# Patient Record
Sex: Female | Born: 1955 | Race: White | Hispanic: No | Marital: Married | Smoking: Current every day smoker
Health system: Southern US, Community
[De-identification: ages and names within clinical notes are randomized; demographics above are authoritative.]

## PROBLEM LIST (undated history)

## (undated) DIAGNOSIS — M1990 Unspecified osteoarthritis, unspecified site: Secondary | ICD-10-CM

## (undated) DIAGNOSIS — K219 Gastro-esophageal reflux disease without esophagitis: Secondary | ICD-10-CM

## (undated) DIAGNOSIS — Z860100 Personal history of colon polyps, unspecified: Secondary | ICD-10-CM

## (undated) DIAGNOSIS — I341 Nonrheumatic mitral (valve) prolapse: Secondary | ICD-10-CM

## (undated) DIAGNOSIS — T7840XA Allergy, unspecified, initial encounter: Secondary | ICD-10-CM

## (undated) DIAGNOSIS — Z720 Tobacco use: Secondary | ICD-10-CM

## (undated) DIAGNOSIS — I10 Essential (primary) hypertension: Secondary | ICD-10-CM

## (undated) DIAGNOSIS — Z923 Personal history of irradiation: Secondary | ICD-10-CM

## (undated) DIAGNOSIS — F419 Anxiety disorder, unspecified: Secondary | ICD-10-CM

## (undated) DIAGNOSIS — K222 Esophageal obstruction: Secondary | ICD-10-CM

## (undated) DIAGNOSIS — J449 Chronic obstructive pulmonary disease, unspecified: Secondary | ICD-10-CM

## (undated) DIAGNOSIS — E785 Hyperlipidemia, unspecified: Secondary | ICD-10-CM

## (undated) HISTORY — DX: Tobacco use: Z72.0

## (undated) HISTORY — PX: MOHS SURGERY: SUR867

## (undated) HISTORY — DX: Nonrheumatic mitral (valve) prolapse: I34.1

## (undated) HISTORY — DX: Allergy, unspecified, initial encounter: T78.40XA

## (undated) HISTORY — DX: Personal history of colonic polyps: Z86.010

## (undated) HISTORY — DX: Anxiety disorder, unspecified: F41.9

## (undated) HISTORY — DX: Hyperlipidemia, unspecified: E78.5

## (undated) HISTORY — DX: Chronic obstructive pulmonary disease, unspecified: J44.9

## (undated) HISTORY — PX: ESOPHAGOGASTRODUODENOSCOPY: SHX1529

## (undated) HISTORY — DX: Esophageal obstruction: K22.2

## (undated) HISTORY — PX: COLONOSCOPY: SHX174

## (undated) HISTORY — PX: BREAST LUMPECTOMY: SHX2

## (undated) HISTORY — PX: OTHER SURGICAL HISTORY: SHX169

## (undated) HISTORY — DX: Gastro-esophageal reflux disease without esophagitis: K21.9

## (undated) HISTORY — DX: Personal history of colon polyps, unspecified: Z86.0100

## (undated) HISTORY — PX: SKIN GRAFT: SHX250

---

## 1998-07-15 HISTORY — PX: BREAST BIOPSY: SHX20

## 2000-01-15 ENCOUNTER — Encounter: Admission: RE | Admit: 2000-01-15 | Discharge: 2000-01-15 | Payer: Self-pay | Admitting: Family Medicine

## 2000-01-15 ENCOUNTER — Encounter: Payer: Self-pay | Admitting: Family Medicine

## 2000-12-16 ENCOUNTER — Other Ambulatory Visit: Admission: RE | Admit: 2000-12-16 | Discharge: 2000-12-16 | Payer: Self-pay | Admitting: Family Medicine

## 2000-12-17 ENCOUNTER — Encounter: Payer: Self-pay | Admitting: Family Medicine

## 2000-12-17 ENCOUNTER — Encounter: Admission: RE | Admit: 2000-12-17 | Discharge: 2000-12-17 | Payer: Self-pay | Admitting: Family Medicine

## 2000-12-22 ENCOUNTER — Encounter: Payer: Self-pay | Admitting: Internal Medicine

## 2001-06-24 ENCOUNTER — Encounter: Payer: Self-pay | Admitting: Family Medicine

## 2001-06-24 ENCOUNTER — Encounter: Admission: RE | Admit: 2001-06-24 | Discharge: 2001-06-24 | Payer: Self-pay | Admitting: Family Medicine

## 2001-12-24 ENCOUNTER — Encounter: Payer: Self-pay | Admitting: Family Medicine

## 2001-12-24 ENCOUNTER — Encounter: Admission: RE | Admit: 2001-12-24 | Discharge: 2001-12-24 | Payer: Self-pay | Admitting: Family Medicine

## 2002-12-17 ENCOUNTER — Encounter: Admission: RE | Admit: 2002-12-17 | Discharge: 2002-12-17 | Payer: Self-pay | Admitting: Family Medicine

## 2002-12-17 ENCOUNTER — Encounter: Payer: Self-pay | Admitting: Family Medicine

## 2003-07-04 ENCOUNTER — Encounter: Admission: RE | Admit: 2003-07-04 | Discharge: 2003-07-04 | Payer: Self-pay | Admitting: Family Medicine

## 2004-04-10 ENCOUNTER — Encounter: Admission: RE | Admit: 2004-04-10 | Discharge: 2004-04-10 | Payer: Self-pay | Admitting: Family Medicine

## 2005-06-11 ENCOUNTER — Encounter: Payer: Self-pay | Admitting: Internal Medicine

## 2005-06-14 ENCOUNTER — Inpatient Hospital Stay (HOSPITAL_COMMUNITY): Admission: EM | Admit: 2005-06-14 | Discharge: 2005-06-16 | Payer: Self-pay | Admitting: Emergency Medicine

## 2005-06-15 ENCOUNTER — Encounter (INDEPENDENT_AMBULATORY_CARE_PROVIDER_SITE_OTHER): Payer: Self-pay | Admitting: Specialist

## 2006-07-15 HISTORY — PX: APPENDECTOMY: SHX54

## 2006-11-26 ENCOUNTER — Ambulatory Visit: Payer: Self-pay | Admitting: Internal Medicine

## 2006-11-27 ENCOUNTER — Encounter: Payer: Self-pay | Admitting: Internal Medicine

## 2006-11-27 ENCOUNTER — Ambulatory Visit: Payer: Self-pay | Admitting: Internal Medicine

## 2007-02-13 ENCOUNTER — Emergency Department (HOSPITAL_COMMUNITY): Admission: EM | Admit: 2007-02-13 | Discharge: 2007-02-14 | Payer: Self-pay | Admitting: Emergency Medicine

## 2007-06-01 ENCOUNTER — Ambulatory Visit (HOSPITAL_COMMUNITY): Admission: RE | Admit: 2007-06-01 | Discharge: 2007-06-01 | Payer: Self-pay | Admitting: Internal Medicine

## 2007-08-21 ENCOUNTER — Encounter: Payer: Self-pay | Admitting: Internal Medicine

## 2007-10-12 LAB — HM MAMMOGRAPHY: HM Mammogram: NORMAL

## 2008-07-25 ENCOUNTER — Encounter: Payer: Self-pay | Admitting: Internal Medicine

## 2008-08-01 ENCOUNTER — Ambulatory Visit: Payer: Self-pay | Admitting: Internal Medicine

## 2008-08-19 ENCOUNTER — Ambulatory Visit: Payer: Self-pay | Admitting: Internal Medicine

## 2008-08-19 ENCOUNTER — Encounter: Payer: Self-pay | Admitting: Internal Medicine

## 2008-08-19 LAB — HM COLONOSCOPY

## 2008-08-22 ENCOUNTER — Encounter: Payer: Self-pay | Admitting: Internal Medicine

## 2008-09-30 ENCOUNTER — Ambulatory Visit: Payer: Self-pay | Admitting: Internal Medicine

## 2008-09-30 DIAGNOSIS — R498 Other voice and resonance disorders: Secondary | ICD-10-CM | POA: Insufficient documentation

## 2008-09-30 DIAGNOSIS — E785 Hyperlipidemia, unspecified: Secondary | ICD-10-CM | POA: Insufficient documentation

## 2008-09-30 DIAGNOSIS — M8580 Other specified disorders of bone density and structure, unspecified site: Secondary | ICD-10-CM

## 2008-09-30 DIAGNOSIS — F17200 Nicotine dependence, unspecified, uncomplicated: Secondary | ICD-10-CM | POA: Insufficient documentation

## 2008-09-30 DIAGNOSIS — M810 Age-related osteoporosis without current pathological fracture: Secondary | ICD-10-CM | POA: Insufficient documentation

## 2008-10-14 ENCOUNTER — Telehealth: Payer: Self-pay | Admitting: Family Medicine

## 2008-10-17 ENCOUNTER — Ambulatory Visit: Payer: Self-pay | Admitting: Internal Medicine

## 2008-10-21 ENCOUNTER — Emergency Department (HOSPITAL_COMMUNITY): Admission: EM | Admit: 2008-10-21 | Discharge: 2008-10-21 | Payer: Self-pay | Admitting: Emergency Medicine

## 2008-10-25 ENCOUNTER — Ambulatory Visit: Payer: Self-pay | Admitting: Family Medicine

## 2008-10-25 DIAGNOSIS — M79609 Pain in unspecified limb: Secondary | ICD-10-CM | POA: Insufficient documentation

## 2008-10-25 DIAGNOSIS — H8110 Benign paroxysmal vertigo, unspecified ear: Secondary | ICD-10-CM | POA: Insufficient documentation

## 2008-10-25 DIAGNOSIS — R030 Elevated blood-pressure reading, without diagnosis of hypertension: Secondary | ICD-10-CM | POA: Insufficient documentation

## 2008-11-03 ENCOUNTER — Encounter: Payer: Self-pay | Admitting: Internal Medicine

## 2008-11-11 ENCOUNTER — Ambulatory Visit: Payer: Self-pay | Admitting: Internal Medicine

## 2008-11-11 DIAGNOSIS — Z86010 Personal history of colonic polyps: Secondary | ICD-10-CM | POA: Insufficient documentation

## 2008-11-11 DIAGNOSIS — R209 Unspecified disturbances of skin sensation: Secondary | ICD-10-CM | POA: Insufficient documentation

## 2008-11-22 ENCOUNTER — Encounter: Payer: Self-pay | Admitting: Internal Medicine

## 2008-12-14 ENCOUNTER — Telehealth: Payer: Self-pay | Admitting: Internal Medicine

## 2008-12-20 ENCOUNTER — Ambulatory Visit: Payer: Self-pay | Admitting: Internal Medicine

## 2008-12-20 LAB — CONVERTED CEMR LAB
ALT: 34 units/L (ref 0–35)
BUN: 12 mg/dL (ref 6–23)
Chloride: 110 meq/L (ref 96–112)
GFR calc non Af Amer: 111.73 mL/min (ref >60)
Glucose, Bld: 110 mg/dL — ABNORMAL HIGH (ref 70–99)
HDL: 33.1 mg/dL — ABNORMAL LOW (ref >39.00)
LDL Cholesterol: 90 mg/dL (ref 0–99)
Potassium: 3.8 meq/L (ref 3.5–5.1)
Sodium: 144 meq/L (ref 135–145)
TSH: 1.53 microintl units/mL (ref 0.35–5.50)
Total Bilirubin: 0.8 mg/dL (ref 0.3–1.2)
Total Protein: 7.3 g/dL (ref 6.0–8.3)
VLDL: 23 mg/dL (ref 0.0–40.0)
Vit D, 25-Hydroxy: 29 ng/mL — ABNORMAL LOW (ref 30–89)

## 2008-12-23 ENCOUNTER — Ambulatory Visit: Payer: Self-pay | Admitting: Internal Medicine

## 2008-12-23 DIAGNOSIS — M542 Cervicalgia: Secondary | ICD-10-CM | POA: Insufficient documentation

## 2008-12-28 ENCOUNTER — Encounter: Admission: RE | Admit: 2008-12-28 | Discharge: 2008-12-28 | Payer: Self-pay | Admitting: Internal Medicine

## 2008-12-29 ENCOUNTER — Telehealth: Payer: Self-pay | Admitting: Internal Medicine

## 2008-12-29 DIAGNOSIS — R9409 Abnormal results of other function studies of central nervous system: Secondary | ICD-10-CM | POA: Insufficient documentation

## 2008-12-31 ENCOUNTER — Encounter: Admission: RE | Admit: 2008-12-31 | Discharge: 2008-12-31 | Payer: Self-pay | Admitting: Internal Medicine

## 2009-01-02 ENCOUNTER — Telehealth: Payer: Self-pay | Admitting: Internal Medicine

## 2009-06-20 ENCOUNTER — Ambulatory Visit: Payer: Self-pay | Admitting: Family

## 2009-07-16 ENCOUNTER — Telehealth: Payer: Self-pay | Admitting: Family Medicine

## 2009-07-19 ENCOUNTER — Telehealth: Payer: Self-pay | Admitting: Internal Medicine

## 2009-08-02 ENCOUNTER — Telehealth: Payer: Self-pay | Admitting: Internal Medicine

## 2009-08-24 ENCOUNTER — Ambulatory Visit: Payer: Self-pay | Admitting: Internal Medicine

## 2009-08-24 LAB — CONVERTED CEMR LAB
Albumin: 3.7 g/dL (ref 3.5–5.2)
Basophils Absolute: 0.1 10*3/uL (ref 0.0–0.1)
Calcium: 9.3 mg/dL (ref 8.4–10.5)
Chloride: 107 meq/L (ref 96–112)
Cholesterol: 153 mg/dL (ref 0–200)
Creatinine, Ser: 0.7 mg/dL (ref 0.4–1.2)
Eosinophils Absolute: 0.2 10*3/uL (ref 0.0–0.7)
GGT: 31 units/L (ref 7–51)
HCT: 42.4 % (ref 36.0–46.0)
HDL: 38.3 mg/dL — ABNORMAL LOW (ref >39.00)
Hemoglobin: 13.9 g/dL (ref 12.0–15.0)
Lymphs Abs: 1.8 10*3/uL (ref 0.7–4.0)
MCHC: 32.8 g/dL (ref 30.0–36.0)
Monocytes Absolute: 0.5 10*3/uL (ref 0.1–1.0)
Neutro Abs: 4.9 10*3/uL (ref 1.4–7.7)
RDW: 13.3 % (ref 11.5–14.6)
Sodium: 141 meq/L (ref 135–145)
TSH: 1.9 microintl units/mL (ref 0.35–5.50)
Triglycerides: 139 mg/dL (ref 0.0–149.0)
Vitamin B-12: 622 pg/mL (ref 211–911)

## 2009-08-28 ENCOUNTER — Other Ambulatory Visit: Admission: RE | Admit: 2009-08-28 | Discharge: 2009-08-28 | Payer: Self-pay | Admitting: Internal Medicine

## 2009-08-28 ENCOUNTER — Ambulatory Visit: Payer: Self-pay | Admitting: Internal Medicine

## 2009-08-28 DIAGNOSIS — K219 Gastro-esophageal reflux disease without esophagitis: Secondary | ICD-10-CM | POA: Insufficient documentation

## 2009-08-28 DIAGNOSIS — R7309 Other abnormal glucose: Secondary | ICD-10-CM | POA: Insufficient documentation

## 2009-08-28 DIAGNOSIS — R748 Abnormal levels of other serum enzymes: Secondary | ICD-10-CM | POA: Insufficient documentation

## 2009-08-28 LAB — CONVERTED CEMR LAB: Pap Smear: NEGATIVE

## 2009-08-31 ENCOUNTER — Encounter: Payer: Self-pay | Admitting: Internal Medicine

## 2009-09-01 ENCOUNTER — Telehealth: Payer: Self-pay | Admitting: Internal Medicine

## 2009-09-01 ENCOUNTER — Encounter: Admission: RE | Admit: 2009-09-01 | Discharge: 2009-09-01 | Payer: Self-pay | Admitting: Internal Medicine

## 2009-09-06 ENCOUNTER — Telehealth: Payer: Self-pay | Admitting: Internal Medicine

## 2009-09-13 ENCOUNTER — Ambulatory Visit: Payer: Self-pay | Admitting: Internal Medicine

## 2009-09-13 LAB — CONVERTED CEMR LAB: Calcium: 9.5 mg/dL (ref 8.4–10.5)

## 2009-09-14 ENCOUNTER — Telehealth: Payer: Self-pay | Admitting: Internal Medicine

## 2009-09-14 ENCOUNTER — Encounter (HOSPITAL_COMMUNITY): Admission: RE | Admit: 2009-09-14 | Discharge: 2009-11-14 | Payer: Self-pay | Admitting: Internal Medicine

## 2009-09-19 ENCOUNTER — Telehealth: Payer: Self-pay | Admitting: Internal Medicine

## 2010-01-03 ENCOUNTER — Telehealth: Payer: Self-pay | Admitting: Internal Medicine

## 2010-01-04 ENCOUNTER — Telehealth: Payer: Self-pay | Admitting: Internal Medicine

## 2010-01-05 ENCOUNTER — Telehealth: Payer: Self-pay | Admitting: Internal Medicine

## 2010-01-08 ENCOUNTER — Encounter: Payer: Self-pay | Admitting: Internal Medicine

## 2010-01-08 ENCOUNTER — Telehealth: Payer: Self-pay | Admitting: Internal Medicine

## 2010-01-09 ENCOUNTER — Encounter: Payer: Self-pay | Admitting: Internal Medicine

## 2010-01-10 ENCOUNTER — Encounter: Payer: Self-pay | Admitting: Internal Medicine

## 2010-01-18 ENCOUNTER — Telehealth: Payer: Self-pay | Admitting: Internal Medicine

## 2010-02-07 ENCOUNTER — Ambulatory Visit: Payer: Self-pay | Admitting: Internal Medicine

## 2010-02-07 DIAGNOSIS — R059 Cough, unspecified: Secondary | ICD-10-CM | POA: Insufficient documentation

## 2010-02-07 DIAGNOSIS — M25569 Pain in unspecified knee: Secondary | ICD-10-CM | POA: Insufficient documentation

## 2010-02-07 DIAGNOSIS — R05 Cough: Secondary | ICD-10-CM

## 2010-04-03 ENCOUNTER — Telehealth: Payer: Self-pay | Admitting: Internal Medicine

## 2010-04-09 ENCOUNTER — Ambulatory Visit: Payer: Self-pay | Admitting: Internal Medicine

## 2010-07-27 ENCOUNTER — Telehealth: Payer: Self-pay | Admitting: Internal Medicine

## 2010-08-04 ENCOUNTER — Encounter: Payer: Self-pay | Admitting: Internal Medicine

## 2010-08-14 NOTE — Assessment & Plan Note (Signed)
Summary: cpx with pap- jr   Vital Signs:  Patient profile:   55 year old female Menstrual status:  postmenopausal Weight:      208.25 pounds BMI:     34.78 O2 Sat:      96 % on Room air Temp:     98.2 degrees F oral Pulse rate:   89 / minute Pulse rhythm:   regular Resp:     18 per minute BP sitting:   130 / 80  (right arm) Cuff size:   large  Vitals Entered By: Glendell Docker CMA (August 28, 2009 2:32 PM)  O2 Flow:  Room air  Primary Care Provider:  D. Thomos Lemons DO  CC:  CPX.  History of Present Illness: CPX  54 y/o white female with hx of  GERD Hx of mitral valve prolapse Hyperlipidemia Abnormal Glucose    Tob abuse - tried chantix (caused aggression) Osteoporosis  anxiety  Hx of esophagel stricture - Dr. Lina Sar Colonic polyps, hx of  for routine cpx and PAP and Pelvic It has been greater than 1 yr since last PAP.   She notes some PAP abnormality " it turned out to be normal" she is due for her mammogram - hx of benign fibrocystic breast dz she performs self breast exams.  she denies palpable mass.  no nipple discharge no vaginal symptoms  she is concerned she is too over weight - fasting blood sugar elevated at 116  stll smoking  osteoporosis - only taking calcium and vitamin D.   she is afraid of ReClast side effects.   she reads blogs online  anxiety - she is taking celexa - stable  hyperlipidemia - taking meds.  lab results reviewed   Preventive Screening-Counseling & Management  Alcohol-Tobacco     Smoking Status: current     Smoking Cessation Counseling: yes     Smoke Cessation Stage: precontemplative     Packs/Day: 1.0  Allergies: 1)  Penicillin  Past History:  Past Medical History: GERD Hx of mitral valve prolapse Hyperlipidemia Abnormal Glucose    Tob abuse - tried chantix (caused aggression) Osteoporosis  anxiety  Hx of esophagel stricture - Dr. 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         mild wt gain, no vaginal symptoms no HEENT complaints All other systems were reviewed and were negative.   Physical Exam  General:  alert, well-developed, and well-nourished.   Head:  normocephalic and atraumatic.   Eyes:  pupils equal, pupils round, and pupils reactive to light.   Ears:  R ear normal and L ear normal.   Mouth:  upper dental plate,  pharynx pink and moist.   Neck:  supple, no masses, and no carotid bruits.   Breasts:  No mass, nodules, thickening, tenderness, bulging, retraction, inflamation, nipple discharge or skin changes noted.   Lungs:  normal respiratory effort and normal breath  sounds.   Heart:  normal rate, regular rhythm, no murmur, and no gallop.   Abdomen:  protuberant, non-tender, no masses, no hepatomegaly, and no splenomegaly.   Genitalia:  normal introitus, no external lesions, mucosa pink and moist, and no vaginal or cervical lesions.  unable to appreciated adnexal mass Pulses:  dorsalis pedis and posterior tibial pulses are full and equal bilaterally Extremities:  No lower extremity edema  Neurologic:  cranial nerves II-XII intact and gait normal.   Psych:  normally interactive, good eye contact, not anxious appearing, and not depressed appearing.     Impression & Recommendations:  Problem # 1:  HEALTH MAINTENANCE EXAM (ICD-V70.0)  Orders: Mammogram (Screening) (Mammo) Reviewed adult health  maintenance protocols.  We discussed wt loss stategies.  she is not interested in limiting her calories.  she is concerned she is overweight but feels generally "happy".  Same attitude toward smoking cessation.  PAP and Pelvic performed Mammogram: normal (10/12/2007) Pap smear: normal (08/01/2008) Colonoscopy: Location:  [facility].   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  she has had similar issue in the past.  her prev PCP obtained bone scan.  consider check GGTP  Orders: Radiology Referral (Radiology) Radiology Referral (Radiology)  Problem # 3:  DIABETES MELLITUS, TYPE II, BORDERLINE (ICD-790.29) we discussed potential progressing to diabetes.  she likes her sweets.   I urged dietary changes and start metformin.  Her updated medication list for this problem includes:    Metformin Hcl 500 Mg Tabs (Metformin hcl) .Marland Kitchen... 1/2 by mouth two times a day x 7 days, then one by mouth bid  Labs Reviewed: Creat: 0.7 (08/24/2009)     Problem # 4:  TOBACCO ABUSE (ICD-305.1)  Encouraged smoking cessation and discussed different methods for smoking cessation.   Problem # 5:  OSTEOPOROSIS (ICD-733.00) I recommended pt use ReClast considering hx of esophageal stricture.   She does not want to proceed with ReClast infusions due to fear of side effects.  Problem # 6:  GERD (ICD-530.81) she has hx of esophageal stricture.  she uses PPI regularly.  We discussed adding zantac at bedtime vs using carafate.  Her updated medication list for this problem includes:    Carafate 1 Gm/34ml Susp (Sucralfate) .Marland Kitchen... Take 2 teaspoons by mouth 1 hour before  meal    Nexium 40 Mg Cpdr  (Esomeprazole magnesium) .Marland Kitchen... Take 1 tablet by mouth two times a day  Problem # 7:  ANXIETY (ICD-300.00) Assessment: Unchanged stable.  Maintain current medication regimen.  Her updated medication list for this problem includes:    Celexa 10 Mg Tabs (Citalopram hydrobromide) .Marland Kitchen... Take 1 tablet by mouth once a day  Complete Medication List: 1)  Carafate 1 Gm/87ml Susp (Sucralfate) .... Take 2 teaspoons by mouth 1 hour before  meal 2)  Lipitor 80 Mg Tabs (Atorvastatin calcium) .... Take 1 tablet by mouth once a day 3)  Nexium 40 Mg Cpdr (Esomeprazole magnesium) .... Take 1 tablet by mouth two times a day 4)  Celexa 10 Mg Tabs (Citalopram hydrobromide) .... Take 1 tablet by mouth once a day 5)  Calcium Carbonate 600 Mg Tabs (Calcium carbonate) .... Take 1 tablet by mouth two times a day 6)  Metformin Hcl  500 Mg Tabs (Metformin hcl) .... 1/2 by mouth two times a day x 7 days, then one by mouth bid  Patient Instructions: 1)  Please schedule a follow-up appointment in 2 months. Prescriptions: METFORMIN HCL 500 MG TABS (METFORMIN HCL) 1/2 by mouth two times a day x 7 days, then one by mouth bid  #60 x 3   Entered and Authorized by:   D. Thomos Lemons DO   Signed by:   D. Thomos Lemons DO on 08/28/2009   Method used:   [facility] [address] [id number]       [address]       [place], [place]  [address]       Ph: [phone]       Fax: [phone]   RxID:   [id number] CELEXA 10 MG TABS (CITALOPRAM HYDROBROMIDE) Take 1 tablet by mouth once a day  #30 x 5   Entered by:   Glendell Docker CMA   Authorized by:   D. Thomos Lemons DO   Signed by:   Glendell Docker CMA on 08/28/2009   Method used:   [facility]        [facility] [address] [id number]       [address]       [place], [place]  [address]       Ph: [phone]       Fax: [phone]   RxID:   [id number] NEXIUM 40 MG CPDR (ESOMEPRAZOLE  MAGNESIUM) Take 1 tablet by mouth two times a day  #60 x 5   Entered by:   Glendell Docker CMA   Authorized by:   D. Thomos Lemons DO   Signed by:   Glendell Docker CMA on 08/28/2009   Method used:   [facility]        [id number]       [address]       [place], [place]  [address]       Ph: [phone]       Fax: [phone]   RxID:   [id number] LIPITOR 80 MG TABS (ATORVASTATIN CALCIUM) Take 1 tablet by mouth once a day  #30 x 5   Entered by:   Glendell Docker CMA   Authorized by:   D. Thomos Lemons DO   Signed by:   Glendell Docker CMA on 08/28/2009   Method used:   [facility]        [facility] [address] [id number]       [address]       [place], [place]  [address]       Ph: [phone]       Fax: [phone]   RxID:   [id number]       Current Allergies (reviewed today): PENICILLIN

## 2010-08-14 NOTE — Letter (Signed)
   [place] [facility] [address] [place], [place]  [address]  [place] Phone: [phone]      August 31, 2009   TORIN WHISNER [address] [place], [place] [address]  RE:  LAB RESULTS  Dear  Ms. Police,  The following is an interpretation of your most recent lab tests.  Please take note of any instructions provided or changes to medications that have resulted from your lab work.  Pap Smear: normal - follow up in 2 years         Sincerely Yours,    Dr. Thomos Lemons

## 2010-08-14 NOTE — Progress Notes (Signed)
  Phone Note Call from Patient Call back at Home Phone [phone]   Caller: Patient Summary of Call: Patient calls with c/o nasal congestion, bil ear pressure, and facial pressure.  Similar sxs treated with Levaquin few weeks ago.  No fever and rare cough.  Pt has taken sudafed and Tylenol without relief.  She will add Mucinex and f/u with primary this week if no better. Initial call taken by: Evelena Peat MD,  July 16, 2009 9:43 AM

## 2010-08-14 NOTE — Progress Notes (Signed)
Summary: samples   Phone Note Call from Patient Call back at [phone]   Caller: Patient Call For: [name] [name] Reason for Call: Talk to Nurse Summary of Call: Patient woud like some Nexium samples please call pt. Initial call taken by: Tawni Levy,  January 05, 2010 11:18 AM  Follow-up for Phone Call        No. Patient already has a request in for samples at Dr Olegario Messier office. Also, Dr Artist Pais has been prescribing prescription for patient. We have not seen patient for esophageal problems since 2008. I have left the above information for the patient on her voicemail. Follow-up by: Lamona Curl CMA Duncan Dull),  January 05, 2010 12:23 PM

## 2010-08-14 NOTE — Progress Notes (Signed)
Summary: Celexa refill  Phone Note Refill Request Call back at Home Phone [phone] Message from:  Patient on April 03, 2010 2:02 PM  Refills Requested: Medication #1:  CELEXA 10 MG TABS Take 1 tablet by mouth once a day   Dosage confirmed as above?Dosage Confirmed   Brand Name Necessary? No   Supply Requested: [facility] year [facility] [address] of   Method Requested: Electronic    Prescriptions: CELEXA 10 MG TABS (CITALOPRAM HYDROBROMIDE) Take 1 tablet by mouth once a day  #30 x 2   Entered by:   Mervin Kung CMA (AAMA)   Authorized by:   D. Thomos Lemons DO   Signed by:   Mervin Kung CMA (AAMA) on 04/03/2010   Method used:   [facility]        [facility] [address] [id number]       [address]       [place], [place]  [address]       Ph: [phone]       Fax: [phone]   RxID:   [id number]

## 2010-08-14 NOTE — Progress Notes (Signed)
Summary: form for prior auth from [employer] to [employer] placed by: marj Call placed to: [employer]bs of [place] Summary of Call: called Ezequiel Essex  of [place]  policy [facility] [phone] for prior authorization form for Nexium 40 mg and Lipitor  80 mg tablets   They are to fax form for prior auth  forms received given to Darlene  Initial call taken by: Roselle Locus,  January 05, 2010 10:39 AM

## 2010-08-14 NOTE — Progress Notes (Signed)
Summary: Medication Refills  Phone Note Call from Patient Call back at [phone]   Caller: Patient Call For: D. Thomos Lemons DO Summary of Call: patient called and left voice message stating she lost her job and her insurance will be running out. She would like to know if Dr Artist Pais would authorize refills on her Lipitor and Nexium for a 90 day supply Initial call taken by: Glendell Docker CMA,  January 03, 2010 2:38 PM  Follow-up for Phone Call        ok for 90 day supply and 3 refills for requested meds Follow-up by: D. Thomos Lemons DO,  January 04, 2010 5:37 PM  Additional Follow-up for Phone Call Additional follow up Details #1::        Phone Call Completed, requested rx's sent to pharmacy Additional Follow-up by: Glendell Docker CMA,  January 05, 2010 2:02 PM    New/Updated Medications: METFORMIN HCL 500 MG TABS (METFORMIN HCL) Take 1 tablet by mouth two times a day Prescriptions: NEXIUM 40 MG CPDR (ESOMEPRAZOLE MAGNESIUM) Take 1 tablet by mouth two times a day  #180 x 3   Entered by:   Glendell Docker CMA   Authorized by:   D. Thomos Lemons DO   Signed by:   Glendell Docker CMA on 01/05/2010   Method used:   [facility]        [facility] [address] [id number]       [address]       [place], [place]  [address]       Ph: [phone]       Fax: [phone]   RxID:   [id number] LIPITOR 80 MG TABS (ATORVASTATIN CALCIUM) Take 1 tablet by mouth once a day  #90 x 3   Entered by:   Glendell Docker CMA   Authorized by:   D. Thomos Lemons DO   Signed by:   Glendell Docker CMA on 01/05/2010   Method used:   [facility]        [facility] [address] [id number]       [address]       [place], [place]  [address]       Ph: [phone]       Fax: [phone]   RxID:   [id number]

## 2010-08-14 NOTE — Progress Notes (Signed)
Summary: Medication Prior Authorization  Phone Note Outgoing Call   Call placed by: Glendell Docker CMA,  January 08, 2010 1:53 PM Summary of Call: patient called and left voice message requesting prior authorization on Nexium and Lipitor. She states she has checked with the pharmacy and they do not have any medications for hert. Call was placed to number provided the pharmacist for Prior authorization [phone]. spoke with csr who advised for prior authorization on Nexium, I had to call [phone], and for the Lipitor a form had to be printed out and completed from http://www.lucero.net/ and submitted via fax.  Call was placed for prior authorization for Nexium and has been approved. Form for Lipitor has been completed and is awaiting physcian review and signature. Initial call taken by: Glendell Docker CMA,  January 08, 2010 1:59 PM  Follow-up for Phone Call        Pt left voice message stating that the prior auth for Nexium was done for once daily dosing and she takes it two times a day. Also would like Lipitor P.A. handled ASAP in order to still get it covered under the ins.  Pt requests return call re: status.  Mervin Kung CMA  January 09, 2010 10:21 AM   Additional Follow-up for Phone Call Additional follow up Details #1::        Lipitor form faxed and awaiting approval status Additional Follow-up by: Glendell Docker CMA,  January 09, 2010 11:01 AM    Additional Follow-up for Phone Call Additional follow up Details #2::    patient is calling back stating that Nexium prior auth was for 1 pill per day and she takes 2.  She needs the form refaxed to [facility] [place] stating that she takes 2 per day and the rep she is working with at [employer] says she will approve the request as soon as it is received.  Roselle Locus  January 09, 2010 2:56 PM patient called again says [employer] says the Lipitor prior Berkley Harvey form has not been received by them.  Can someone call the [facility] office and see if they have some samples of  the Nerxium and Lipitor she could have till this is resolved. Roselle Locus  January 09, 2010 3:34 PM Darlene-pls call pt this morning, patient cannot eat or sleep without Nexium, has been waiting a week for meds. Pls advise Diane Tomerlin  January 10, 2010 9:14 AM   Additional Follow-up for Phone Call Additional follow up Details #3:: Details for Additional Follow-up Action Taken: spoke with representative Marquestta regarding the Nexium dosing. She was informed the dosing for patienti has been changed to two times a day, patient was informed that medication has been phoned to pharmacy and and insurance company stated they did not recieve the fax for the Lipitor, She was informed the Prior Authorization form for Lipiro would be refaxed.   Form for Lipitor retrieved from medical records and re-faxed to [facility] for Lipitor. Awaiting approval status. Additional Follow-up by: Glendell Docker CMA,  January 10, 2010 3:52 PM  Faxed receieved from [facility] stating Lipitor has been denied Glendell Docker CMA  January 10, 2010 4:44 PM

## 2010-08-14 NOTE — Progress Notes (Signed)
Summary: upset patient needs a return call  Phone Note Call from Patient   Summary of Call: Pt.called very upset that she had blood work done before her Reclast infusion and now is told by Myriam Jacobson that she needs to have her reclast done this week or her labs expires.   Patient would like a call back and all this be explained. Call back at [phone] as soon as possible Initial call taken by: Michaelle Copas,  September 19, 2009 11:52 AM  Follow-up for Phone Call        call returned to patient clarification was given to patient about blood work. Patient was upset that she would have to be stuck again. She was advised that the blood work could be added for testing needed for reclast infusion. If lab work was not able to be added she was informed that she would recieve a call back. She should go ahead and plan for her Infusion. She was reassured and is okay with lab work plan. Follow-up by: Glendell Docker CMA,  September 19, 2009 5:09 PM

## 2010-08-14 NOTE — Progress Notes (Signed)
Summary: Generic Alternatives  Phone Note Call from Patient Call back at [phone]   Caller: Patient Call For: D. Thomos Lemons DO Summary of Call: patient called stating she was informed that her insurance company would not cover her Lipitor. She was informed prior authorization paperwork has been completed and her insurance company will not cover the cost of the Lipitor. She was advised to contact her insurance company to find out what they will cover and Dr Artist Pais will determined which medication is appropiate and send a new rx to her pharmacy. She has verbalized understanding and will contact her insurance company to see what the alternatives are Initial call taken by: Glendell Docker CMA,  January 18, 2010 2:03 PM  Follow-up for Phone Call        patient returned call and left voice message stating medication alternatives for Lipitor are Lovastatin, Pravastatin, and Simvastatin.  Follow-up by: Glendell Docker CMA,  January 18, 2010 2:27 PM  Additional Follow-up for Phone Call Additional follow up Details #1::        patient informed of medication change. Additional Follow-up by: Glendell Docker CMA,  January 23, 2010 1:51 PM    New/Updated Medications: PRAVASTATIN SODIUM 40 MG TABS (PRAVASTATIN SODIUM) one by mouth once daily Prescriptions: PRAVASTATIN SODIUM 40 MG TABS (PRAVASTATIN SODIUM) one by mouth once daily  #90 x 1   Entered and Authorized by:   D. Thomos Lemons DO   Signed by:   D. Thomos Lemons DO on 01/23/2010   Method used:   [facility]        [facility] [address] [id number]       [address]       [place], [place]  [address]       Ph: [phone]       Fax: [phone]   RxID:   [id number]

## 2010-08-14 NOTE — Progress Notes (Signed)
Summary: Medication Samples  Phone Note Call from Patient Call back at [phone]   Caller: Patient Call For: Jaelynn Pozo  Summary of Call: patient has lost her job  her new insurance is requiring a prior auth on the Nexium and the lipitor.  Could you give her some samples until the insurance company authorizes these meds.   Initial call taken by: Roselle Locus,  January 04, 2010 2:53 PM  Follow-up for Phone Call        call returned to patient she has been informed  there are no samples for Lipitor and Nexium in the office  Follow-up by: Glendell Docker CMA,  January 05, 2010 2:35 PM

## 2010-08-14 NOTE — Progress Notes (Signed)
Summary: Rx & Reclast  Phone Note Call from Patient   Caller: Patient Summary of Call: Are you going to call in carafate for this patient?  [facility] [address]..... Pt. is also asking if you are going to set up reclast for her? Call pt. back [phone] Initial call taken by: Michaelle Copas,  September 01, 2009 4:28 PM  Follow-up for Phone Call        she will Mg and Phos level before reclast infusion Follow-up by: D. Thomos Lemons DO,  September 01, 2009 4:34 PM  Additional Follow-up for Phone Call Additional follow up Details #1::        patient advised of blood work needed prior to Reclast infusion. She state she would like to have her labs drawn in [place] at [place]. She is scheduled for Bone Scan on March 3 and would like to have her infusion scheduled the 2nd week in March. Labs for reclast scheduled for 3/3 Additional Follow-up by: Glendell Docker CMA,  September 01, 2009 4:55 PM    Prescriptions: CARAFATE 1 GM/10ML SUSP (SUCRALFATE) Take 2 teaspoons by mouth 1 hour before  meal  #1 month x 5   Entered and Authorized by:   D. Thomos Lemons DO   Signed by:   D. Thomos Lemons DO on 09/01/2009   Method used:   [facility]        [facility] [id number]       [address]       [place], [place]  [address]       Ph: [phone]       Fax: [phone]   RxID:   [id number]

## 2010-08-14 NOTE — Medication Information (Signed)
Summary: Prior Authorization for Lipitor/BCBS  Prior Authorization for Lipitor/BCBS   Imported By: Lanelle Bal 01/18/2010 12:04:48  _____________________________________________________________________  External Attachment:    Type:   Image     Comment:   External Document

## 2010-08-14 NOTE — Progress Notes (Signed)
Summary: CAN SHE BE WORKED IN TODAY   Phone Note Call from Patient Call back at [employer] [phone]   Caller: PT LIVE Call For: Majestic Brister  Summary of Call: sHE WAS IN TO SEE MELISSA TWO OR THREE WEEKS AGO AND THEN SHE WENT TO URGENT CARE ON SUNDAY.  HER CHEST IS CONGESTED AND SHE HAS COUGHED SO MUCH HER SIDES AND CHEST ARE SORE.  SHE WOULD LIKE TO SEE DR Britta Louth TODAY IF POSSIBLE  Initial call taken by: Roselle Locus,  July 19, 2009 8:24 AM  Follow-up for Phone Call        pt seen at [facility] urgent care.  pt advised to f/u in 2 weeks Follow-up by: D. Thomos Lemons DO,  July 19, 2009 12:34 PM

## 2010-08-14 NOTE — Progress Notes (Signed)
Summary: Nexium  Refill  Phone Note Refill Request Message from:  Fax from Pharmacy on August 02, 2009 10:58 AM  Refills Requested: Medication #1:  NEXIUM 40 MG CPDR Take 1 tablet by mouth two times a day   Dosage confirmed as above?Dosage Confirmed   Brand Name Necessary? No   Supply Requested: 1 month   Last Refilled: 06/28/2009  Method Requested: Electronic Next Appointment Scheduled: none Initial call taken by: Roselle Locus,  August 02, 2009 10:59 AM    Prescriptions: NEXIUM 40 MG CPDR (ESOMEPRAZOLE MAGNESIUM) Take 1 tablet by mouth two times a day  #60 x 1   Entered by:   Glendell Docker CMA   Authorized by:   D. Thomos Lemons DO   Signed by:   Glendell Docker CMA on 08/02/2009   Method used:   [facility]        [facility] [address] [id number]       [address]       [place], [place]  [address]       Ph: [phone]       Fax: [phone]   RxID:   [id number]

## 2010-08-14 NOTE — Progress Notes (Signed)
  Phone Note Outgoing Call   Summary of Call: call pt - bone scan is normal Initial call taken by: D. Thomos Lemons DO,  September 14, 2009 5:58 PM  Follow-up for Phone Call        Called pt. to let her know bone scan is normal. Pt wants to know if her labs came back okay to go ahead with the reclast infusion.? Call patient back and inform her about lab results before infusion. Thank you.Michaelle Copas  September 15, 2009 8:51 AM   Additional Follow-up for Phone Call Additional follow up Details #1::        yes, labs are ok.  proceed with reclast infusion Additional Follow-up by: D. Thomos Lemons DO,  September 15, 2009 10:41 AM    Additional Follow-up for Phone Call Additional follow up Details #2::    informed pt. that labs are okay and to proceed with reclast infusion Follow-up by: Michaelle Copas,  September 15, 2009 10:56 AM

## 2010-08-14 NOTE — Progress Notes (Signed)
Summary: Reclast Blood Work  Phone Note Outgoing Call   Call placed by: Glendell Docker CMA,  September 06, 2009 2:58 PM Call placed to: Patient Summary of Call: attemtped to contact patient at  [phone] regarding  reclast infusiion, no answer. Detailed voice message left for patient to call and arrange for blood work prior to reclast infusion. Initial call taken by: Glendell Docker CMA,  September 06, 2009 2:59 PM  Follow-up for Phone Call        call placed to patient at [phone] no answer, voice message left to call regarding blood work for reclast infusion Follow-up by: Glendell Docker CMA,  September 08, 2009 10:21 AM  Additional Follow-up for Phone Call Additional follow up Details #1::        No return call from patient  Additional Follow-up by: Glendell Docker CMA,  September 08, 2009 5:00 PM

## 2010-08-14 NOTE — Assessment & Plan Note (Signed)
Summary: knee hurts/mhf   Vital Signs:  Patient profile:   55 year old female Menstrual status:  postmenopausal Weight:      209.25 pounds BMI:     34.95 O2 Sat:      95 % on Room air Temp:     98.7 degrees F oral Pulse rate:   77 / minute Pulse rhythm:   regular Resp:     18 per minute BP sitting:   122 / 70  (right arm) Cuff size:   large  Vitals Entered By: Glendell Docker CMA (February 07, 2010 9:21 AM)  O2 Flow:  Room air CC: Rm 3-  Is Patient Diabetic? No Pain Assessment Patient in pain? yes     Location: knee Intensity: 5 Type: Stabbing Onset of pain  With activity   Primary Care Provider:  DThomos Lemons DO  CC:  Rm 3- .  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   Lungs:  normal respiratory effort and normal breath sounds.   Heart:  normal rate, regular rhythm, and no gallop.   Msk:  lateral left knee tenderness, knee joint stable   Impression & Recommendations:  Problem # 1:  KNEE PAIN, LEFT (ICD-719.46) 55 y/o with left knee pain.  DJD vs meniscal injury refer to ortho for further eval  and tx Orders: Orthopedic Referral (Ortho)  Problem # 2:  COUGH, CHRONIC (ICD-786.2) chronic cough in smoker.   arrange PFTs smoking cessation encouraged  Orders: Pulmonary Referral (Pulmonary)  Problem # 3:  DIABETES MELLITUS, TYPE II, BORDERLINE (ICD-790.29) metformin caused increase in heartburn.  Pt counseled on diet and exercise.  The following medications were removed from the medication list:    Metformin Hcl 500 Mg Tabs (Metformin hcl) .Marland Kitchen... Take 1 tablet by mouth two times a day  Complete Medication List: 1)  Carafate 1 Gm/1ml Susp (Sucralfate) .... Take 2 teaspoons by mouth 1 hour before  meal 2)  Pravastatin Sodium 40 Mg Tabs (Pravastatin sodium) .... One by mouth once daily 3)  Nexium 40 Mg Cpdr (Esomeprazole magnesium) .... Take 1 tablet by mouth two times a day 4)  Celexa 10 Mg Tabs (Citalopram hydrobromide) .... Take 1 tablet by mouth once a day 5)  Calcium Carbonate 600 Mg Tabs (Calcium carbonate) .... Take 1 tablet by mouth two times a day 6)  Proventil Hfa 108 (90 Base) Mcg/act Aers (Albuterol sulfate) .... 2 puffs qid as needed  Patient Instructions: 1)  Our office will contact you re:  orthopedic referral 2)  Please schedule a follow-up appointment in 2 months.  Prescriptions: PROVENTIL HFA 108 (90 BASE) MCG/ACT AERS (ALBUTEROL SULFATE) 2 puffs qid as needed  #1 x 3   Entered and Authorized by:   D. Thomos Lemons DO   Signed by:   D. Thomos Lemons DO on 02/07/2010   Method used:   [facility]        [facility]  [address] [facility] [address] [id number]       [address]       [place], [place]  [address]       Ph: [phone]       Fax: [phone]   RxID:   [id number]   Current Allergies (reviewed today): PENICILLIN

## 2010-08-14 NOTE — Medication Information (Signed)
Summary: Approval for Nexium/BCBS  Approval for Nexium/[employer]BS   Imported By: Lanelle Bal 01/18/2010 12:31:41  _____________________________________________________________________  External Attachment:    Type:   Image     Comment:   External Document

## 2010-08-14 NOTE — Medication Information (Signed)
Summary: Denial for Lipitor/BCBS  Denial for Lipitor/BCBS   Imported By: Lanelle Bal 01/18/2010 12:33:30  _____________________________________________________________________  External Attachment:    Type:   Image     Comment:   External Document

## 2010-08-14 NOTE — Medication Information (Signed)
Summary: Approval for Nexium/BCBS  Approval for Nexium/[employer]BS   Imported By: Lanelle Bal 01/16/2010 10:32:11  _____________________________________________________________________  External Attachment:    Type:   Image     Comment:   External Document

## 2010-08-14 NOTE — Progress Notes (Signed)
  Phone Note Outgoing Call   Summary of Call: call pt - no acute findings on abd u/s.  mild fatty liver Initial call taken by: D. Thomos Lemons DO,  September 01, 2009 1:22 PM  Follow-up for Phone Call        informed pt. of no acute findings on u/s and mild fatty liver Follow-up by: Michaelle Copas,  September 01, 2009 4:48 PM

## 2010-08-16 NOTE — Progress Notes (Signed)
Summary: Citaloptram Refill  Phone Note Call from Patient Call back at Home Phone [phone]   Caller: Patient Call For: D. Thomos Lemons DO Reason for Call: Refill Medication Summary of Call: patient called requesting a refill on Citalopram. Is it okay to refill or is a return office visit needed. Patient was last seen in July for knee pain Initial call taken by: Glendell Docker CMA,  July 27, 2010 1:19 PM  Follow-up for Phone Call        ok to refill x 5 Follow-up by: D. Thomos Lemons DO,  July 27, 2010 5:17 PM  Additional Follow-up for Phone Call Additional follow up Details #1::        call returned to patient at [phone], no answer. A detailed voice message was left informing patient rx sent to pharmacy. Message was left for patient to call back if any questions Additional Follow-up by: Glendell Docker CMA,  July 30, 2010 9:53 AM    Prescriptions: CELEXA 10 MG TABS (CITALOPRAM HYDROBROMIDE) Take 1 tablet by mouth once a day  #30 Tablet x 5   Entered by:   Glendell Docker CMA   Authorized by:   D. Thomos Lemons DO   Signed by:   Glendell Docker CMA on 07/30/2010   Method used:   [facility]        [facility] [facility] [address] [id number]       [address]       [place], [place]  [address]       Ph: [phone]       Fax: [phone]   RxID:   [id number]

## 2010-08-31 ENCOUNTER — Ambulatory Visit (INDEPENDENT_AMBULATORY_CARE_PROVIDER_SITE_OTHER): Payer: BC Managed Care – PPO | Admitting: Family

## 2010-08-31 ENCOUNTER — Encounter: Payer: Self-pay | Admitting: Family

## 2010-08-31 DIAGNOSIS — J329 Chronic sinusitis, unspecified: Secondary | ICD-10-CM

## 2010-09-05 NOTE — Assessment & Plan Note (Signed)
Summary: SINUS INFECTION/HEA   Vital Signs:  Patient profile:   55 year old female Menstrual status:  postmenopausal Height:      65 inches Weight:      206 pounds BMI:     34.40 O2 Sat:      96 % on Room air Temp:     98.4 degrees F oral Pulse rate:   83 / minute Resp:     20 per minute BP sitting:   118 / 80  (right arm) Cuff size:   large  Vitals Entered By: [name] [name] CMA (August 31, 2010 2:00 PM)  O2 Flow:  Room air as and and inCC: Head Congestion Is Patient Diabetic? No Pain Assessment Patient in pain? no      Comments states that her head has been in a  fog for the past 2 days, nasal congestion, sweling, right ear discomfort, taken sudafed, and netti pot with no relief   Primary Care Provider:  Dondra Spry DO  CC:  Head Congestion.  History of Present Illness: [name] [name] is a 55 year old female who presents today with chief complaint of sinus congestion. Symptoms started 2-3 days ago. Symptoms worsenend by nothing. Symptoms associated with pain overlying the bridge of her nose and in the bilateral maxillary sinus. Symptoms improved some with use of the neti pot and tylenol and sudafed. Denies associated fever.   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 Lina Sar Colonic polyps, hx of  Past Surgical History: Last updated: 02/07/2010 Breast biopsy 2000  Appendectomy 2008         Review of Systems       patient denies visual disturbances or difficulties  Physical Exam  General:  Well-developed,well-nourished,in no acute distress; alert,appropriate and cooperative throughout examination Head:  Normocephalic and atraumatic  without obvious abnormalities. No apparent alopecia or balding. bilateral mild maxillary tenderness to palpation. Mild swelling overlying the nasal bridge without erythema. Eyes:  pupils equal round reactive to light and accommodation sclera is clear Ears:  External ear exam shows no significant lesions or deformities.  Otoscopic examination reveals clear canals, tympanic membranes are intact bilaterally without bulging, retraction, inflammation or discharge. Hearing is grossly normal bilaterally. Mouth:  Oral mucosa and oropharynx without lesions or exudates.   Neck:  No deformities, masses, or tenderness noted. Lungs:  Normal respiratory effort, chest expands symmetrically. Lungs are clear to auscultation, no crackles or wheezes. Heart:  Normal rate and regular rhythm. S1 and S2 normal without gallop, murmur, click, rub or other extra sounds.   Impression & Recommendations:  Problem # 1:  SINUSITIS (ICD-473.9) Assessment New patient notes three-day history of worsening sinus pressure, however she notes that she stays chronically congested. Suspect that her symptoms have been going on longer than 3 days with her chronic history. As such we'll plan treatment with Levaquin for 5 days plan given her history of penicillin allergy. Her updated medication list for this problem includes:    Levaquin 750 Mg Tabs (Levofloxacin) ..... One tablet by mouth daily for 5 days  Complete Medication List: 1)  Carafate 1 Gm/62ml Susp (Sucralfate) .... Take 2 teaspoons by mouth 1 hour before  meal 2)  Pravastatin Sodium 40 Mg Tabs (Pravastatin sodium) .... One by mouth once daily 3)  Nexium 40 Mg Cpdr (Esomeprazole magnesium) .... Take 1 tablet by mouth two times a day 4)  Celexa 10 Mg Tabs (Citalopram hydrobromide) .... Take 1 tablet by mouth once a day 5)  Calcium Carbonate 600 Mg Tabs (Calcium carbonate) .... Take 1 tablet by mouth two times a day 6)  Proventil Hfa 108 (90 Base) Mcg/act Aers (Albuterol  sulfate) .... 2 puffs qid as needed 7)  Levaquin 750 Mg Tabs (Levofloxacin) .... One tablet by mouth daily for 5 days  Patient Instructions: 1)  Call if you develop fever over 101, increasing sinus pressure, pain with eye movement, increased facial tenderness of swelling, or if you develop visual changes. Prescriptions: LEVAQUIN 750 MG TABS (LEVOFLOXACIN) one tablet by mouth daily for 5 days  #5 x 0   Entered and Authorized by:   Lemont Fillers FNP   Signed by:   Lemont Fillers FNP on 08/31/2010   Method used:   [facility]        [facility]  [address] [phone][facility]ail[facility]       [address]       [place], [place]  [address]       Ph: [phone]       Fax: [phone]   RxID:   [id number]    Orders Added: 1)  Est. Patient Level III [24401]    Current Allergies (reviewed today): PENICILLIN

## 2010-09-18 ENCOUNTER — Other Ambulatory Visit: Payer: BC Managed Care – PPO

## 2010-09-21 ENCOUNTER — Other Ambulatory Visit: Payer: Self-pay | Admitting: Internal Medicine

## 2010-09-21 ENCOUNTER — Encounter: Payer: Self-pay | Admitting: Internal Medicine

## 2010-09-21 LAB — TSH: TSH: 1.626 u[IU]/mL (ref 0.350–4.500)

## 2010-09-21 LAB — LIPID PANEL
HDL: 40 mg/dL (ref >39)
LDL Cholesterol: 123 mg/dL — ABNORMAL HIGH (ref 0–99)

## 2010-09-21 LAB — CONVERTED CEMR LAB
Albumin: 4.5 g/dL (ref 3.5–5.2)
BUN: 12 mg/dL (ref 6–23)
Chloride: 104 meq/L (ref 96–112)
HDL: 40 mg/dL (ref >39)
LDL Cholesterol: 123 mg/dL — ABNORMAL HIGH (ref 0–99)
Potassium: 4.6 meq/L (ref 3.5–5.3)
TSH: 1.626 microintl units/mL (ref 0.350–4.500)
Total CHOL/HDL Ratio: 5.2
Total Protein: 7.1 g/dL (ref 6.0–8.3)
Triglycerides: 216 mg/dL — ABNORMAL HIGH (ref <150)
VLDL: 43 mg/dL — ABNORMAL HIGH (ref 0–40)

## 2010-09-21 LAB — BASIC METABOLIC PANEL
BUN: 12 mg/dL (ref 6–23)
CO2: 26 mEq/L (ref 19–32)
Chloride: 104 mEq/L (ref 96–112)
Creat: 0.65 mg/dL (ref 0.40–1.20)

## 2010-09-21 LAB — HEPATIC FUNCTION PANEL
Albumin: 4.5 g/dL (ref 3.5–5.2)
Alkaline Phosphatase: 118 U/L — ABNORMAL HIGH (ref 39–117)
Total Bilirubin: 0.4 mg/dL (ref 0.3–1.2)

## 2010-09-25 ENCOUNTER — Encounter (INDEPENDENT_AMBULATORY_CARE_PROVIDER_SITE_OTHER): Payer: BC Managed Care – PPO | Admitting: Internal Medicine

## 2010-09-25 ENCOUNTER — Other Ambulatory Visit: Payer: Self-pay | Admitting: Internal Medicine

## 2010-09-25 ENCOUNTER — Encounter: Payer: Self-pay | Admitting: Internal Medicine

## 2010-09-25 ENCOUNTER — Encounter: Payer: BC Managed Care – PPO | Admitting: Internal Medicine

## 2010-09-25 ENCOUNTER — Other Ambulatory Visit (HOSPITAL_COMMUNITY)
Admission: RE | Admit: 2010-09-25 | Discharge: 2010-09-25 | Disposition: A | Discharge status: Home / Self Care | Payer: BC Managed Care – PPO | Source: Ambulatory Visit | Attending: Internal Medicine | Admitting: Internal Medicine

## 2010-09-25 DIAGNOSIS — Z0000 Encounter for general adult medical examination without abnormal findings: Secondary | ICD-10-CM

## 2010-09-25 DIAGNOSIS — Z124 Encounter for screening for malignant neoplasm of cervix: Secondary | ICD-10-CM | POA: Insufficient documentation

## 2010-09-25 NOTE — Miscellaneous (Signed)
Summary: Orders Update  Clinical Lists Changes  Orders: Added new Test order of T-Basic Metabolic Panel ([id number]) - Signed Added new Test order of T-Lipid Profile ([id number]) - Signed Added new Test order of T-Hepatic Function ([id number]) - Signed Added new Test order of T-TSH ([id number]) - Signed 

## 2010-09-27 ENCOUNTER — Encounter: Payer: Self-pay | Admitting: Internal Medicine

## 2010-09-28 ENCOUNTER — Encounter: Payer: BC Managed Care – PPO | Admitting: Internal Medicine

## 2010-10-02 NOTE — Letter (Signed)
   [place] at [facility] [address] [place], [place]  [address]  [place] Phone: [phone]      September 27, 2010   PRICILA BRIDGE [address] [facility] [place], [place] [address]  RE:  LAB RESULTS  Dear  Ms. Beem,  The following is an interpretation of your most recent lab tests.  Please take note of any instructions provided or changes to medications that have resulted from your lab work.  Pap Smear: normal - follow up in 2 years         Sincerely Yours,    Dr. Thomos Lemons  Appended Document:  mailed

## 2010-10-16 NOTE — Assessment & Plan Note (Signed)
Summary: CPE/SS   Vital Signs:  Patient profile:   55 year old female Menstrual status:  postmenopausal Height:      65 inches Weight:      206 pounds BMI:     34.40 O2 Sat:      97 % on Room air Temp:     98.0 degrees F oral Pulse rate:   72 / minute Resp:     18 per minute BP sitting:   124 / 68  (right arm) Cuff size:   large  Vitals Entered By: Glendell Docker CMA (September 25, 2010 2:48 PM)  O2 Flow:  Room air CC: CPX Is Patient Diabetic? No Pain Assessment Patient in pain? no        Primary Care Provider:  Dondra Spry DO  CC:  CPX.  History of Present Illness: 55 y/o female for routine cpx  FLU VAX          Every 12 months                                Due Now  TD BOOSTER       Every 10 years          02/03/2007  Tdap       Due On: 02/02/2017  ZOSTAVAX         At Age 31 years                                Due On: 04/22/2017  COLONOSCOPY      Every 10 years          08/19/2008  Location.[facility] Kitchen[facility] KitchenDue On: 08/19/2018  COLONNXTDUE      At Age 59 years         08/19/2008  08/2015    Done  FLEX SIGMOID     Every 5 years                                  Due Now  HEMOCCULT        Every 12 months                                Due Now  MAMMOGRAM        Every 12 months         09/25/2010  Due        Due On: 09/25/2011  MAMMO DUE        At Age 71 years                                Due Now  PAP SMEAR        Every 12 months         08/28/2009  NEGATIVE.Marland KitchenMarland KitchenDue Now  PAP DUE          At Age 33 years                                Due Now  CHOLESTEROL      Every 5 years           09/21/2010  206  Due On: 09/21/2015  still smoking  Preventive Screening-Counseling & Management  Alcohol-Tobacco     Alcohol drinks/day: 0     Smoking Status: current  Caffeine-Diet-Exercise     Caffeine use/day: None     Does Patient Exercise: no  Allergies: 1)  Penicillin  Past History:  Past Medical History: GERD Hx of mitral valve prolapse Hyperlipidemia   Abnormal Glucose      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    Physical Exam  General:  alert, well-developed, and well-nourished.   Head:  normocephalic and atraumatic.   Eyes:  pupils equal, pupils round, and pupils reactive to light.   Ears:  R ear normal and L ear normal.   Mouth:  pharynx pink and moist.   Chest Wall:  no deformities, no tenderness, and no mass.   Breasts:  No mass, nodules, thickening, tenderness, bulging, retraction, inflamation, nipple discharge or skin changes noted.   Lungs:  normal respiratory effort and normal breath sounds.   Heart:  normal rate, regular rhythm, no murmur, and no gallop.   Abdomen:  soft, non-tender, and normal bowel sounds.   Genitalia:  normal introitus, no external lesions, no vaginal discharge, no vaginal or cervical lesions, and no adnexal masses or tenderness.   Extremities:  No lower extremity edema  Neurologic:  cranial nerves II-XII intact and DTRs symmetrical and normal.   Psych:  normally interactive, good eye contact, not anxious appearing, and not depressed appearing.     Impression & Recommendations:  Problem # 1:  HEALTH MAINTENANCE EXAM (ICD-V70.0) Reviewed adult  health maintenance protocols. Pt counseled on diet and exercise. Encouraged smoking cessation  Mammogram: Due (09/25/2010) Pap smear: NEGATIVE FOR INTRAEPITHELIAL LESIONS OR MALIGNANCY. (08/28/2009) Colonoscopy: Location:  [facility].   (08/19/2008) Bone Density: abnormal-osteoporosis (02/29/2008) Td Booster: Tdap (02/03/2007)   Chol: 206 (09/21/2010)   HDL: 40 (09/21/2010)   LDL: 123 (09/21/2010)   TG: 216 (09/21/2010) TSH: 1.626 (09/21/2010)   Next Colonoscopy due:: 08/2015 (08/19/2008)   Orders: Mammogram (Screening) (Mammo)  Complete Medication List: 1)  Carafate 1 Gm/24ml Susp (Sucralfate) .... Take 2 teaspoons by mouth 1 hour before  meal 2)  Pravastatin Sodium 40 Mg Tabs (Pravastatin sodium) .... One by mouth once daily 3)  Nexium 40 Mg Cpdr (Esomeprazole magnesium) .... Take 1 tablet by mouth two times a day 4)  Celexa 10 Mg Tabs (Citalopram hydrobromide) .... Take 1 tablet by mouth once a day 5)  Calcium Carbonate 600 Mg Tabs (Calcium carbonate) .... Take 1 tablet by mouth two times a day 6)  Proventil Hfa 108 (90 Base) Mcg/act Aers (Albuterol sulfate) .... 2 puffs qid as needed   Orders Added: 1)  Mammogram (Screening) [Mammo] 2)  Est. Patient 40-64 years [phone]    Current Allergies (reviewed today): PENICILLIN   Preventive Care Screening  Mammogram:    Date:  09/25/2010    Results:  Due

## 2010-10-24 LAB — CBC
HCT: 43.4 % (ref 36.0–46.0)
Hemoglobin: 15.1 g/dL — ABNORMAL HIGH (ref 12.0–15.0)
MCHC: 34.9 g/dL (ref 30.0–36.0)
MCV: 88.6 fL (ref 78.0–100.0)
Platelets: 232 10*3/uL (ref 150–400)
RBC: 4.9 MIL/uL (ref 3.87–5.11)
RDW: 14.3 % (ref 11.5–15.5)
WBC: 9.2 10*3/uL (ref 4.0–10.5)

## 2010-10-24 LAB — POCT I-STAT, CHEM 8
BUN: 6 mg/dL (ref 6–23)
Calcium, Ion: 1.14 mmol/L (ref 1.12–1.32)
Chloride: 103 mEq/L (ref 96–112)
Creatinine, Ser: 0.7 mg/dL (ref 0.4–1.2)
Glucose, Bld: 95 mg/dL (ref 70–99)
HCT: 46 % (ref 36.0–46.0)
Hemoglobin: 15.6 g/dL — ABNORMAL HIGH (ref 12.0–15.0)
Potassium: 3.9 mEq/L (ref 3.5–5.1)
Sodium: 139 mEq/L (ref 135–145)
TCO2: 25 mmol/L (ref 0–100)

## 2010-10-24 LAB — DIFFERENTIAL
Basophils Absolute: 0 10*3/uL (ref 0.0–0.1)
Basophils Relative: 0 % (ref 0–1)
Eosinophils Absolute: 0.1 10*3/uL (ref 0.0–0.7)
Eosinophils Relative: 2 % (ref 0–5)
Lymphocytes Relative: 19 % (ref 12–46)
Monocytes Absolute: 0.5 10*3/uL (ref 0.1–1.0)
Monocytes Relative: 6 % (ref 3–12)
Neutro Abs: 6.7 10*3/uL (ref 1.7–7.7)

## 2010-11-27 NOTE — Assessment & Plan Note (Signed)
[facility]                         GASTROENTEROLOGY OFFICE NOTE   NAME:[name], [name] DUCH                         MRN:          [id number]  DATE:11/26/2006                            DOB:          [birth year]    [name] [name] is a very nice 55 year old patient of Dr. Duaine Dredge who is here  for evaluation of episodes of what sounds like laryngeal spasm or a  stridor which will occur at night.  Dr. Duaine Dredge has written a letter of  referral which describes the patient's symptoms of unable to breathe and  sensation of choking which wakes patient up at night.  The last episode  actually occurred last night.  The episode prior to that was two weeks  before that.  The episodes are getting closer together and are quite  frightening for the patient.  They never occur during the day.  She has  sometimes difficulty in swallowing certain liquids especially hot  liquids, but there has been no solid food dysphagia.  Several members of  her family have history of esophageal strictures requiring dilatations,  two brothers, mother, and one sister.  She has had indigestion for many  years.  She remembers at age of 22 had a similar episode at night of  choking followed by coughing.  Unfortunately the patient has continued  to smoke through the years for past at least 30 years and has not really  been able to stop.  She was unable to tolerate Chantix given to her by  Dr. Duaine Dredge.  She also has gained about 50 pounds from her usual weight  of 130 pounds to currently 187 pounds.  She blames it on stress.  Her  son was stationed in [place] for 18 months, and she was quite worried.  She  denies any hematemesis, nausea, chest pain.   MEDICATIONS:  1. Lipitor 40 mg p.o. daily.  2. Citalopram 10 mg p.o. daily.  3. Prilosec 20 mg p.o. b.i.d. starting last week.  Prior to that she      was on 20 mg a day.  She has also tried Nexium, Zantac, possibly      Protonix.   PAST MEDICAL HISTORY:   History of high cholesterol, anxiety, breast  surgery.  She had fibroadenoma of the breast.   FAMILY HISTORY:  Positive for gastroesophageal reflux, alcoholism in  father.  No history of colon cancer.   SOCIAL HISTORY:  Married with two children.  High school education.  Works as an Environmental health practitioner.  She smokes.  She does not drink  alcohol.   REVIEW OF SYSTEMS:  Positive for weight gain of about 50 pounds.  Allergies, night sweats, shortness of breath.  She had arthroscopic  appendectomy by Dr. Abbey Chatters in 2006.   PHYSICAL EXAMINATION:  VITAL SIGNS:  Blood pressure 122/70, pulse 64,  weight 187.8 pounds.  GENERAL:  Her voice was somewhat raspy.  She cleared her throat  frequently.  HEENT:  Sclerae anicteric, oral cavity was normal, no thrush.  NECK:  Supple with no adenopathy.  LUNGS:  Clear to auscultation, no wheezes or  rales.  COR:  Quiet precordium, normal S1, normal S2.  ABDOMEN:  Soft, mildly protuberant, nontender.  Epigastric and  subxiphoid area unremarkable.  Liver edge at costal margin.  Had good  percussion.  Abdomen was normal.  No scars except post appendectomy.  RECTAL:  Not done.  EXTREMITIES:  No edema.   IMPRESSION:  A 55 year old white female with episodic stridor and  nocturnal laryngeal spasms almost certainly caused by gastroesophageal  reflux.  She has a history of reflux symptoms post prandial as well as  at night which have been partially controlled on PPIs, but because of  her weight gain and concomitant smoking as well as late night eating,  her symptoms have actually become more frequent and more dramatic.  This  certainly will have to be evaluated and controlled because she may  develop nocturnal aspiration and even pneumonia.   PLAN:  I have discussed with the patient her absolute necessity to make  a serious attempt to stop smoking, and at the same time she should start  to lose weight to reduce the gastroesophageal reflux.  We will  have to  rule out Barrett's esophagus which at this point is a possibility.  She  does not have any symptoms such as typical esophageal stricture like her  brothers and sisters, but she may have some esophageal dysmotility as a  result of the chronic reflux.  The Prilosec 20 mg twice a day did not  seem to prevent last night's attack so we will have to step up her  regimen for right now.   1. Reglan 10 mg q.h.s. electronically prescribed.  2. Take Prilosec 40 mg at night instead of 20 b.i.d. to get a better      nighttime coverage.  3. Antireflux measures.  Avoid late night eating.  4. Serious attempt to stop smoking.  Maybe she needs hypnosis or some      other intervention.  5. Upper endoscopy scheduled.  We will plan to do biopsies.  Rule out      hiatal hernia.  Rule out esophageal stricture.  She may even be in      the future a candidate for Nissen fundoplication, but she would      have to probably lose weight and stop smoking to reduce her risks      for recurrent reflux.     Hedwig Morton. Juanda Chance, MD     DMB/MedQ  DD: 11/26/2006  DT: 11/26/2006  Job #: [id number]   cc:   Mosetta Putt, M.D.

## 2010-11-30 NOTE — H&P (Signed)
NAMEITSEL, OPFER                  ACCOUNT NO.:  [id number]   MEDICAL RECORD NO.:  [id number]          PATIENT TYPE:  INP   LOCATION:  5708                         FACILITY:  [facility]   PHYSICIAN:  Adolph Pollack, M.D.DATE OF BIRTH:  03-15-1956   DATE OF ADMISSION:  06/14/2005  DATE OF DISCHARGE:                                HISTORY & PHYSICAL   CHIEF COMPLAINT:  Abdominal pain.   HISTORY OF PRESENT ILLNESS:  This is a 55 year old female who about mid noon  today developed some diffuse pressure-type and achy abdominal pain that  radiated down the right lower quadrant.  It was associated with nausea and  vomiting.  She had some chills.  Lost her appetite.  She subsequently  presented to the emergency department where she was seen and underwent a CT  scan which demonstrated abnormal appendix with it being dilated.  She had a  white blood cell count of 19,900, right lower quadrant tenderness, and I was  asked to see her.   PAST MEDICAL HISTORY:  1.  Mitral valve prolapse.  2.  Gastroesophageal reflux disease.  3.  Anxiety disorder.   PAST SURGICAL HISTORY:  None.   ALLERGIES:  PENICILLIN leads to hives and rash.   MEDICATIONS:  1.  Celexa.  2.  Lipitor.  3.  Prilosec.   SOCIAL HISTORY:  She is married, here with her husband.  Works as an  Environmental health practitioner.  Smokes two packs of cigarettes a day, has been  doing so for about 30 years.  Occasional alcohol use.   REVIEW OF SYSTEMS:  CARDIOVASCULAR:  She denies any coronary artery disease,  hypertension.  PULMONARY:  Denies asthma, COPD, pneumonia, tuberculosis  exposure.  GASTROINTESTINAL:  Denies peptic ulcer disease, hepatitis.  GENITOURINARY:  Denies any kidney stones, dysuria, hematuria.  ENDOCRINE:  Denies diabetes.  Denies thyroid disease.  NEUROLOGIC:  Denies strokes or  seizures.  HEMATOLOGIC:  Denies any bleeding disorders or blood clots.   PHYSICAL EXAMINATION:  GENERAL:  A slightly ill-appearing female,  very  pleasant and cooperative.  VITAL SIGNS:  Temperature is 100.5, blood pressure is 138/68, pulse of 92,  respiratory rate is 20.  HEENT:  Eyes:  Extraocular movements were intact.  No icterus.  Wears  glasses.  NECK:  Supple without obvious masses or thyroid enlargement.  RESPIRATORY:  Breath sounds equal and clear.  Respirations unlabored.  CARDIOVASCULAR:  Regular rate and rhythm.  I cannot appreciate a murmur.  No  lower extremity edema.  ABDOMEN:  Soft.  No palpable masses.  There is mild to moderate right lower  quadrant tenderness present.  No referred signs.  EXTREMITIES:  Good muscle tone, good range of motion.   LABORATORY DATA:  Remarkable for a white blood cell count of 19,900.  Her  hemoglobin is 16.  Glucose slightly elevated at 110.  UA negative.  Urine  pregnancy is negative.   CT scan is reviewed with Dr. Signa Kell.  This demonstrates an abnormally  dilated appendix with some points being up to 10 mm.   IMPRESSION:  Right lower  quadrant pain with laboratory findings, CT scan  findings, and examination suggestive of acute appendicitis.   PLAN:  To the operating room for a laparoscopic, possible open appendectomy.  I have discussed the procedure and the risks.  The risks include, but are  not limited to bleeding, infection, wound healing problems, anesthesia,  accidental injury to intra-abdominal organs.  She seems to understand this  and agrees to proceed.      Adolph Pollack, M.D.  Electronically Signed     TJR/MEDQ  D:  06/14/2005  T:  06/15/2005  Job:  [id number]

## 2010-11-30 NOTE — Discharge Summary (Signed)
NAMEEVALIN, SHAWHAN                  ACCOUNT NO.:  [id number]   MEDICAL RECORD NO.:  [id number]          PATIENT TYPE:  INP   LOCATION:  5708                         FACILITY:  [facility]   PHYSICIAN:  Adolph Pollack, M.D.DATE OF BIRTH:  [birth year]   DATE OF ADMISSION:  06/14/2005  DATE OF DISCHARGE:  06/16/2005                                 DISCHARGE SUMMARY   PRINCIPAL DISCHARGE DIAGNOSIS:  Acute appendicitis.   SECONDARY DIAGNOSES:  1.  Gastroesophageal reflux disease.  2.  Hypercholesterolemia.  3.  Anxiety disorder.   PROCEDURE:  Laparoscopic appendectomy.   REASON FOR ADMISSION:  This 55 year old female had diffuse abdominal pain  the day of admission that migrated to the right lower quadrant.  She was  seen in the emergency department and was noted to have an elevation of her  white blood cell count and a CT scan was performed which was consistent with  appendicitis.  She subsequently was admitted.   HOSPITAL COURSE:  She was placed on IV antibiotics and taken to the  operating room where she underwent the laparoscopic appendectomy.  She had  uncomplicated postoperative course and by June 16, 2005 she was ready to  be discharged.   DISPOSITION:  Discharged home in satisfactory condition June 16, 2005.  She was given discharge instructions which included activity restrictions  and dietary recommendations.  She was also given Vicodin for pain and told  to follow up in the office in two to three weeks.      Adolph Pollack, M.D.  Electronically Signed     TJR/MEDQ  D:  08/13/2005  T:  08/13/2005  Job:  [id number]

## 2010-11-30 NOTE — Letter (Signed)
June 02, 2007    Mosetta Putt, M.D.  [address] [place], [place] [address]   RE:  [name] [name], [name] [name]  MRN:  [id number]  /  DOB:  January 23, 1956   Dear Theron Arista:   I would like to give a followup on [name], whom you have sent for  evaluation of nocturnal episodes of choking consistent with  gastroesophageal reflux.  As you know, she underwent upper endoscopy in  May 2008 with findings of a mild esophageal stricture.  There was no  evidence for Barrett's esophagus.  The esophageal mucosa was consistent  with mild reflux esophagitis.  She did extremely well through the summer  on Prilosec 40 mg in the morning and two 20-mg tablets at night.  She  unfortunately has not lost any weight and I think she still smokes, but  we heard from her 1 week ago when she had another episode of the choking  and regurgitation which woke her up at night and it was quite scary for  her; in fact, she had 2 of these episodes in 1 night.  We put her on  Carafate slurry and switched her from Prilosec to Nexium 40 b.i.d., but  she feels that the Prilosec worked better.  I talked to her today after  we did an upper GI series with cine esophagram to rule out esophageal  dysmotility or esophageal spasm.  The x-ray was done at [facility]  and it is entirely normal.  She had no evidence of swallowing  dysfunction, stricture, mucosal irregularity or dysmotility.  The 13-mm  barium tablet passed through the GE junction easily.   [name] is doing just fine now, but I am sure that her nocturnal  episodes represent a severe gastroesophageal reflux.  Unfortunately,  these episodes do not occur very often, so I am not sure she is really a  candidate for Nissen fundoplication.  In addition to that, she has not  lost any weight and she may still be smoking, so I think medical  treatment would be the only option here at this point.  We talked today  about possibly doing a Nissen fundoplication and the fact that she  may  one day end up with it, but I assured her that her symptoms would  probably improve  significantly if she could follow strict antireflux measures.  I will be  happy to see her in the future if she has continued problems.   Thank you for letting me share in her care.  Best regards.    Sincerely,      Hedwig Morton. Juanda Chance, MD  Electronically Signed    DMB/MedQ  DD: 06/02/2007  DT: 06/03/2007  Job #: [id number]

## 2010-11-30 NOTE — Op Note (Signed)
NAMEJACQUEL, REDDITT                  ACCOUNT NO.:  [id number]   MEDICAL RECORD NO.:  [id number]          PATIENT TYPE:  INP   LOCATION:  2550                         FACILITY:  [facility]   PHYSICIAN:  Adolph Pollack, M.D.DATE OF BIRTH:  1955-12-28   DATE OF PROCEDURE:  06/15/2005  DATE OF DISCHARGE:                                 OPERATIVE REPORT   PREOPERATIVE DIAGNOSIS:  Acute appendicitis.   POSTOPERATIVE DIAGNOSIS:  Acute appendicitis.   PROCEDURES:  Laparoscopic appendectomy.   SURGEON:  Adolph Pollack, M.D.   ANESTHESIA:  General.   INDICATIONS:  This is a 55 year old female, who began developing some  diffuse abdominal pain of 12:30 on 06/14/05. The pain then migrated to the  right lower quadrant and she presented in the evening to the emergency  department, where she was evaluated by the emergency department physician.  Right lower quadrant tenderness and leukocytosis were noted. A CT scan  demonstrated an abnormal appendix with dilation of the appendix. This was  felt to be potentially consistent with acute appendicitis and I was asked to  see her. She now is brought here for a laparoscopic appendectomy.   TECHNIQUE:  She was seen in the holding area and brought to the operating  room, placed supine on the operating table, and a general anesthetic was  administered.  A Foley catheter was placed in the bladder. The abdominal  wall was sterilely prepped and draped.   Dilute Marcaine was infiltrated in the subumbilical region and a small  subumbilical incision was made through skin, subcutaneous tissue, fascia,  and peritoneum, entering the peritoneal cavity under direct vision. A  pursestring suture of 0 Vicryl was placed around the fascial edges. A Hasson  trocar was introduced into the peritoneal cavity and pneumoperitoneum was  created by insufflation of CO2 gas.   Next, the laparoscope was introduced and she was placed in Trendelenburg  position with the right  side rotated slightly upward.  A 5 mm trocar was  placed in the left lower quadrant and one also the right upper quadrant. The  cecum was grasped; the cecum was well-into the pelvis.  It is brought-up  into the wound and an inflamed appendix was noted, without evidence of  perforation.  There were mild inflammatory changes noted.  The mesoappendix  was grasped and retracted anteriorly. The mesoappendix was then divided  toward the base, with the harmonic scalpel.  The appendix was amputated off  the cecum with the Endo-GIA stapler and placed in the Endopouch bag. The  staple line was solid, without bleeding or leakage. The appendix and the  Endopouch bag were then removed through the subumbilical port.   I then examined the terminal ileum, which showed no inflammatory change. The  right tube and ovary did not show any inflammatory change. There were some  small cysts in the fallopian tube. No uterine inflammation was noted. The  area was copiously irrigated with saline solution and solution evacuated. I  then removed the trocars and released the pneumoperitoneum. The subumbilical  fascial defect was closed by tying-up and  tying-down the pursestring  suture. The skin incisions were closed with 4-0 Monocryl subcuticular  stitches, followed by Steri-Strips, and sterile dressings.   She tolerated procedure without any apparent complications and was taken to  recovery in satisfactory condition.      Adolph Pollack, M.D.  Electronically Signed     TJR/MEDQ  D:  06/15/2005  T:  06/17/2005  Job:  [id number]

## 2011-01-01 ENCOUNTER — Other Ambulatory Visit: Payer: Self-pay | Admitting: Internal Medicine

## 2011-01-02 ENCOUNTER — Encounter: Payer: Self-pay | Admitting: Internal Medicine

## 2011-01-02 NOTE — Telephone Encounter (Signed)
Rx refill sent to pharmacy. 

## 2011-02-04 ENCOUNTER — Telehealth: Payer: Self-pay | Admitting: Internal Medicine

## 2011-02-04 MED ORDER — ESOMEPRAZOLE MAGNESIUM 40 MG PO CPDR: 40.0000 mg | DELAYED_RELEASE_CAPSULE | Freq: Two times a day (BID) | ORAL | Status: DC

## 2011-02-04 NOTE — Telephone Encounter (Signed)
Refill-nexium dr 40mg  capsule. Take one tablet by mouth two times a day. Qty 180. Last fill 6.18.12

## 2011-02-04 NOTE — Telephone Encounter (Signed)
Rx refill see to pharmacy

## 2011-02-06 ENCOUNTER — Telehealth: Payer: Self-pay | Admitting: Internal Medicine

## 2011-02-06 MED ORDER — CITALOPRAM HYDROBROMIDE 10 MG PO TABS: 10.0000 mg | ORAL_TABLET | Freq: Every day | ORAL | Status: DC

## 2011-02-06 NOTE — Telephone Encounter (Signed)
Opened in error

## 2011-02-06 NOTE — Telephone Encounter (Signed)
Rx refill sent to pharmacy. 

## 2011-02-06 NOTE — Telephone Encounter (Signed)
Refill-citalopram hbr 10mg  tablet. Take one tablet by mouth once a day. Qty 30. Last fill 12.15.11

## 2011-02-07 ENCOUNTER — Telehealth: Payer: Self-pay | Admitting: *Deleted

## 2011-02-07 NOTE — Telephone Encounter (Signed)
Received call from pt inquiring about status of prior auth for Nexium. Called pharmacy and she states info was faxed to the [place] office. She will refax info to [phone]. Provided me with [phone] to call for PA request. Printed prior auth form from [email or web address] and forwarded to Provider for signature. Notified pt and left 3 boxes of Nexium Lot [id number] Exp 03/2013 at front desk for pick up.

## 2011-02-08 NOTE — Telephone Encounter (Signed)
Received notification from [employer] that Nexium has been approved from 02/08/11 through 02/12/11. Spoke with Inetta Fermo @ [employer] to verify coverage dates and was told that pt's pharmacy benefits will expire on 02/12/11 and they will only give approval through their coverage date. She stated that the pt needs to call customer service and update her coverage. Once coverage has been updated, we will have to begin prior authorization process again. Notified [facility] of approval and rx cleared for a 30 day supply. Notified pt. She will call insurance company and give me an update next week.

## 2011-02-08 NOTE — Telephone Encounter (Signed)
PA form signed and faxed to [place] [employer] [place] [phone]. Awaiting approval / denial status.

## 2011-02-13 NOTE — Telephone Encounter (Signed)
Pt left voice message that she has updated her insurance coverage and was told the Nexium authorization was good through 01/2012. Called and spoke to [facility] she states that pt's medical coverage has been updated but her pharmacy benefits have not changed. She advises to have pt call customer service and speak to someone about her pharmacy benefits. Left message for pt to return my call.

## 2011-02-14 NOTE — Telephone Encounter (Signed)
Pt returned my call and verified that her ins. Is through [place] [employer] [place]. She is being told that everything is current. Gave pt the number listed below and referenced CSR, Tina. Pt will update me of status.

## 2011-02-14 NOTE — Telephone Encounter (Signed)
Left detailed message for pt to contact insurance and update pharmacy benefits as previously requested and to call me once that has been taken care of so we can try to get the Nexium PA extended.

## 2011-02-19 MED ORDER — ESOMEPRAZOLE MAGNESIUM 40 MG PO CPDR: 40.0000 mg | DELAYED_RELEASE_CAPSULE | Freq: Two times a day (BID) | ORAL | Status: DC

## 2011-02-19 MED ORDER — CITALOPRAM HYDROBROMIDE 10 MG PO TABS: 10.0000 mg | ORAL_TABLET | Freq: Every day | ORAL | Status: DC

## 2011-02-19 NOTE — Telephone Encounter (Signed)
Pt called stating her prescription benefits are now through [employer] @ [phone] Member ID [id number]. Group # [id number].  Pt also requests that we send 90 day supply of Celexa to [employer] and obtain PA for Nexium from them. Spoke to [place] at [place] and verified that Nexium and Celexa are covered and do not require prior authorization. Refill sent for Nexium 40mg  #180 x 1 refill and celexa 10mg  #90 x 1 refill. Spoke to [employer] and cancelled previous Nexium rx #90 as this was the incorrect quantity. Corrected quantity of 180 was resubmitted. Pt has been notified.

## 2011-04-05 ENCOUNTER — Encounter (HOSPITAL_BASED_OUTPATIENT_CLINIC_OR_DEPARTMENT_OTHER)
Admission: RE | Admit: 2011-04-05 | Discharge: 2011-04-05 | Disposition: A | Discharge status: Home / Self Care | Payer: BC Managed Care – PPO | Source: Ambulatory Visit | Attending: Otolaryngology | Admitting: Otolaryngology

## 2011-04-08 ENCOUNTER — Ambulatory Visit (HOSPITAL_BASED_OUTPATIENT_CLINIC_OR_DEPARTMENT_OTHER)
Admission: RE | Admit: 2011-04-08 | Discharge: 2011-04-08 | Disposition: A | Discharge status: Home / Self Care | Payer: BC Managed Care – PPO | Source: Ambulatory Visit | Attending: Otolaryngology | Admitting: Otolaryngology

## 2011-04-08 ENCOUNTER — Other Ambulatory Visit: Payer: Self-pay | Admitting: Otolaryngology

## 2011-04-08 DIAGNOSIS — Z01810 Encounter for preprocedural cardiovascular examination: Secondary | ICD-10-CM | POA: Insufficient documentation

## 2011-04-08 DIAGNOSIS — Z01812 Encounter for preprocedural laboratory examination: Secondary | ICD-10-CM | POA: Insufficient documentation

## 2011-04-08 DIAGNOSIS — J449 Chronic obstructive pulmonary disease, unspecified: Secondary | ICD-10-CM | POA: Insufficient documentation

## 2011-04-08 DIAGNOSIS — J4489 Other specified chronic obstructive pulmonary disease: Secondary | ICD-10-CM | POA: Insufficient documentation

## 2011-04-08 DIAGNOSIS — J381 Polyp of vocal cord and larynx: Secondary | ICD-10-CM | POA: Insufficient documentation

## 2011-04-08 LAB — POCT HEMOGLOBIN-HEMACUE: Hemoglobin: 13.3 g/dL (ref 12.0–15.0)

## 2011-04-15 NOTE — Op Note (Signed)
NAMEMARCHETTA, NAVRATIL                  ACCOUNT NO.:  [id number]  MEDICAL RECORD NO.:  [id number]  LOCATION:                                 FACILITY:  PHYSICIAN:  Zola Button T. Lazarus Salines, M.D.      DATE OF BIRTH:  DATE OF PROCEDURE:  04/08/2011 DATE OF DISCHARGE:                              OPERATIVE REPORT   PREOPERATIVE DIAGNOSIS:  Bilateral vocal cord polyps.  POSTOPERATIVE DIAGNOSIS:  Bilateral vocal cord polyps.  PROCEDURE PERFORMED:  Micro direct laryngoscopy with bilateral polyps stripping.  SURGEON:  Gloris Manchester. Nasire Reali, MD.  ANESTHESIA:  General orotracheal.  ESTIMATED BLOOD LOSS:  None.  COMPLICATIONS:  None.  FINDINGS:  Bulky bilateral vocal cord polyps with partial airway obstruction.  Hemorrhage on the left, more fibrotic on the right. Improved airway.  No distinct sign of cancer.  DESCRIPTION OF PROCEDURE:  With the patient in a comfortable supine position, general orotracheal anesthesia was induced without difficulty. At an appropriate level, the table was turned 90 degrees and the patient placed in a slight reverse Trendelenburg.  A moist 4x4 was used to protect the upper gums.  Taking care to protect the teeth and lips, as well as the upper gum, and endotracheal tube, the anterior commissure laryngoscope was introduced, passed into the glottis for good visualization and suspended in the standard fashion.  Photographs were taken.  Epinephrine solution 1:1000 was applied on cottonoids to the mucosal surfaces of the polyps for several minutes for intraoperative hemostasis.  The pledgets were removed.  The findings were as described above. Beginning on the right side, the polyp was grasped with a suction tip and retracted medially.  The mucosa overlying the superior aspect of the vocal cord back towards the ventricle was incised linearly from posterior to anterior.  The sickle knife was used to dissect the Reinke's layer from the vocal ligament.  Posteriorly, the  mucosa was separated with the scissors.  The dissection was carried onto the inferior surface of the vocal ligament and forward and was limited with the scissors and then stripped away.  This was sent for specimen.  Some anterior commissure pressure allowed better access to the anterior commissure area.  The mucosa directly in the anterior commissure was not disturbed.  After completing the right side, the left side was done in a similar fashion.  It was more liquid internally and was easier to strip off, once again taking care to avoid any direct trauma to the anterior commissure mucosa.  This also was sent for specimen.  Upon stripping both vocal cords, hemostasis was observed.  Once again 1:1000 epinephrine pledgets were applied against the raw surfaces for intraoperative hemostasis.  There were no additional mucosal tags which were felt to need the dissection or clearing.  Photographs were taken prior to beginning the procedure and at this point at the completion of the procedure.  At this point, the procedure was completed and the patient was returned to Anesthesia, awakened, extubated, and transferred to recovery in stable condition.  Comment:  A 55 year old white female with a long history of smoking, reflux, and known vocal polyps here, recently had sudden deterioration of the quality  of her voice probably related to hemorrhage into the polyps, hence the indication for today's procedure.  Anticipate routine postoperative recovery with attention to absence of smoking, rigorous reflux control, and speech pathology when appropriate probably 1-2 weeks.  Given low anticipated risk of postanesthetic or postsurgical complications, I feel an outpatient venue is appropriate.     Gloris Manchester. Lazarus Salines, M.D.     KTW/MEDQ  D:  04/08/2011  T:  04/08/2011  Job:  [id number]  Electronically Signed by Flo Shanks M.D. on 04/15/2011 04:43:28 PM

## 2011-04-24 ENCOUNTER — Ambulatory Visit (INDEPENDENT_AMBULATORY_CARE_PROVIDER_SITE_OTHER): Payer: BC Managed Care – PPO | Admitting: Family Medicine

## 2011-04-24 ENCOUNTER — Encounter: Payer: Self-pay | Admitting: Family Medicine

## 2011-04-24 VITALS — BP 140/82 | Temp 98.4°F | Wt 194.0 lb

## 2011-04-24 DIAGNOSIS — N9489 Other specified conditions associated with female genital organs and menstrual cycle: Secondary | ICD-10-CM

## 2011-04-24 DIAGNOSIS — N898 Other specified noninflammatory disorders of vagina: Secondary | ICD-10-CM

## 2011-04-24 MED ORDER — PRAVASTATIN SODIUM 40 MG PO TABS: 40.0000 mg | ORAL_TABLET | Freq: Every day | ORAL | Status: DC

## 2011-04-24 NOTE — Progress Notes (Signed)
  Subjective:    Patient ID: [name] [name], female    DOB: [birth year], 55 y.o.   MRN: [id number]  HPI Patient presents with some mild irritation right vagina wall. She relates history of diarrhea with onset 04/19/2011. Those symptoms resolved yesterday and she thought initially this may be related. She has not had any vaginal bleeding but has had some burning right vaginal wall. No discharge. No dysuria. Husband noted polypoid type growth yesterday. Patient had Pap smear last March which was normal. No appetite or weight changes.  Past Medical History  Diagnosis Date  . GERD (gastroesophageal reflux disease)   . MVP (mitral valve prolapse)     history of   . Hyperlipidemia   . Abnormal glucose   . Tobacco abuse     tried Chantix- caused agression  . Osteoporosis   . Anxiety   . Esophageal stricture     Dr.Dora Juanda Chance  . History of colonic polyps    Past Surgical History  Procedure Date  . Breast biopsy 2000  . Appendectomy 2008    reports that she has been smoking.  She does not have any smokeless tobacco history on file. She reports that she does not drink alcohol. Her drug history not on file. family history includes Alcohol abuse in her father; Cirrhosis in her father; Hyperlipidemia in an unspecified family member; Hypertension in an unspecified family member; and Other in an unspecified family member. Allergies  Allergen Reactions  . Penicillins     REACTION: whelps, ,skin hot to touch      Review of Systems  Constitutional: Negative for fever, chills, appetite change and unexpected weight change.  Respiratory: Negative for cough and shortness of breath.   Cardiovascular: Negative for chest pain.  Genitourinary: Positive for genital sores and vaginal pain. Negative for dysuria, hematuria, vaginal bleeding and pelvic pain.       Objective:   Physical Exam  Constitutional: She appears well-developed and well-nourished.  Cardiovascular: Normal rate, regular rhythm and  normal heart sounds.   Pulmonary/Chest: Effort normal and breath sounds normal. No respiratory distress. She has no wheezes. She has no rales.  Genitourinary:       Normal external genitalia. Patient has polypoid type growth right vaginal wall which is approximately 5 mm diameter. No active bleeding. Somewhat indurated near base          Assessment & Plan:  Polypoid mass right vaginal wall. Referral to GYN for consideration of biopsy

## 2011-04-25 ENCOUNTER — Telehealth: Payer: Self-pay | Admitting: Family Medicine

## 2011-04-25 NOTE — Telephone Encounter (Signed)
Polyp we saw yesterday should not be causing any severe pain.  OK to prescribe limited Vicodin 5/325 mg 1-2 po q 6 hours prn #20 with no refill. See if they can move her appt up.

## 2011-04-25 NOTE — Telephone Encounter (Signed)
Pt has vaginal mass in severe pain requesting pain med [facility] [phone]. Pt has gyn appt with dr Jennette Kettle 05-07-2011. Pt would like a sooner ov.

## 2011-04-26 MED ORDER — HYDROCODONE-ACETAMINOPHEN 5-325 MG PO TABS: 1.0000 | ORAL_TABLET | Freq: Four times a day (QID) | ORAL | Status: AC | PRN

## 2011-04-26 NOTE — Telephone Encounter (Signed)
Rx called in, I tried to get pt in sooner, was informed several providers out of office next week, 10/23 still soonest available, pt informed

## 2011-04-29 LAB — URINE CULTURE

## 2011-04-29 LAB — COMPREHENSIVE METABOLIC PANEL
ALT: 14
Calcium: 9.2
Creatinine, Ser: 0.68
GFR calc non Af Amer: >60
Glucose, Bld: 100 — ABNORMAL HIGH
Sodium: 140
Total Protein: 6.6

## 2011-04-29 LAB — URINE MICROSCOPIC-ADD ON

## 2011-04-29 LAB — URINALYSIS, ROUTINE W REFLEX MICROSCOPIC
Bilirubin Urine: NEGATIVE
Glucose, UA: NEGATIVE
Protein, ur: NEGATIVE
Urobilinogen, UA: 0.2

## 2011-04-29 LAB — DIFFERENTIAL
Eosinophils Absolute: 0.2
Lymphocytes Relative: 26
Lymphs Abs: 2.8
Monocytes Relative: 9
Neutro Abs: 6.7
Neutrophils Relative %: 61

## 2011-04-29 LAB — CBC
Hemoglobin: 13.9
MCHC: 34.3
MCV: 90.8
RDW: 13.5

## 2011-05-03 ENCOUNTER — Ambulatory Visit: Payer: BC Managed Care – PPO | Attending: Otolaryngology

## 2011-05-03 DIAGNOSIS — IMO0001 Reserved for inherently not codable concepts without codable children: Secondary | ICD-10-CM | POA: Insufficient documentation

## 2011-05-03 DIAGNOSIS — R490 Dysphonia: Secondary | ICD-10-CM | POA: Insufficient documentation

## 2011-05-03 DIAGNOSIS — R491 Aphonia: Secondary | ICD-10-CM | POA: Insufficient documentation

## 2011-05-06 ENCOUNTER — Ambulatory Visit: Payer: BC Managed Care – PPO

## 2011-05-06 ENCOUNTER — Encounter: Payer: BC Managed Care – PPO | Admitting: *Deleted

## 2011-05-06 ENCOUNTER — Telehealth: Payer: Self-pay | Admitting: Internal Medicine

## 2011-05-06 MED ORDER — ESOMEPRAZOLE MAGNESIUM 40 MG PO CPDR: 40.0000 mg | DELAYED_RELEASE_CAPSULE | Freq: Two times a day (BID) | ORAL | Status: DC

## 2011-05-06 NOTE — Telephone Encounter (Signed)
rx sent in electronically 

## 2011-05-06 NOTE — Telephone Encounter (Signed)
Refill nexium 40mg  tid to [facility].

## 2011-05-08 ENCOUNTER — Ambulatory Visit: Payer: BC Managed Care – PPO

## 2011-05-10 ENCOUNTER — Encounter: Payer: Self-pay | Admitting: Internal Medicine

## 2011-05-14 ENCOUNTER — Ambulatory Visit: Payer: BC Managed Care – PPO | Admitting: *Deleted

## 2011-05-15 ENCOUNTER — Telehealth: Payer: Self-pay | Admitting: Internal Medicine

## 2011-05-15 NOTE — Telephone Encounter (Signed)
Pt  Recently switched from [employer] to [employer] and is having issues with them filling her esomeprazole (NEXIUM) 40 MG capsule they will not fill her full rx. Pt requesting you contact [employer] at [phone] Case Number [id number]. The need an over ride on quantity so she can get her whole rx filled.

## 2011-05-16 ENCOUNTER — Ambulatory Visit: Payer: BC Managed Care – PPO | Attending: Otolaryngology | Admitting: *Deleted

## 2011-05-16 DIAGNOSIS — R490 Dysphonia: Secondary | ICD-10-CM | POA: Insufficient documentation

## 2011-05-16 DIAGNOSIS — R491 Aphonia: Secondary | ICD-10-CM | POA: Insufficient documentation

## 2011-05-16 DIAGNOSIS — IMO0001 Reserved for inherently not codable concepts without codable children: Secondary | ICD-10-CM | POA: Insufficient documentation

## 2011-05-16 NOTE — Telephone Encounter (Signed)
Prior authorization approved, pt aware

## 2011-05-20 ENCOUNTER — Ambulatory Visit: Payer: BC Managed Care – PPO

## 2011-05-22 ENCOUNTER — Ambulatory Visit: Payer: BC Managed Care – PPO

## 2011-05-29 ENCOUNTER — Ambulatory Visit: Payer: BC Managed Care – PPO

## 2011-06-10 ENCOUNTER — Ambulatory Visit: Payer: BC Managed Care – PPO

## 2011-06-12 ENCOUNTER — Ambulatory Visit (INDEPENDENT_AMBULATORY_CARE_PROVIDER_SITE_OTHER): Payer: BC Managed Care – PPO | Admitting: Internal Medicine

## 2011-06-12 ENCOUNTER — Encounter: Payer: Self-pay | Admitting: Internal Medicine

## 2011-06-12 ENCOUNTER — Telehealth: Payer: Self-pay | Admitting: Family Medicine

## 2011-06-12 VITALS — BP 116/72 | HR 112 | Temp 98.4°F | Wt 196.0 lb

## 2011-06-12 DIAGNOSIS — J329 Chronic sinusitis, unspecified: Secondary | ICD-10-CM

## 2011-06-12 MED ORDER — CEFUROXIME AXETIL 500 MG PO TABS: 500.0000 mg | ORAL_TABLET | Freq: Two times a day (BID) | ORAL | Status: AC

## 2011-06-12 NOTE — Telephone Encounter (Signed)
Called pt, appt made

## 2011-06-12 NOTE — Patient Instructions (Signed)
Please call our office if your symptoms do not improve or gets worse.  

## 2011-06-12 NOTE — Telephone Encounter (Signed)
Pt called in and is sick with some sort of chest cold thing. Wants to know if there's any way she can get in with Dr. Artist Pais today, 3:30 or after.

## 2011-06-12 NOTE — Telephone Encounter (Signed)
Can come in at 4:15

## 2011-06-12 NOTE — Progress Notes (Signed)
Subjective:    Patient ID: [name] [name], female    DOB: 11/10/1955, 55 y.o.   MRN: [id number]  Cough  URI  This is a new problem. The current episode started in the past 7 days. The problem has been gradually worsening. There has been no fever. Associated symptoms include congestion, coughing and sinus pain. She has tried nothing for the symptoms.   Interval history-patient was seen by GYN for possible vaginal mass. It turned out to be benign.  She was also seen by ENT and underwent resection of vocal cord polyps. Her voice has significantly improved. Review of Systems  HENT: Positive for congestion.   Respiratory: Positive for cough.    Past Medical History  Diagnosis Date  . GERD (gastroesophageal reflux disease)   . MVP (mitral valve prolapse)     history of   . Hyperlipidemia   . Abnormal glucose   . Tobacco abuse     tried Chantix- caused agression  . Osteoporosis   . Anxiety   . Esophageal stricture     Dr.Dora Juanda Chance  . History of colonic polyps     History   Social History  . Marital Status: Married    Spouse Name: N/A    Number of Children: N/A  . Years of Education: N/A   Occupational History  . Not on file.   Social History Main Topics  . Smoking status: Smoker, Current Status Unknown  . Smokeless tobacco: Not on file   Comment: 1 PPD for 30 years  . Alcohol Use: No  . Drug Use: Not on file  . Sexually Active: Not on file   Other Topics Concern  . 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 Breast biopsy 2000  . Appendectomy 2008    Family History  Problem Relation Age of Onset  . Hyperlipidemia    . Hypertension    . Alcohol abuse Father   . Cirrhosis Father     deceased form cirrhosis  .  Other      Fh of abnormal glucose    Allergies  Allergen Reactions  . Penicillins     REACTION: whelps, ,skin hot to touch    Current Outpatient Prescriptions on File Prior to Visit  Medication Sig Dispense Refill  . albuterol (PROVENTIL HFA) 108 (90 BASE) MCG/ACT inhaler Inhale 2 puffs into the lungs 4 (four) times daily as needed.        Marland Kitchen CARAFATE 1 GM/10ML suspension TAKE 2 TEASPOONS BY MOUTH 1 HOUR BEFORE MEAL  960 mL  6  . citalopram (CELEXA) 10 MG tablet Take 1 tablet (10 mg total) by mouth daily.  90 tablet  1  . esomeprazole (NEXIUM) 40 MG capsule Take 1 capsule (40 mg total) by mouth 2 (two) times daily.  180 capsule  1  . HYDROcodone-acetaminophen (LORTAB) 7.5-500 MG/15ML solution 15 mLs every 8 (eight) hours as needed.       Marland Kitchen NASONEX 50 MCG/ACT nasal spray       . pravastatin (PRAVACHOL) 40 MG tablet Take 1 tablet (40 mg total) by mouth daily.  90 tablet  2    BP 116/72  Pulse 112  Temp(Src) 98.4 F (36.9 C) (Oral)  Wt 196  lb (88.905 kg)     Objective:   Physical Exam  Constitutional: She appears well-developed and well-nourished.  HENT:  Head: Normocephalic and atraumatic.  Right Ear: External ear normal.  Left Ear: External ear normal.       Oropharyngeal erythema  Cardiovascular: Normal rate, regular rhythm and normal heart sounds.   Pulmonary/Chest: Effort normal and breath sounds normal. No respiratory distress. She has no wheezes.  Skin: Skin is warm and dry.  Psychiatric: She has a normal mood and affect. Her behavior is normal.       Assessment & Plan:

## 2011-06-12 NOTE — Assessment & Plan Note (Addendum)
55 year old female with 1 week of sinus congestion with purulent drainage. Treat with cefuroxime 500 mg twice a day x10 days. Patient has penicillin allergy but it is strictly a rash and not anaphylaxis. We discussed low risk of cross reactivity. Patient advised to call office if symptoms persist or worsen.

## 2011-06-19 ENCOUNTER — Telehealth: Payer: Self-pay | Admitting: Internal Medicine

## 2011-06-19 MED ORDER — ALBUTEROL SULFATE HFA 108 (90 BASE) MCG/ACT IN AERS: 2.0000 | INHALATION_SPRAY | Freq: Four times a day (QID) | RESPIRATORY_TRACT | Status: DC | PRN

## 2011-06-19 NOTE — Telephone Encounter (Signed)
rx sent in electronically 

## 2011-06-19 NOTE — Telephone Encounter (Signed)
Refill- proventil hfa inhaler. Inhale 2 puffs 4 times a day as needed. Qty 6.7gm. Last fill 7.25.12

## 2011-06-21 ENCOUNTER — Telehealth: Payer: Self-pay | Admitting: *Deleted

## 2011-06-21 MED ORDER — IPRATROPIUM BROMIDE 0.03 % NA SOLN: 2.0000 | Freq: Three times a day (TID) | NASAL | Status: DC

## 2011-06-21 NOTE — Telephone Encounter (Signed)
Patient is calling because she is not feeling better.  She states that her sore throat is gone, but she still has drainage and congestion.  She would like something called in to "dry it up"?

## 2011-06-21 NOTE — Telephone Encounter (Signed)
I suggest she try atrovent nasal spray.

## 2011-06-24 ENCOUNTER — Ambulatory Visit: Payer: BC Managed Care – PPO | Attending: Otolaryngology

## 2011-06-24 DIAGNOSIS — IMO0001 Reserved for inherently not codable concepts without codable children: Secondary | ICD-10-CM | POA: Insufficient documentation

## 2011-06-24 DIAGNOSIS — R491 Aphonia: Secondary | ICD-10-CM | POA: Insufficient documentation

## 2011-06-24 DIAGNOSIS — R490 Dysphonia: Secondary | ICD-10-CM | POA: Insufficient documentation

## 2011-06-28 ENCOUNTER — Telehealth: Payer: Self-pay

## 2011-06-28 MED ORDER — LEVOFLOXACIN 500 MG PO TABS: 500.0000 mg | ORAL_TABLET | Freq: Every day | ORAL | Status: AC

## 2011-06-28 NOTE — Telephone Encounter (Signed)
Call in Levaquin 500 mg  # 10 one po qd.  No refill.  If pt still having sinus issues after taking abx, pt needs OV

## 2011-06-28 NOTE — Telephone Encounter (Signed)
Pt aware, rx sent in electronically 

## 2011-06-28 NOTE — Telephone Encounter (Signed)
Pt has been taking different medications for her sinus infection.  Pt stats her mucus is still green and pt would like another medicaiton called in.  Pt has finished the antibiotic.  Pls advise.

## 2011-08-12 ENCOUNTER — Other Ambulatory Visit: Payer: Self-pay | Admitting: Internal Medicine

## 2011-08-13 NOTE — Telephone Encounter (Signed)
rx sent in electronically 

## 2011-09-19 ENCOUNTER — Other Ambulatory Visit: Payer: Self-pay | Admitting: Internal Medicine

## 2011-11-01 ENCOUNTER — Other Ambulatory Visit: Payer: Self-pay | Admitting: Internal Medicine

## 2012-01-16 ENCOUNTER — Other Ambulatory Visit: Payer: Self-pay | Admitting: Family Medicine

## 2012-01-17 ENCOUNTER — Telehealth: Payer: Self-pay | Admitting: Internal Medicine

## 2012-01-17 NOTE — Telephone Encounter (Signed)
Patient called stating that she get her rxs through [facility] and she should have 180 pills and received 90 of her nexium and now she is out and states that now they are telling her she need a prior authorization. Patient states she has been paying for 180 and receiving 90. Please assist.

## 2012-01-17 NOTE — Telephone Encounter (Signed)
I started a prior auth

## 2012-03-23 ENCOUNTER — Encounter: Payer: Self-pay | Admitting: Internal Medicine

## 2012-03-23 ENCOUNTER — Ambulatory Visit (INDEPENDENT_AMBULATORY_CARE_PROVIDER_SITE_OTHER): Payer: BC Managed Care – PPO | Admitting: Internal Medicine

## 2012-03-23 VITALS — BP 132/82 | Temp 98.0°F | Wt 202.0 lb

## 2012-03-23 DIAGNOSIS — H6120 Impacted cerumen, unspecified ear: Secondary | ICD-10-CM | POA: Insufficient documentation

## 2012-03-23 DIAGNOSIS — R062 Wheezing: Secondary | ICD-10-CM | POA: Insufficient documentation

## 2012-03-23 DIAGNOSIS — H6980 Other specified disorders of Eustachian tube, unspecified ear: Secondary | ICD-10-CM | POA: Insufficient documentation

## 2012-03-23 DIAGNOSIS — N951 Menopausal and female climacteric states: Secondary | ICD-10-CM

## 2012-03-23 DIAGNOSIS — R5383 Other fatigue: Secondary | ICD-10-CM

## 2012-03-23 DIAGNOSIS — M549 Dorsalgia, unspecified: Secondary | ICD-10-CM

## 2012-03-23 DIAGNOSIS — R42 Dizziness and giddiness: Secondary | ICD-10-CM

## 2012-03-23 DIAGNOSIS — R5381 Other malaise: Secondary | ICD-10-CM

## 2012-03-23 DIAGNOSIS — R232 Flushing: Secondary | ICD-10-CM | POA: Insufficient documentation

## 2012-03-23 LAB — BASIC METABOLIC PANEL WITH GFR
BUN: 12 mg/dL (ref 6–23)
CO2: 26 meq/L (ref 19–32)
Calcium: 9.1 mg/dL (ref 8.4–10.5)
Chloride: 101 meq/L (ref 96–112)
Creatinine, Ser: 0.7 mg/dL (ref 0.4–1.2)
GFR: 88 mL/min
Glucose, Bld: 106 mg/dL — ABNORMAL HIGH (ref 70–99)
Potassium: 3.7 meq/L (ref 3.5–5.1)
Sodium: 137 meq/L (ref 135–145)

## 2012-03-23 LAB — CBC WITH DIFFERENTIAL/PLATELET
Basophils Absolute: 0.1 K/uL (ref 0.0–0.1)
Basophils Relative: 0.6 % (ref 0.0–3.0)
Eosinophils Absolute: 0.2 K/uL (ref 0.0–0.7)
Eosinophils Relative: 1.8 % (ref 0.0–5.0)
HCT: 45.9 % (ref 36.0–46.0)
Hemoglobin: 15.1 g/dL — ABNORMAL HIGH (ref 12.0–15.0)
Lymphocytes Relative: 22.6 % (ref 12.0–46.0)
Lymphs Abs: 2.9 K/uL (ref 0.7–4.0)
MCHC: 32.9 g/dL (ref 30.0–36.0)
MCV: 92.9 fl (ref 78.0–100.0)
Monocytes Absolute: 0.7 K/uL (ref 0.1–1.0)
Monocytes Relative: 5.8 % (ref 3.0–12.0)
Neutro Abs: 8.8 K/uL — ABNORMAL HIGH (ref 1.4–7.7)
Neutrophils Relative %: 69.2 % (ref 43.0–77.0)
Platelets: 227 K/uL (ref 150.0–400.0)
RBC: 4.94 Mil/uL (ref 3.87–5.11)
RDW: 13.6 % (ref 11.5–14.6)
WBC: 12.8 K/uL — ABNORMAL HIGH (ref 4.5–10.5)

## 2012-03-23 LAB — POCT URINALYSIS DIPSTICK
Ketones, UA: NEGATIVE
Protein, UA: NEGATIVE
Spec Grav, UA: 1.015

## 2012-03-23 LAB — HEPATIC FUNCTION PANEL
ALT: 21 U/L (ref 0–35)
Total Protein: 7.4 g/dL (ref 6.0–8.3)

## 2012-03-23 LAB — TSH: TSH: 1.3 u[IU]/mL (ref 0.35–5.50)

## 2012-03-23 MED ORDER — ESTRADIOL 0.075 MG/24HR TD PTTW: 1.0000 | MEDICATED_PATCH | TRANSDERMAL | Status: DC

## 2012-03-23 MED ORDER — MOMETASONE FUROATE 50 MCG/ACT NA SUSP: 2.0000 | Freq: Every day | NASAL | Status: DC

## 2012-03-23 NOTE — Progress Notes (Signed)
Subjective:    Patient ID: [name] [name], female    DOB: 1956-05-26, 56 y.o.   MRN: [id number]  HPI  56 year old white female with history of chronic tobacco use , hyperlipidemia and mild depression for followup. Patient reports to 4 weeks ago she was treated at urgent care clinic for possible upper ear infection. She was prescribed a Z-Pak. She was told she may have fluid in left ear.  Over the last one or 2 weeks patient reports experiencing hot flashes. She also intermittently feels lightheaded.  She has mild lower back soreness and wonders if she might have a UTI.  No change in tobacco use. She has intermittent wheezing.   Review of Systems Negative for tissue area, no fevers or chills  Past Medical History  Diagnosis Date  . GERD (gastroesophageal reflux disease)   . MVP (mitral valve prolapse)     history of   . Hyperlipidemia   . Abnormal glucose   . Tobacco abuse     tried Chantix- caused agression  . Osteoporosis   . Anxiety   . Esophageal stricture     Dr.Dora Juanda Chance  . History of colonic polyps     History   Social History  . Marital Status: Married    Spouse Name: N/A    Number of Children: N/A  . Years of Education: N/A   Occupational History  . Not on file.   Social History Main Topics  . Smoking status: Smoker, Current Status Unknown  . Smokeless tobacco: Not on file   Comment: 1 PPD for 30 years  . Alcohol Use: No  . Drug Use: Not on file  . Sexually Active: Not on file   Other Topics Concern  . 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 Breast biopsy 2000  . Appendectomy 2008    Family History  Problem Relation Age of Onset  . Hyperlipidemia    . Hypertension    . Alcohol abuse Father   .  Cirrhosis Father     deceased form cirrhosis  . Other      Fh of abnormal glucose    Allergies  Allergen Reactions  . Penicillins     REACTION: whelps, ,skin hot to touch    Current Outpatient Prescriptions on File Prior to Visit  Medication Sig Dispense Refill  . albuterol (PROVENTIL HFA) 108 (90 BASE) MCG/ACT inhaler Inhale 2 puffs into the lungs 4 (four) times daily as needed.  6.7 g  3  . aspirin 81 MG tablet Take 81 mg by mouth daily.        Marland Kitchen CARAFATE 1 GM/10ML suspension TAKE 2 TEASPOONS BY MOUTH 1 HOUR BEFORE MEAL  960 mL  6  . citalopram (CELEXA) 10 MG tablet TAKE 1 TABLET DAILY  90 tablet  1  . HYDROcodone-acetaminophen (LORTAB) 7.5-500 MG/15ML solution 15 mLs every 8 (eight) hours as needed.       Marland Kitchen ipratropium (ATROVENT) 0.03 % nasal spray Place 2 sprays into the nose 3 (three) times daily.  30 mL  2  . NEXIUM 40 MG capsule TAKE 1 CAPSULE TWICE A DAY  180 capsule  1  . pravastatin (PRAVACHOL) 40 MG tablet TAKE 1 TABLET (40MG  TOTAL) DAILY  90 tablet  0  . DISCONTD: estradiol (VIVELLE-DOT) 0.075 MG/24HR Place 1 patch onto the skin 2 (two) times a week.        Marland Kitchen DISCONTD: NASONEX 50 MCG/ACT nasal spray         BP 132/82  Temp 98 F (36.7 C) (Oral)  Wt 202 lb (91.627 kg)  Spirometry shows FVC 77%, Lung age 64 years     Objective:   Physical Exam  Constitutional: She is oriented to person, place, and time. She appears well-developed and well-nourished.  HENT:  Head: Normocephalic and atraumatic.  Right Ear: External ear normal.       Left cerumen impaction  Neck: Neck supple.  Cardiovascular: Normal rate, regular rhythm and normal heart sounds.   Pulmonary/Chest: Effort normal and breath sounds normal. She has no wheezes.  Abdominal: Soft. Bowel sounds are normal. She exhibits no mass.  Musculoskeletal: She exhibits no edema.  Lymphadenopathy:    She has no cervical adenopathy.  Neurological: She is alert and oriented to person, place, and time. No cranial nerve  deficit.  Skin: Skin is warm and dry.  Psychiatric: She has a normal mood and affect. Her behavior is normal.          Assessment & Plan:

## 2012-03-23 NOTE — Assessment & Plan Note (Signed)
I suspect patient's intermittent wheezing from her chronic tobacco use. Spirometry showed FEC-77%. She may have small airway obstruction. She has mild restriction. Her lung age is 72 years. Patient strongly encouraged to discontinue smoking. Use nicotine lozenges as directed.  Consider starting Advair diskus.

## 2012-03-23 NOTE — Patient Instructions (Addendum)
Our office will contact you re: blood test results Use nasonex as directed. Please call our office if your symptoms do not improve or gets worse. Use nicotine lozenges to help you quit smoking

## 2012-03-23 NOTE — Assessment & Plan Note (Signed)
I irrigated left ear and used curette to remove cerumen plug. No complications.

## 2012-03-23 NOTE — Assessment & Plan Note (Signed)
I suspect patient's intermittent dizziness from eustachian tube dysfunction.  Use Nasonex 2 sprays in each nostril as directed.  Patient advised to call office if symptoms persist or worsen.

## 2012-03-23 NOTE — Assessment & Plan Note (Signed)
Patient experiencing intermittent hot flashes. I suspect symptoms are secondary to her not using her estrogen patch regularly. Patient given refill for Vivelle-Dot patch. Consider increase dose if she has persistent symptoms.  Check CBCD.

## 2012-03-26 MED ORDER — CEFUROXIME AXETIL 500 MG PO TABS: 500.0000 mg | ORAL_TABLET | Freq: Two times a day (BID) | ORAL | Status: AC

## 2012-03-26 NOTE — Progress Notes (Signed)
Quick Note:  Left a message for return call. ______ 

## 2012-03-26 NOTE — Progress Notes (Signed)
Rx sent to pharmacy   

## 2012-03-26 NOTE — Addendum Note (Signed)
Addended by: Azucena Freed on: 03/26/2012 11:54 AM   Modules accepted: Orders

## 2012-03-27 ENCOUNTER — Telehealth: Payer: Self-pay | Admitting: Internal Medicine

## 2012-03-27 NOTE — Telephone Encounter (Signed)
Pt notified of labs.  She was calling to find out why antibiotic was called in.  She has started the medication.  She will call back 2-3 after she has stopped the medication if sx persist.

## 2012-03-27 NOTE — Telephone Encounter (Signed)
Caller: Nancyann/Patient; Phone: [phone]; Reason for Call: Patient returning someone's call regarding lab work, also stated she received a call from [facility] stating the doctor had called her something in.  Please call her back.  Thanks

## 2012-04-15 ENCOUNTER — Other Ambulatory Visit: Payer: Self-pay | Admitting: Internal Medicine

## 2012-04-27 ENCOUNTER — Other Ambulatory Visit: Payer: Self-pay | Admitting: Internal Medicine

## 2012-08-17 ENCOUNTER — Telehealth: Payer: Self-pay | Admitting: Internal Medicine

## 2012-08-17 ENCOUNTER — Ambulatory Visit (INDEPENDENT_AMBULATORY_CARE_PROVIDER_SITE_OTHER): Payer: BC Managed Care – PPO | Admitting: Internal Medicine

## 2012-08-17 ENCOUNTER — Encounter: Payer: Self-pay | Admitting: Internal Medicine

## 2012-08-17 VITALS — BP 124/74 | HR 60 | Temp 97.7°F | Wt 204.0 lb

## 2012-08-17 DIAGNOSIS — F4323 Adjustment disorder with mixed anxiety and depressed mood: Secondary | ICD-10-CM | POA: Insufficient documentation

## 2012-08-17 DIAGNOSIS — R413 Other amnesia: Secondary | ICD-10-CM

## 2012-08-17 DIAGNOSIS — H6980 Other specified disorders of Eustachian tube, unspecified ear: Secondary | ICD-10-CM

## 2012-08-17 LAB — BASIC METABOLIC PANEL
BUN: 12 mg/dL (ref 6–23)
CO2: 28 mEq/L (ref 19–32)
Chloride: 104 mEq/L (ref 96–112)
Creatinine, Ser: 0.7 mg/dL (ref 0.4–1.2)
Glucose, Bld: 97 mg/dL (ref 70–99)
Potassium: 4.8 mEq/L (ref 3.5–5.1)

## 2012-08-17 LAB — TSH: TSH: 0.54 u[IU]/mL (ref 0.35–5.50)

## 2012-08-17 MED ORDER — CITALOPRAM HYDROBROMIDE 20 MG PO TABS: 20.0000 mg | ORAL_TABLET | Freq: Every day | ORAL | Status: DC

## 2012-08-17 MED ORDER — ESOMEPRAZOLE MAGNESIUM 40 MG PO CPDR: 40.0000 mg | DELAYED_RELEASE_CAPSULE | Freq: Two times a day (BID) | ORAL | Status: DC

## 2012-08-17 NOTE — Telephone Encounter (Signed)
Patient is not feeling well. Symptoms include: inability to concentrate, ears feel full, heady a little foggy; jittery and nervous. States that anxiety is not new for her - takes Celexa 10mg  daily. She wants to know if Dr. Artist Pais can possibly work her in at all today. Please advise and let me know, or call her. His injection slot is still open.

## 2012-08-17 NOTE — Assessment & Plan Note (Signed)
Patient has bilateral full sensation of both ears. I suspect symptoms are secondary to eustachian tube dysfunction. Continue Nasonex as directed.

## 2012-08-17 NOTE — Progress Notes (Signed)
  Subjective:    Patient ID: [name] [name], female    DOB: 10/31/1955, 57 y.o.   MRN: [id number]  HPI  57 year old white female with history of adjustment disorder with depressive symptoms, borderline type 2 diabetes and anxiety complains of bilateral ear fullness for last several weeks.  She denies dizziness.  No hearing loss.  Her main complaint however is inability to focus at work. She reports increased amount of stress at work. She also has interpersonal issues with her sister.  She averages 5-6 hours of sleep per night. She usually goes to bed around 1:30 AM and gets up at 7:30 AM.   Review of Systems     Objective:   Physical Exam  Constitutional: She is oriented to person, place, and time. She appears well-developed and well-nourished.  Cardiovascular: Normal rate, regular rhythm and normal heart sounds.   No murmur heard. Pulmonary/Chest: Effort normal and breath sounds normal. She has no wheezes.  Neurological: She is alert and oriented to person, place, and time. No cranial nerve deficit.  Skin: Skin is warm and dry.  Psychiatric: She has a normal mood and affect. Her behavior is normal.          Assessment & Plan:

## 2012-08-17 NOTE — Assessment & Plan Note (Addendum)
Patient complains of difficulty focusing at work over last several months. I suspect her symptoms triggered by increases stress at work and at home. I suggest trial of higher dose of citalopram. Increase to 20 mg.  Reassess in 2 months

## 2012-08-17 NOTE — Telephone Encounter (Signed)
appt scheduled

## 2012-09-09 ENCOUNTER — Ambulatory Visit (INDEPENDENT_AMBULATORY_CARE_PROVIDER_SITE_OTHER): Payer: BC Managed Care – PPO | Admitting: Internal Medicine

## 2012-09-09 VITALS — BP 130/80 | HR 76 | Temp 98.1°F | Resp 16 | Ht 65.0 in | Wt 202.0 lb

## 2012-09-09 DIAGNOSIS — J069 Acute upper respiratory infection, unspecified: Secondary | ICD-10-CM | POA: Insufficient documentation

## 2012-09-09 NOTE — Patient Instructions (Addendum)
Gargle with warm salt water and use nasal saline as directed Please contact our office if your symptoms do not improve or gets worse.  

## 2012-09-09 NOTE — Assessment & Plan Note (Signed)
57 year old white female with resolving upper respiratory infection. Finish course of Bactrim. Her chest exam is clear. I suspect cough is from postnasal drip. I suggest patient gargle with warm salt water and use intranasal saline as directed. Patient also encouraged to discontinue smoking. Patient advised to call office if symptoms persist or worsen.

## 2012-09-09 NOTE — Progress Notes (Signed)
Subjective:    Patient ID: [name] [name], female    DOB: [birth year], 57 y.o.   MRN: [id number]  HPI  57 year old white female for urgent care followup. She was seen 3-4 days ago for upper throat congestion and ear congestion. She was diagnosed with possible ear infection and started on Bactrim DS twice daily. Patient is on day 5 of antibiotic therapy. Patient reports ear symptoms have improved. She has persistent cough. Denies shortness of breath.  Review of Systems Negative for fever or chills  Past Medical History  Diagnosis Date  . GERD (gastroesophageal reflux disease)   . MVP (mitral valve prolapse)     history of   . Hyperlipidemia   . Abnormal glucose   . Tobacco abuse     tried Chantix- caused agression  . Osteoporosis   . Anxiety   . Esophageal stricture     Dr.Dora Juanda Chance  . History of colonic polyps     History   Social History  . Marital Status: Married    Spouse Name: N/A    Number of Children: N/A  . Years of Education: N/A   Occupational History  . Not on file.   Social History Main Topics  . Smoking status: Smoker, Current Status Unknown  . Smokeless tobacco: Not on file     Comment: 1 PPD for 30 years  . Alcohol Use: No  . Drug Use: Not on file  . Sexually Active: Not on file   Other Topics Concern  . 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 Breast biopsy  2000  . Appendectomy  2008    Family History  Problem Relation Age of Onset  . Hyperlipidemia    . Hypertension    . Alcohol abuse Father   . Cirrhosis Father     deceased form cirrhosis  . Other      Fh of abnormal glucose    Allergies  Allergen Reactions  . Penicillins     REACTION: whelps, ,skin  hot to touch    Current Outpatient Prescriptions on File Prior to Visit  Medication Sig Dispense Refill  . albuterol (PROVENTIL HFA) 108 (90 BASE) MCG/ACT inhaler Inhale 2 puffs into the lungs 4 (four) times daily as needed.  6.7 g  3  . aspirin 81 MG tablet Take 81 mg by mouth daily.        Marland Kitchen CARAFATE 1 GM/10ML suspension TAKE 2 TEASPOONS BY MOUTH 1 HOUR BEFORE MEAL  960 mL  6  . Cholecalciferol (VITAMIN D3) 2000 UNITS TABS Take 1 tablet by mouth daily.      Marland Kitchen estradiol (VIVELLE-DOT) 0.075 MG/24HR Place 1 patch onto the skin 2 (two) times a week.  8 patch  5  . HYDROcodone-acetaminophen (LORTAB) 7.5-500 MG/15ML solution 15 mLs every 8 (eight) hours as needed.       Marland Kitchen ipratropium (ATROVENT) 0.03 % nasal spray Place 2 sprays into the nose 3 (three) times daily.      . mometasone (NASONEX) 50 MCG/ACT nasal spray Place 2 sprays into the nose daily.  17 g  5  . pravastatin (PRAVACHOL)  40 MG tablet TAKE 1 TABLET DAILY (NEED OFFICE VISIT)  90 tablet  3  . citalopram (CELEXA) 20 MG tablet Take 1 tablet (20 mg total) by mouth daily.  90 tablet  1  . esomeprazole (NEXIUM) 40 MG capsule Take 1 capsule (40 mg total) by mouth 2 (two) times daily.  180 capsule  1   No current facility-administered medications on file prior to visit.    BP 130/80  Pulse 76  Temp(Src) 98.1 F (36.7 C)  Resp 16  Ht 5\' 5"  (1.651 m)  Wt 202 lb (91.627 kg)  BMI 33.61 kg/m2       Objective:   Physical Exam  Constitutional: She appears well-developed and well-nourished.  HENT:  Head: Normocephalic and atraumatic.  Right Ear: External ear normal.  Left Ear: External ear normal.  Oropharyngeal erythema with signs of postnasal drip  Neck: Neck supple.  Cardiovascular: Normal rate, regular rhythm and normal heart sounds.   Pulmonary/Chest: Effort normal and breath sounds normal. She has no wheezes.  Lymphadenopathy:    She has no cervical adenopathy.          Assessment & Plan:

## 2012-10-26 ENCOUNTER — Telehealth: Payer: Self-pay | Admitting: Internal Medicine

## 2012-10-26 MED ORDER — ESOMEPRAZOLE MAGNESIUM 40 MG PO CPDR: 40.0000 mg | DELAYED_RELEASE_CAPSULE | Freq: Two times a day (BID) | ORAL | Status: DC

## 2012-10-26 NOTE — Telephone Encounter (Signed)
rx sent in electronically 

## 2012-10-26 NOTE — Telephone Encounter (Signed)
Patient needs refills on esomeprazole (NEXIUM) 40 MG capsule BID. #180 for 90 with 3 refills.  She NO LONGER uses [facility] mail order.  [facility] to [facility] [address]

## 2012-10-31 ENCOUNTER — Encounter (HOSPITAL_COMMUNITY): Payer: Self-pay | Admitting: *Deleted

## 2012-10-31 ENCOUNTER — Observation Stay (HOSPITAL_COMMUNITY)
Admission: EM | Admit: 2012-10-31 | Discharge: 2012-11-01 | Disposition: A | Discharge status: Home / Self Care | Payer: BC Managed Care – PPO | Attending: Internal Medicine | Admitting: Internal Medicine

## 2012-10-31 DIAGNOSIS — E876 Hypokalemia: Secondary | ICD-10-CM | POA: Insufficient documentation

## 2012-10-31 DIAGNOSIS — E8729 Other acidosis: Secondary | ICD-10-CM

## 2012-10-31 DIAGNOSIS — E872 Acidosis, unspecified: Secondary | ICD-10-CM | POA: Insufficient documentation

## 2012-10-31 DIAGNOSIS — K219 Gastro-esophageal reflux disease without esophagitis: Secondary | ICD-10-CM | POA: Diagnosis present

## 2012-10-31 DIAGNOSIS — F4323 Adjustment disorder with mixed anxiety and depressed mood: Secondary | ICD-10-CM

## 2012-10-31 DIAGNOSIS — A088 Other specified intestinal infections: Principal | ICD-10-CM | POA: Insufficient documentation

## 2012-10-31 DIAGNOSIS — R109 Unspecified abdominal pain: Secondary | ICD-10-CM | POA: Insufficient documentation

## 2012-10-31 DIAGNOSIS — R7309 Other abnormal glucose: Secondary | ICD-10-CM | POA: Diagnosis present

## 2012-10-31 DIAGNOSIS — F411 Generalized anxiety disorder: Secondary | ICD-10-CM

## 2012-10-31 DIAGNOSIS — N19 Unspecified kidney failure: Secondary | ICD-10-CM

## 2012-10-31 DIAGNOSIS — R112 Nausea with vomiting, unspecified: Secondary | ICD-10-CM | POA: Diagnosis present

## 2012-10-31 DIAGNOSIS — F17200 Nicotine dependence, unspecified, uncomplicated: Secondary | ICD-10-CM | POA: Diagnosis present

## 2012-10-31 DIAGNOSIS — R197 Diarrhea, unspecified: Secondary | ICD-10-CM | POA: Insufficient documentation

## 2012-10-31 DIAGNOSIS — E860 Dehydration: Secondary | ICD-10-CM

## 2012-10-31 DIAGNOSIS — Z23 Encounter for immunization: Secondary | ICD-10-CM | POA: Insufficient documentation

## 2012-10-31 LAB — CBC WITH DIFFERENTIAL/PLATELET
HCT: 46.9 % — ABNORMAL HIGH (ref 36.0–46.0)
Hemoglobin: 16.7 g/dL — ABNORMAL HIGH (ref 12.0–15.0)
Lymphocytes Relative: 9 % — ABNORMAL LOW (ref 12–46)
Lymphs Abs: 1.1 10*3/uL (ref 0.7–4.0)
MCHC: 35.6 g/dL (ref 30.0–36.0)
Monocytes Absolute: 0.6 10*3/uL (ref 0.1–1.0)
Monocytes Relative: 5 % (ref 3–12)
Neutro Abs: 11.1 10*3/uL — ABNORMAL HIGH (ref 1.7–7.7)
Neutrophils Relative %: 87 % — ABNORMAL HIGH (ref 43–77)
RBC: 5.31 MIL/uL — ABNORMAL HIGH (ref 3.87–5.11)
WBC: 12.8 10*3/uL — ABNORMAL HIGH (ref 4.0–10.5)

## 2012-10-31 LAB — COMPREHENSIVE METABOLIC PANEL
Alkaline Phosphatase: 93 U/L (ref 39–117)
BUN: 25 mg/dL — ABNORMAL HIGH (ref 6–23)
CO2: 20 mEq/L (ref 19–32)
Chloride: 94 mEq/L — ABNORMAL LOW (ref 96–112)
Creatinine, Ser: 0.91 mg/dL (ref 0.50–1.10)
GFR calc non Af Amer: 70 mL/min — ABNORMAL LOW (ref >90)
Potassium: 4.1 mEq/L (ref 3.5–5.1)
Total Bilirubin: 0.7 mg/dL (ref 0.3–1.2)

## 2012-10-31 LAB — CBC
HCT: 45.2 % (ref 36.0–46.0)
MCHC: 35 g/dL (ref 30.0–36.0)
MCV: 89 fL (ref 78.0–100.0)
Platelets: 184 10*3/uL (ref 150–400)
RDW: 13.4 % (ref 11.5–15.5)
WBC: 9.3 10*3/uL (ref 4.0–10.5)

## 2012-10-31 LAB — CREATININE, SERUM
GFR calc Af Amer: >90 mL/min (ref >90)
GFR calc non Af Amer: 78 mL/min — ABNORMAL LOW (ref >90)

## 2012-10-31 LAB — LACTIC ACID, PLASMA: Lactic Acid, Venous: 1 mmol/L (ref 0.5–2.2)

## 2012-10-31 MED ORDER — ONDANSETRON HCL 4 MG/2ML IJ SOLN
4.0000 mg | Freq: Once | INTRAMUSCULAR | Status: AC
  Administered 2012-10-31: 4 mg via INTRAVENOUS
  Filled 2012-10-31: qty 2

## 2012-10-31 MED ORDER — SODIUM CHLORIDE 0.9 % IV SOLN: INTRAVENOUS | Status: DC

## 2012-10-31 MED ORDER — CITALOPRAM HYDROBROMIDE 20 MG PO TABS
20.0000 mg | ORAL_TABLET | Freq: Every day | ORAL | Status: DC
  Administered 2012-11-01: 20 mg via ORAL
  Filled 2012-10-31: qty 1

## 2012-10-31 MED ORDER — PANTOPRAZOLE SODIUM 40 MG PO TBEC: 40.0000 mg | DELAYED_RELEASE_TABLET | Freq: Every day | ORAL | Status: DC

## 2012-10-31 MED ORDER — HEPARIN SODIUM (PORCINE) 5000 UNIT/ML IJ SOLN
5000.0000 [IU] | Freq: Three times a day (TID) | INTRAMUSCULAR | Status: DC
  Administered 2012-10-31 – 2012-11-01 (×3): 5000 [IU] via SUBCUTANEOUS
  Filled 2012-10-31 (×6): qty 1

## 2012-10-31 MED ORDER — SODIUM CHLORIDE 0.9 % IV SOLN
INTRAVENOUS | Status: DC
  Administered 2012-10-31: 17:00:00 via INTRAVENOUS

## 2012-10-31 MED ORDER — ALBUTEROL SULFATE HFA 108 (90 BASE) MCG/ACT IN AERS
2.0000 | INHALATION_SPRAY | Freq: Four times a day (QID) | RESPIRATORY_TRACT | Status: DC | PRN
  Administered 2012-11-01: 2 via RESPIRATORY_TRACT
  Filled 2012-10-31 (×3): qty 6.7

## 2012-10-31 MED ORDER — PNEUMOCOCCAL VAC POLYVALENT 25 MCG/0.5ML IJ INJ
0.5000 mL | INJECTION | INTRAMUSCULAR | Status: AC
  Administered 2012-11-01: 0.5 mL via INTRAMUSCULAR
  Filled 2012-10-31 (×2): qty 0.5

## 2012-10-31 MED ORDER — ONDANSETRON HCL 4 MG/2ML IJ SOLN: 4.0000 mg | Freq: Three times a day (TID) | INTRAMUSCULAR | Status: AC | PRN

## 2012-10-31 MED ORDER — SODIUM CHLORIDE 0.9 % IV BOLUS (SEPSIS)
500.0000 mL | Freq: Once | INTRAVENOUS | Status: AC
  Administered 2012-10-31: 500 mL via INTRAVENOUS

## 2012-10-31 MED ORDER — FLUTICASONE PROPIONATE 50 MCG/ACT NA SUSP
1.0000 | Freq: Every day | NASAL | Status: DC
  Administered 2012-10-31 – 2012-11-01 (×2): 1 via NASAL
  Filled 2012-10-31: qty 16

## 2012-10-31 MED ORDER — LOPERAMIDE HCL 2 MG PO CAPS
2.0000 mg | ORAL_CAPSULE | Freq: Once | ORAL | Status: AC
  Administered 2012-10-31: 2 mg via ORAL
  Filled 2012-10-31: qty 1

## 2012-10-31 MED ORDER — FENTANYL CITRATE 0.05 MG/ML IJ SOLN
50.0000 ug | Freq: Once | INTRAMUSCULAR | Status: AC
  Administered 2012-10-31: 50 ug via INTRAVENOUS
  Filled 2012-10-31: qty 2

## 2012-10-31 MED ORDER — NON FORMULARY: 40.0000 mg | Freq: Two times a day (BID) | Status: DC

## 2012-10-31 MED ORDER — OMEPRAZOLE 20 MG PO CPDR
40.0000 mg | DELAYED_RELEASE_CAPSULE | Freq: Two times a day (BID) | ORAL | Status: DC
  Filled 2012-10-31 (×4): qty 2

## 2012-10-31 MED ORDER — ACETAMINOPHEN 325 MG PO TABS
650.0000 mg | ORAL_TABLET | Freq: Four times a day (QID) | ORAL | Status: DC | PRN
  Administered 2012-10-31 – 2012-11-01 (×3): 650 mg via ORAL
  Filled 2012-10-31 (×3): qty 2

## 2012-10-31 MED ORDER — ESTRADIOL 0.05 MG/24HR TD PTWK
0.0500 mg | MEDICATED_PATCH | TRANSDERMAL | Status: DC
  Administered 2012-11-01: 0.05 mg via TRANSDERMAL
  Filled 2012-10-31: qty 1

## 2012-10-31 NOTE — ED Notes (Signed)
EDP at the bedside.  ?

## 2012-10-31 NOTE — ED Notes (Signed)
Pt also reports left-sided abd pain that started last night.

## 2012-10-31 NOTE — Progress Notes (Signed)
Altagracia Rone [facility] [id number] Admission Data: 10/31/2012 3:53 PM Attending Provider: Rhetta Mura, MD  UXL:[id number] Artist Pais, DO Consults/ Treatment Team:    [name] [name] is a 57 y.o. female patient admitted from ED awake, alert  & orientated  X 3,  Full Code, VSS - Blood pressure 121/69, pulse 86, temperature 98.7 F (37.1 C), temperature source Oral, resp. rate 18, height 5\' 5"  (1.651 m), weight 91.8 kg (202 lb 6.1 oz), SpO2 93.00%., no c/o shortness of breath, no c/o chest pain, no distress noted.   IV site WDL:  hand right, condition patent and no redness with a transparent dsg that's clean dry and intact.  Allergies:   Allergies  Allergen Reactions  . Penicillins     REACTION: whelps, ,skin hot to touch     Past Medical History  Diagnosis Date  . GERD (gastroesophageal reflux disease)   . MVP (mitral valve prolapse)     history of   . Hyperlipidemia   . Abnormal glucose   . Tobacco abuse     tried Chantix- caused agression  . Osteoporosis   . Anxiety   . Esophageal stricture     Dr.Dora Juanda Chance  . History of colonic polyps     History:  obtained from the patient. Tobacco/alcohol: Smoked 1 packs per day for 30 years none  Pt orientation to unit, room and routine. Information packet given to patient/family and safety video watched.  Admission INP armband ID verified with patient/family, and in place. SR up x 2, fall risk assessment complete with Patient and family verbalizing understanding of risks associated with falls. Pt verbalizes an understanding of how to use the call bell and to call for help before getting out of bed.  Skin, clean-dry- intact without evidence of bruising, or skin tears.   No evidence of skin break down noted on exam. no rashes, no ecchymoses, no petechiae    Will cont to monitor and assist as needed.  Josealberto Montalto Consuella Lose, RN 10/31/2012 3:53 PM

## 2012-10-31 NOTE — ED Provider Notes (Signed)
History     CSN: [id number]  Arrival date & time 10/31/12  6213   First MD Initiated Contact with Patient 10/31/12 [phone]      Chief Complaint  Patient presents with  . Abdominal Pain  . Emesis  . Diarrhea     HPI Patient began having nausea vomiting and diarrhea around 7:00 last night.  Symptoms are now confined to mostly diarrhea.  Patient has had several episodes with watery diarrhea.  She denies blood or bloody diarrhea.  No household contacts that she is aware of.  Patient last ate pizza there has been aching he is asymptomatic. Past Medical History  Diagnosis Date  . GERD (gastroesophageal reflux disease)   . MVP (mitral valve prolapse)     history of   . Hyperlipidemia   . Abnormal glucose   . Tobacco abuse     tried Chantix- caused agression  . Osteoporosis   . Anxiety   . Esophageal stricture     Dr.Dora Juanda Chance  . History of colonic polyps     Past Surgical History  Procedure Laterality Date  . Breast biopsy  2000  . Appendectomy  2008    Family History  Problem Relation Age of Onset  . Hyperlipidemia    . Hypertension    . Alcohol abuse Father   . Cirrhosis Father     deceased form cirrhosis  . Other      Fh of abnormal glucose    History  Substance Use Topics  . Smoking status: Smoker, Current Status Unknown -- 1.50 packs/day for 30 years    Start date: 07/15/1978  . Smokeless tobacco: Not on file     Comment: 1 PPD for 30 years  . Alcohol Use: No    OB History   Grav Para Term Preterm Abortions TAB SAB Ect Mult Living                  Review of Systems All other systems reviewed and are negative Allergies  Penicillins  Home Medications   Current Outpatient Rx  Name  Route  Sig  Dispense  Refill  . albuterol (PROVENTIL HFA;VENTOLIN HFA) 108 (90 BASE) MCG/ACT inhaler   Inhalation   Inhale 2 puffs into the lungs every 6 (six) hours as needed for wheezing.         . citalopram (CELEXA) 20 MG tablet   Oral   Take 20 mg by mouth  daily.         Marland Kitchen esomeprazole (NEXIUM) 40 MG capsule   Oral   Take 40 mg by mouth 2 (two) times daily.         Marland Kitchen estradiol (VIVELLE-DOT) 0.075 MG/24HR   Transdermal   Place 1 patch onto the skin 2 (two) times a week.         . mometasone (NASONEX) 50 MCG/ACT nasal spray   Nasal   Place 2 sprays into the nose daily.           BP 96/64  Pulse 74  Temp(Src) 97.9 F (36.6 C) (Oral)  Resp 16  Ht 5\' 5"  (1.651 m)  Wt 202 lb 6.1 oz (91.8 kg)  BMI 33.68 kg/m2  SpO2 96%  Physical Exam  Nursing note and vitals reviewed. Constitutional: She is oriented to person, place, and time. She appears well-developed and well-nourished. She has a sickly appearance. No distress.  HENT:  Head: Normocephalic and atraumatic.  Eyes: Pupils are equal, round, and reactive to light.  Neck:  Normal range of motion.  Cardiovascular: Normal rate and intact distal pulses.   Pulmonary/Chest: No respiratory distress.  Abdominal: Normal appearance. She exhibits no distension.  Musculoskeletal: Normal range of motion.  Neurological: She is alert and oriented to person, place, and time. No cranial nerve deficit.  Skin: Skin is warm and dry. No rash noted.  Psychiatric: She has a normal mood and affect. Her behavior is normal.    ED Course  Procedures (including critical care time) Meds ordered this encounter  Medications  . sodium chloride 0.9 % bolus 500 mL    Sig:   . 0.9 %  sodium chloride infusion    Sig:   . ondansetron (ZOFRAN) injection 4 mg    Sig:   . loperamide (IMODIUM) capsule 2 mg    Sig:   . fentaNYL (SUBLIMAZE) injection 50 mcg    Sig:     Labs Reviewed  CBC WITH DIFFERENTIAL - Abnormal; Notable for the following:    WBC 12.8 (*)    RBC 5.31 (*)    Hemoglobin 16.7 (*)    HCT 46.9 (*)    Neutrophils Relative 87 (*)    Neutro Abs 11.1 (*)    Lymphocytes Relative 9 (*)    All other components within normal limits  COMPREHENSIVE METABOLIC PANEL - Abnormal; Notable for  the following:    Sodium 133 (*)    Chloride 94 (*)    Glucose, Bld 110 (*)    BUN 25 (*)    GFR calc non Af Amer 70 (*)    GFR calc Af Amer 81 (*)    All other components within normal limits  CBC - Abnormal; Notable for the following:    Hemoglobin 15.8 (*)    All other components within normal limits  CREATININE, SERUM - Abnormal; Notable for the following:    GFR calc non Af Amer 78 (*)    All other components within normal limits  COMPREHENSIVE METABOLIC PANEL - Abnormal; Notable for the following:    Potassium 3.3 (*)    Glucose, Bld 107 (*)    Calcium 7.7 (*)    Albumin 3.2 (*)    AST 41 (*)    All other components within normal limits  CLOSTRIDIUM DIFFICILE BY PCR  LACTIC ACID, PLASMA  CBC    Anion gap = 19 No results found.   1. Dehydration   2. Uremia   3. High anion gap metabolic acidosis   4. Adjustment disorder with mixed anxiety and depressed mood   5. Anxiety state, unspecified   6. Nausea and vomiting       MDM          Nelia Shi, MD 11/01/12 2042

## 2012-10-31 NOTE — ED Notes (Signed)
Reports onset of abd pain and n/v/d since last night 7pm.

## 2012-10-31 NOTE — Progress Notes (Deleted)
[facility] History and Physical  [name] [name] [facility] ZOX:[id number] DOB: 05-22-1956 DOA: 10/31/2012  Referring physician: Marzetta Merino PCP: Thomos Lemons, DO  Specialists: none  Chief Complaint: n/v and watery diarrhea  HPI: [name] [name] is a 57 y.o. female  Who staretd to experience cramping in her abdomen yesterday afternoon.  She developed some n/v subsequently starting about 18:00 and had about 5 episodes of vomit, non-feculent. No-bilious no blood.  SHe took some nexium and some pepto-bismol but these didn;t seem to help Around 10pm she staretd to have diarrea and it was no-stop all night long-no blood-seemed dark watery and seeemd very liquid-she and herhusband shared a [employer] pizza-several people ate this.   NO recent antibiotic exposure.-she does however take Nexium 40 bid-Patient's nephew had been sick the night before  Review of Systems: The patient denies chest pain shortness of breath blurred vision double vision urine incontinence vaginal discharge upper GI bleeding or lower GI bleeding falls weakness although shots will orthostatic earlier today  Past Medical History  Diagnosis Date  . GERD (gastroesophageal reflux disease)   . MVP (mitral valve prolapse)     history of   . Hyperlipidemia   . Abnormal glucose   . Tobacco abuse     tried Chantix- caused agression  . Osteoporosis   . Anxiety   . Esophageal stricture     Dr.Dora Juanda Chance  . History of colonic polyps    Past Surgical History  Procedure Laterality Date  . Breast biopsy  2000  . Appendectomy  2008   Social History:  reports that she has been smoking.  She does not have any smokeless tobacco history on file. She reports that she does not drink alcohol. Her drug history is not on file. Patient lives at home with her husband. 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 Penicillins     REACTION: whelps, ,skin hot to touch    Family History  Problem Relation Age of  Onset  . Hyperlipidemia    . Hypertension    . Alcohol abuse Father   . Cirrhosis Father     deceased form cirrhosis  . Other      Fh of abnormal glucose     Prior to Admission medications   Medication Sig Start Date End Date Taking? Authorizing Provider  albuterol (PROVENTIL HFA;VENTOLIN HFA) 108 (90 BASE) MCG/ACT inhaler Inhale 2 puffs into the lungs every 6 (six) hours as needed for wheezing.   Yes Historical Provider, MD  citalopram (CELEXA) 20 MG tablet Take 20 mg by mouth daily.   Yes Historical Provider, MD  esomeprazole (NEXIUM) 40 MG capsule Take 40 mg by mouth 2 (two) times daily.   Yes Historical Provider, MD  estradiol (VIVELLE-DOT) 0.075 MG/24HR Place 1 patch onto the skin 2 (two) times a week.   Yes Historical Provider, MD  mometasone (NASONEX) 50 MCG/ACT nasal spray Place 2 sprays into the nose daily.   Yes Historical Provider, MD   Physical Exam: Filed Vitals:   10/31/12 0935 10/31/12 1331  BP: 118/73 117/57  Pulse: 96 85  Temp: 98.3 F (36.8 C) 98.8 F (37.1 C)  TempSrc: Oral Oral  Resp: 22 18  SpO2: 93% 93%     General:  Alert pleasant oriented  Eyes: No pallor no icterus  ENT: Clear  Neck: Soft supple  Cardiovascular: S1-S2 no murmur patella  Respiratory: Clinically clear  Abdomen: Soft nontender no rebound no guarding  Skin: No rash  Musculoskeletal: Range of motion grossly intact  Psychiatric: Euthymic  Neurologic: Grossly intact  Labs on Admission:  Basic Metabolic Panel:  Recent Labs Lab 10/31/12 1055  NA 133*  K 4.1  CL 94*  CO2 20  GLUCOSE 110*  BUN 25*  CREATININE 0.91  CALCIUM 9.5   Liver Function Tests:  Recent Labs Lab 10/31/12 1055  AST 27  ALT 23  ALKPHOS 93  BILITOT 0.7  PROT 8.3  ALBUMIN 4.1   No results found for this basename: LIPASE, AMYLASE,  in the last 168 hours No results found for this basename: AMMONIA,  in the last 168 hours CBC:  Recent Labs Lab 10/31/12 1105  WBC 12.8*  NEUTROABS  11.1*  HGB 16.7*  HCT 46.9*  MCV 88.3  PLT 220   Cardiac Enzymes: No results found for this basename: CKTOTAL, CKMB, CKMBINDEX, TROPONINI,  in the last 168 hours  BNP (last 3 results) No results found for this basename: PROBNP,  in the last 8760 hours CBG: No results found for this basename: GLUCAP,  in the last 168 hours  Radiological Exams on Admission: No results found.  EKG: Independently reviewed. none  Assessment/Plan Active Problems:   * No active hospital problems. *   1. Likely viral gastroenteritis-symptoms seem to be already resolving with her having no further vomiting or nausea. Given she has mild volume depletion and still does not feel very well I think it is reasonable to admit her overnight. She'll continue on IV fluids at 125 cc an hour and we will place on a clear liquid diet with goals to advanced to a solid diet by morning-if her stool persistently is present, we'll get a C. difficile panel, viral GI pathogen panel as well 2. Reflux-I've ordered pantoprazole for her. She realizes that if she continues to have diarrhea this may need to be discontinued if C. difficile becomes apparent 3. Depression continue citalopram 20 mg daily 4. Seasonal allergies- continue nasal spray  No consult called Full code Discussed with husband at bedside Anticipate one to 2 days obs status med-surg  Time spent: 2  Mahala Menghini [facility] Pager [phone]  If 7PM-7AM, please contact night-coverage [email or web address] Password [facility] 10/31/2012, 1:43 PM

## 2012-11-01 DIAGNOSIS — R112 Nausea with vomiting, unspecified: Secondary | ICD-10-CM | POA: Diagnosis present

## 2012-11-01 DIAGNOSIS — F4323 Adjustment disorder with mixed anxiety and depressed mood: Secondary | ICD-10-CM

## 2012-11-01 DIAGNOSIS — F411 Generalized anxiety disorder: Secondary | ICD-10-CM

## 2012-11-01 LAB — COMPREHENSIVE METABOLIC PANEL
ALT: 28 U/L (ref 0–35)
AST: 41 U/L — ABNORMAL HIGH (ref 0–37)
CO2: 26 mEq/L (ref 19–32)
Calcium: 7.7 mg/dL — ABNORMAL LOW (ref 8.4–10.5)
GFR calc non Af Amer: >90 mL/min (ref >90)
Sodium: 139 mEq/L (ref 135–145)
Total Protein: 6.5 g/dL (ref 6.0–8.3)

## 2012-11-01 LAB — CBC
HCT: 41.9 % (ref 36.0–46.0)
Hemoglobin: 14 g/dL (ref 12.0–15.0)
MCH: 30 pg (ref 26.0–34.0)
MCHC: 33.4 g/dL (ref 30.0–36.0)

## 2012-11-01 MED ORDER — POTASSIUM CHLORIDE CRYS ER 20 MEQ PO TBCR
60.0000 meq | EXTENDED_RELEASE_TABLET | Freq: Once | ORAL | Status: AC
  Administered 2012-11-01: 60 meq via ORAL
  Filled 2012-11-01: qty 3

## 2012-11-01 MED ORDER — ESOMEPRAZOLE MAGNESIUM 40 MG PO CPDR
40.0000 mg | DELAYED_RELEASE_CAPSULE | Freq: Two times a day (BID) | ORAL | Status: DC
Start: 2012-11-01 — End: 2012-11-01
  Administered 2012-11-01: 40 mg via ORAL
  Filled 2012-11-01: qty 1

## 2012-11-01 NOTE — Discharge Summary (Signed)
Physician Discharge Summary  Raul Winterhalter [facility] ZOX:[id number] DOB: [birth year] DOA: 10/31/2012  PCP: Thomos Lemons, DO  Admit date: 10/31/2012 Discharge date: 11/01/2012  Time spent: 40 minutes  Recommendations for Outpatient Follow-up:  1. Followup with primary care physician one week the  Discharge Diagnoses:  Active Problems:   TOBACCO ABUSE   GERD   DIABETES MELLITUS, TYPE II, BORDERLINE   Nausea and vomiting   Discharge Condition: Stable  Diet recommendation: Regular diet  Filed Weights   10/31/12 1506  Weight: 91.8 kg (202 lb 6.1 oz)    History of present illness:  [name] [name] is a 57 y.o. female  Who staretd to experience cramping in her abdomen yesterday afternoon. She developed some n/v subsequently starting about 18:00 and had about 5 episodes of vomit, non-feculent. No-bilious no blood. SHe took some nexium and some pepto-bismol but these didn;t seem to help  Around 10pm she staretd to have diarrea and it was no-stop all night long-no blood-seemed dark watery and seeemd very liquid-she and herhusband shared a [employer] pizza-several people ate this. NO recent antibiotic exposure.-she does however take Nexium 40 bid-Patient's nephew had been sick the night before  Hospital Course:   1. Nausea and vomiting: Patient presented to the hospital with nausea vomiting as well as diarrhea for one day, symptoms are consistent with transient viral gastroenteritis. Patient already improving when she is admitted to the hospital, she was placed on IV fluids for hydration, her x-rays and abdominal examination shows benign abdomen. Patient's symptoms resolved, her diet advanced from clear liquids today she tolerated that well, she'll be discharged home, with followup with primary care physician in one week.  2. GERD: Patient is on Nexium, no recent symptoms of exacerbation continue preadmission oral medications.  3. Hypokalemia: Secondary to GI losses, this is repleted with oral supplementations.  On discharge her potassium 3.4 which she got 60 mEq of potassium, followup as outpatient.  4. Tobacco abuse disorder: Patient counseled extensively.  Procedures:  None  Consultations:  None  Discharge Exam: Filed Vitals:   10/31/12 1445 10/31/12 1506 10/31/12 2100 11/01/12 0500  BP: 92/46 121/69 98/59 96/64   Pulse: 82 86 64 74  Temp:  98.7 F (37.1 C) 97.9 F (36.6 C) 97.9 F (36.6 C)  TempSrc:   Oral Oral  Resp:  18 14 16   Height:  5\' 5"  (1.651 m)    Weight:  91.8 kg (202 lb 6.1 oz)    SpO2: 96% 93% 89% 96%   General: Alert and awake, oriented x3, not in any acute distress. HEENT: anicteric sclera, pupils reactive to light and accommodation, EOMI CVS: S1-S2 clear, no murmur rubs or gallops Chest: clear to auscultation bilaterally, no wheezing, rales or rhonchi Abdomen: soft nontender, nondistended, normal bowel sounds, no organomegaly Extremities: no cyanosis, clubbing or edema noted bilaterally Neuro: Cranial nerves II-XII intact, no focal neurological deficits  Discharge Instructions     Medication List    TAKE these medications       albuterol 108 (90 BASE) MCG/ACT inhaler  Commonly known as:  PROVENTIL HFA;VENTOLIN HFA  Inhale 2 puffs into the lungs every 6 (six) hours as needed for wheezing.     citalopram 20 MG tablet  Commonly known as:  CELEXA  Take 20 mg by mouth daily.     esomeprazole 40 MG capsule  Commonly known as:  NEXIUM  Take 40 mg by mouth 2 (two) times daily.     estradiol 0.075 MG/24HR  Commonly known as:  VIVELLE-DOT  Place 1 patch onto the skin 2 (two) times a week.     mometasone 50 MCG/ACT nasal spray  Commonly known as:  NASONEX  Place 2 sprays into the nose daily.          The results of significant diagnostics from this hospitalization (including imaging, microbiology, ancillary and laboratory) are listed below for reference.    Significant Diagnostic Studies: No results found.  Microbiology: No results found for  this or any previous visit (from the past 240 hour(s)).   Labs: Basic Metabolic Panel:  Recent Labs Lab 10/31/12 1055 10/31/12 1600 11/01/12 0620  NA 133*  --  139  K 4.1  --  3.3*  CL 94*  --  104  CO2 20  --  26  GLUCOSE 110*  --  107*  BUN 25*  --  11  CREATININE 0.91 0.83 0.62  CALCIUM 9.5  --  7.7*   Liver Function Tests:  Recent Labs Lab 10/31/12 1055 11/01/12 0620  AST 27 41*  ALT 23 28  ALKPHOS 93 67  BILITOT 0.7 0.4  PROT 8.3 6.5  ALBUMIN 4.1 3.2*   No results found for this basename: LIPASE, AMYLASE,  in the last 168 hours No results found for this basename: AMMONIA,  in the last 168 hours CBC:  Recent Labs Lab 10/31/12 1105 10/31/12 1600 11/01/12 0620  WBC 12.8* 9.3 6.2  NEUTROABS 11.1*  --   --   HGB 16.7* 15.8* 14.0  HCT 46.9* 45.2 41.9  MCV 88.3 89.0 89.9  PLT 220 184 157   Cardiac Enzymes: No results found for this basename: CKTOTAL, CKMB, CKMBINDEX, TROPONINI,  in the last 168 hours BNP: BNP (last 3 results) No results found for this basename: PROBNP,  in the last 8760 hours CBG: No results found for this basename: GLUCAP,  in the last 168 hours     Signed:  Kaylene Dawn A  [facility] 11/01/2012, 12:05 PM

## 2012-11-01 NOTE — Progress Notes (Signed)
NURSING PROGRESS NOTE  [name] [id number] Discharge Data: 11/01/2012 2:32 PM Attending Provider: Clydia Llano, MD WUJ:[id number] Artist Pais, DO     [name] [name] to be D/C'd Home per MD order.  Discussed with the patient the After Visit Summary and all questions fully answered. All IV's discontinued with no bleeding noted. All belongings returned to patient for patient to take home.   Last Vital Signs:  Blood pressure 96/64, pulse 74, temperature 97.9 F (36.6 C), temperature source Oral, resp. rate 16, height 5\' 5"  (1.651 m), weight 91.8 kg (202 lb 6.1 oz), SpO2 96.00%.  Discharge Medication List   Medication List    TAKE these medications       albuterol 108 (90 BASE) MCG/ACT inhaler  Commonly known as:  PROVENTIL HFA;VENTOLIN HFA  Inhale 2 puffs into the lungs every 6 (six) hours as needed for wheezing.     citalopram 20 MG tablet  Commonly known as:  CELEXA  Take 20 mg by mouth daily.     esomeprazole 40 MG capsule  Commonly known as:  NEXIUM  Take 40 mg by mouth 2 (two) times daily.     estradiol 0.075 MG/24HR  Commonly known as:  VIVELLE-DOT  Place 1 patch onto the skin 2 (two) times a week.     mometasone 50 MCG/ACT nasal spray  Commonly known as:  NASONEX  Place 2 sprays into the nose daily.       Madelin Rear RN, MSN, [employer]

## 2012-11-02 ENCOUNTER — Telehealth: Payer: Self-pay | Admitting: Internal Medicine

## 2012-11-02 NOTE — Telephone Encounter (Signed)
[facility] Triage Call Report Triage Record Num: [id number] Operator: Meribeth Mattes Patient Name: [name] [name] Call Date & Time: 10/31/2012 8:32:05AM Patient Phone: [phone] PCP: [name] [name] Patient Gender: Female PCP Fax : [phone] Patient DOB: Nov 11, 1955 Practice Name: [facility] Reason for Call: Caller: [name] [name]/Patient; PCP: [name] [name]) (Adults only); CB#: [phone]; Call regarding Vomiting and Diarrhea, onset 10/30/12, several bouts of each, last vomited 2200, but has diarrhea all night, voided this am, taking fluids ok, feels weak and off balance, did fall during the night when lost balance Triage per Diarrhea Protocol. See in ER disposition due to signs of dehydration. RN advised pt to be seen at [facility]. Husband to drive. Protocol(s) Used: Diarrhea or Other Change in Bowel Habits Recommended Outcome per Protocol: See ED Immediately Reason for Outcome: Signs of dehydration Care Advice: Change position slowly. Sit for a couple of minutes before standing to walk. Quick position changes may cause or worsen symptoms. ~ 04/

## 2012-11-05 NOTE — H&P (Signed)
[facility] History and Physical  [name] [name] MRN:[id number] DOB: 07/08/1956 DOA: 10/31/2012  Referring physician: [place] PCP: Robert Yoo, DO  Specialists: none  Chief Complaint: n/v and watery diarrhea  HPI: [name] [name] is a 57 y.o. female  Who staretd to experience cramping in her abdomen yesterday afternoon.  She developed some n/v subsequently starting about 18:00 and had about 5 episodes of vomit, non-feculent. No-bilious no blood.  SHe took some nexium and some pepto-bismol but these didn;t seem to help Around 10pm she staretd to have diarrea and it was no-stop all night long-no blood-seemed dark watery and seeemd very liquid-she and herhusband shared a [employer] pizza-several people ate this.   NO recent antibiotic exposure.-she does however take Nexium 40 bid-Patient's nephew had been sick the night before  Review of Systems: The patient denies chest pain shortness of breath blurred vision double vision urine incontinence vaginal discharge upper GI bleeding or lower GI bleeding falls weakness although shots will orthostatic earlier today  Past Medical History  Diagnosis Date  . GERD (gastroesophageal reflux disease)   . MVP (mitral valve prolapse)     history of   . Hyperlipidemia   . Abnormal glucose   . Tobacco abuse     tried Chantix- caused agression  . Osteoporosis   . Anxiety   . Esophageal stricture     Dr.Dora Brodie  . History of colonic polyps    Past Surgical History  Procedure Laterality Date  . Breast biopsy  2000  . Appendectomy  2008   Social History:  reports that she has been smoking.  She does not have any smokeless tobacco history on file. She reports that she does not drink alcohol. Her drug history is not on file. Patient lives at home with her husband. 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 Penicillins     REACTION: whelps, ,skin hot to touch    Family History  Problem Relation Age of  Onset  . Hyperlipidemia    . Hypertension    . Alcohol abuse Father   . Cirrhosis Father     deceased form cirrhosis  . Other      Fh of abnormal glucose     Prior to Admission medications   Medication Sig Start Date End Date Taking? Authorizing Provider  albuterol (PROVENTIL HFA;VENTOLIN HFA) 108 (90 BASE) MCG/ACT inhaler Inhale 2 puffs into the lungs every 6 (six) hours as needed for wheezing.   Yes Historical Provider, MD  citalopram (CELEXA) 20 MG tablet Take 20 mg by mouth daily.   Yes Historical Provider, MD  esomeprazole (NEXIUM) 40 MG capsule Take 40 mg by mouth 2 (two) times daily.   Yes Historical Provider, MD  estradiol (VIVELLE-DOT) 0.075 MG/24HR Place 1 patch onto the skin 2 (two) times a week.   Yes Historical Provider, MD  mometasone (NASONEX) 50 MCG/ACT nasal spray Place 2 sprays into the nose daily.   Yes Historical Provider, MD   Physical Exam: Filed Vitals:   10/31/12 0935 10/31/12 1331  BP: 118/73 117/57  Pulse: 96 85  Temp: 98.3 F (36.8 C) 98.8 F (37.1 C)  TempSrc: Oral Oral  Resp: 22 18  SpO2: 93% 93%     General:  Alert pleasant oriented  Eyes: No pallor no icterus  ENT: Clear  Neck: Soft supple  Cardiovascular: S1-S2 no murmur patella  Respiratory: Clinically clear  Abdomen: Soft nontender no rebound no guarding    Skin: No rash  Musculoskeletal: Range of motion grossly intact  Psychiatric: Euthymic  Neurologic: Grossly intact  Labs on Admission:  Basic Metabolic Panel:  Recent Labs Lab 10/31/12 1055  NA 133*  K 4.1  CL 94*  CO2 20  GLUCOSE 110*  BUN 25*  CREATININE 0.91  CALCIUM 9.5   Liver Function Tests:  Recent Labs Lab 10/31/12 1055  AST 27  ALT 23  ALKPHOS 93  BILITOT 0.7  PROT 8.3  ALBUMIN 4.1   No results found for this basename: LIPASE, AMYLASE,  in the last 168 hours No results found for this basename: AMMONIA,  in the last 168 hours CBC:  Recent Labs Lab 10/31/12 1105  WBC 12.8*  NEUTROABS  11.1*  HGB 16.7*  HCT 46.9*  MCV 88.3  PLT 220   Cardiac Enzymes: No results found for this basename: CKTOTAL, CKMB, CKMBINDEX, TROPONINI,  in the last 168 hours  BNP (last 3 results) No results found for this basename: PROBNP,  in the last 8760 hours CBG: No results found for this basename: GLUCAP,  in the last 168 hours  Radiological Exams on Admission: No results found.  EKG: Independently reviewed. none  Assessment/Plan Active Problems:   * No active hospital problems. *   1. Likely viral gastroenteritis-symptoms seem to be already resolving with her having no further vomiting or nausea. Given she has mild volume depletion and still does not feel very well I think it is reasonable to admit her overnight. She'll continue on IV fluids at 125 cc an hour and we will place on a clear liquid diet with goals to advanced to a solid diet by morning-if her stool persistently is present, we'll get a C. difficile panel, viral GI pathogen panel as well 2. Reflux-I've ordered pantoprazole for her. She realizes that if she continues to have diarrhea this may need to be discontinued if C. difficile becomes apparent 3. Depression continue citalopram 20 mg daily 4. Seasonal allergies- continue nasal spray  No consult called Full code Discussed with husband at bedside Anticipate one to 2 days obs status med-surg  Time spent: 50  Tishina Lown, JAI-GURMUKH [facility] Pager [phone]  If 7PM-7AM, please contact night-coverage [email or web address] Password [email or web address] 10/31/2012, 1:43 PM       

## 2012-11-11 ENCOUNTER — Encounter: Payer: Self-pay | Admitting: Internal Medicine

## 2012-11-11 ENCOUNTER — Ambulatory Visit (INDEPENDENT_AMBULATORY_CARE_PROVIDER_SITE_OTHER): Payer: BC Managed Care – PPO | Admitting: Internal Medicine

## 2012-11-11 VITALS — BP 124/74 | HR 72 | Temp 98.1°F | Wt 202.0 lb

## 2012-11-11 DIAGNOSIS — K529 Noninfective gastroenteritis and colitis, unspecified: Secondary | ICD-10-CM

## 2012-11-11 DIAGNOSIS — K219 Gastro-esophageal reflux disease without esophagitis: Secondary | ICD-10-CM

## 2012-11-11 DIAGNOSIS — F17200 Nicotine dependence, unspecified, uncomplicated: Secondary | ICD-10-CM

## 2012-11-11 DIAGNOSIS — R0683 Snoring: Secondary | ICD-10-CM | POA: Insufficient documentation

## 2012-11-11 DIAGNOSIS — R112 Nausea with vomiting, unspecified: Secondary | ICD-10-CM

## 2012-11-11 DIAGNOSIS — K5289 Other specified noninfective gastroenteritis and colitis: Secondary | ICD-10-CM

## 2012-11-11 DIAGNOSIS — R0609 Other forms of dyspnea: Secondary | ICD-10-CM

## 2012-11-11 LAB — BASIC METABOLIC PANEL
BUN: 13 mg/dL (ref 6–23)
CO2: 29 mEq/L (ref 19–32)
Calcium: 9.5 mg/dL (ref 8.4–10.5)
Creatinine, Ser: 0.7 mg/dL (ref 0.4–1.2)
Glucose, Bld: 100 mg/dL — ABNORMAL HIGH (ref 70–99)

## 2012-11-11 LAB — HEPATIC FUNCTION PANEL
Albumin: 4.1 g/dL (ref 3.5–5.2)
Total Protein: 7.6 g/dL (ref 6.0–8.3)

## 2012-11-11 NOTE — Assessment & Plan Note (Signed)
During her recent hospitalization, nursing staff noted nocturnal hypoxia. Arrange in-home sleep study.

## 2012-11-11 NOTE — Assessment & Plan Note (Signed)
Patient understands tobacco use likely exacerbating her reflux symptoms. Smoking cessation encouraged.

## 2012-11-11 NOTE — Assessment & Plan Note (Signed)
We discussed risks and benefits of chronic long-term PPI therapy. Patient advised to try taking ranitidine 150 in the morning and Nexium at bedtime.

## 2012-11-11 NOTE — Assessment & Plan Note (Signed)
Her symptoms presumed secondary to viral gastroenteritis. She had associated hypokalemia and mild elevation of her liver enzymes. Repeat electrolytes, kidney function and LFTs today. Patient advised to start over-the-counter probiotic supplement as well as psyllium fiber supplement.

## 2012-11-11 NOTE — Progress Notes (Signed)
Subjective:    Patient ID: [name] [name], female    DOB: 02/25/1956, 57 y.o.   MRN: [id number]  HPI  57 year old white female for hospital followup. Patient admitted on 10/31/2012 secondary to presumed viral gastroenteritis. Patient had projectile vomiting and diarrhea. Her symptoms presumed secondary to viral gastroenteritis. She was given IV fluids. Patient's hypokalemia was presumed secondary to GI losses. She was repleted with oral supplementation. She also had mild elevation in liver enzymes.  Patient reports her bowel movements are now completely back to normal but she is not having recurrent diarrhea. She denies any nausea or vomiting.  During hospitalization, nursing staff reported nocturnal hypoxia. They recommended outpatient testing for sleep apnea.  Review of Systems Negative for chest pain.  She continues nexium 40 mg bid for refractory GERD symptoms     Past Medical History  Diagnosis Date  . GERD (gastroesophageal reflux disease)   . MVP (mitral valve prolapse)     history of   . Hyperlipidemia   . Abnormal glucose   . Tobacco abuse     tried Chantix- caused agression  . Osteoporosis   . Anxiety   . Esophageal stricture     Dr.Dora Juanda Chance  . History of colonic polyps     History   Social History  . Marital Status: Married    Spouse Name: N/A    Number of Children: N/A  . Years of Education: N/A   Occupational History  . Not on file.   Social History Main Topics  . Smoking status: Smoker, Current Status Unknown -- 1.50 packs/day for 30 years    Start date: 07/15/1978  . Smokeless tobacco: Not on file     Comment: 1 PPD for 30 years  . Alcohol Use: No  . Drug Use: No  . Sexually Active: Not Currently   Other Topics Concern  . 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 Breast biopsy  2000  . Appendectomy  2008    Family History  Problem Relation Age of Onset  . Hyperlipidemia    . Hypertension    . Alcohol abuse Father   . Cirrhosis Father     deceased form cirrhosis  . Other      Fh of abnormal glucose    Allergies  Allergen Reactions  . Penicillins     REACTION: whelps, ,skin hot to touch    Current Outpatient Prescriptions on File Prior to Visit  Medication Sig Dispense Refill  . albuterol (PROVENTIL HFA;VENTOLIN HFA) 108 (90 BASE) MCG/ACT inhaler Inhale 2 puffs into the lungs every 6 (six) hours as needed for wheezing.      . citalopram (CELEXA) 20 MG tablet Take 20 mg by mouth daily.      Marland Kitchen esomeprazole (NEXIUM) 40 MG capsule Take 40 mg by mouth 2 (two) times daily.      Marland Kitchen estradiol (VIVELLE-DOT) 0.075 MG/24HR Place 1 patch onto the skin 2 (two) times a week.      . mometasone (NASONEX) 50 MCG/ACT nasal spray Place 2 sprays into the nose daily.       No current facility-administered  medications on file prior to visit.    BP 124/74  Pulse 72  Temp(Src) 98.1 F (36.7 C) (Oral)  Wt 202 lb (91.627 kg)  BMI 33.61 kg/m2    Objective:   Physical Exam  Constitutional: She is oriented to person, place, and time. She appears well-developed and well-nourished.  HENT:  Head: Normocephalic and atraumatic.  Cardiovascular: Normal rate, regular rhythm and normal heart sounds.   Pulmonary/Chest: Effort normal and breath sounds normal. She has no wheezes.  Abdominal: Soft. Bowel sounds are normal. She exhibits no mass. There is no tenderness.  Neurological: She is alert and oriented to person, place, and time.          Assessment & Plan:

## 2012-11-11 NOTE — Patient Instructions (Addendum)
Take probiotic supplement daily as directed for 1-2 months Take Psyllium fiber supplement daily Take OTC ranitidine 150 mg in the AM or PM instead of Nexium

## 2012-11-28 DIAGNOSIS — R0609 Other forms of dyspnea: Secondary | ICD-10-CM

## 2012-11-28 DIAGNOSIS — R0989 Other specified symptoms and signs involving the circulatory and respiratory systems: Secondary | ICD-10-CM

## 2012-11-30 ENCOUNTER — Ambulatory Visit (INDEPENDENT_AMBULATORY_CARE_PROVIDER_SITE_OTHER): Payer: BC Managed Care – PPO | Admitting: Pulmonary Disease

## 2012-11-30 DIAGNOSIS — R0683 Snoring: Secondary | ICD-10-CM

## 2012-12-08 ENCOUNTER — Telehealth: Payer: Self-pay | Admitting: Internal Medicine

## 2012-12-08 DIAGNOSIS — R0902 Hypoxemia: Secondary | ICD-10-CM

## 2012-12-08 NOTE — Telephone Encounter (Signed)
Pt would like a call w/ results of sleep apnea test results.

## 2012-12-09 NOTE — Telephone Encounter (Signed)
Call pt - home sleep study shows sleep related hypoxia - not OSA. I suggest consultation with Dr. Vassie Loll to see if she is candidate for oxygen therapy. RY  Referral order placed, pt aware

## 2013-01-18 ENCOUNTER — Ambulatory Visit (INDEPENDENT_AMBULATORY_CARE_PROVIDER_SITE_OTHER): Payer: BC Managed Care – PPO | Admitting: Pulmonary Disease

## 2013-01-18 ENCOUNTER — Encounter: Payer: Self-pay | Admitting: Pulmonary Disease

## 2013-01-18 VITALS — BP 112/62 | HR 65 | Temp 98.3°F | Ht 65.0 in | Wt 201.4 lb

## 2013-01-18 DIAGNOSIS — F17200 Nicotine dependence, unspecified, uncomplicated: Secondary | ICD-10-CM

## 2013-01-18 DIAGNOSIS — G4733 Obstructive sleep apnea (adult) (pediatric): Secondary | ICD-10-CM | POA: Insufficient documentation

## 2013-01-18 DIAGNOSIS — R0602 Shortness of breath: Secondary | ICD-10-CM

## 2013-01-18 NOTE — Assessment & Plan Note (Signed)
Surprisingly lung function is relatively preserved. However I used spirometry results to motivate her to quit smoking Smoking cessation remains the most important intervention here She will call us if she needs  Aids to help her quit

## 2013-01-18 NOTE — Patient Instructions (Addendum)
You have to quit smoking Breathing test  - lung capacity at 80% Sleep study in the lab

## 2013-01-18 NOTE — Progress Notes (Signed)
Subjective:    Patient ID: [name] [name], female    DOB: 1956/02/18, 57 y.o.   MRN: [id number]  HPI 57 year old heavy smoker presents for evaluation of nocturnal hypoxia. She smokes a pack and a half a day starting from a teenager, but 35 pack years She was admitted on 10/31/2012 secondary to presumed viral gastroenteritis. During hospitalization, nursing staff reported nocturnal hypoxia. They recommended outpatient testing for sleep apnea. Home sleep study showed desaturations in the absence of apneas or hypopneas suggestive underlying pulmonary disease. She spent about an hour with a saturations less than 89%. Surprisingly spirometry today showed FEV1 of 81% without significant airway obstruction. Also note that the spirometry in the past has shown an FEV1 of 75%.  Epworth sleepiness score is 7. She reports loud snoring. Bedtime is around midnight, sleep latency is minimal but she describes some degree of sleep onset insomnia. She has several awakenings for no clear reason she sleeps on her side with one pillow and is out of bed at 7:30 AM feeling tired without dryness of mouth or headaches. She has gained an unquantified amount of weight in the last 2 years. She reports a strong family history of obstructive sleep apnea in her brother sister and mother. There is no history suggestive of cataplexy, sleep paralysis or parasomnias   She was given albuterol MDI from an urgent care and does report that this relieves congestion. She underwent polyp removal from her larynx by Dr. Lazarus Salines and she presented with hoarseness and dyspnea. Reports severe GERD requiring Nexium. Chest x-ray in 2010 does not show any evidence of emphysema or infiltrates  Past Medical History  Diagnosis Date  . GERD (gastroesophageal reflux disease)   . MVP (mitral valve prolapse)     history of   . Hyperlipidemia   . Abnormal glucose   . Tobacco abuse     tried Chantix- caused agression  . Osteoporosis   . Anxiety    . Esophageal stricture     Dr.Dora Juanda Chance  . History of colonic polyps     Past Surgical History  Procedure Laterality Date  . Breast biopsy  2000  . Appendectomy  2008    Allergies  Allergen Reactions  . Penicillins     REACTION: whelps, ,skin hot to touch   History   Social History  . Marital Status: Married    Spouse Name: N/A    Number of Children: N/A  . Years of Education: N/A   Occupational History  . administrative assitant     Social History Main Topics  . Smoking status: Smoker, Current Status Unknown -- 1.50 packs/day for 30 years    Start date: 07/15/1978  . Smokeless tobacco: Not on file  . Alcohol Use: No  . Drug Use: No  . Sexually Active: Not Currently   Other Topics Concern  . 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 Hyperlipidemia    . Hypertension Mother   . Alcohol abuse Father   . Cirrhosis Father     deceased form cirrhosis  . Other      Fh of abnormal glucose  .  Hypertension Father   . Hypertension Sister   . Hypertension Brother   . Cancer Mother     apendix     Review of Systems  Constitutional: Negative for appetite change and unexpected weight change.  HENT: Positive for congestion. Negative for ear pain, sore throat, sneezing, trouble swallowing and dental problem.   Respiratory: Positive for cough and shortness of breath.   Cardiovascular: Negative for chest pain, palpitations and leg swelling.  Gastrointestinal: Negative for abdominal pain.  Musculoskeletal: Negative for joint swelling.  Skin: Negative for rash.  Neurological: Negative for headaches.  Psychiatric/Behavioral: Negative for dysphoric mood. The patient is not nervous/anxious.         Objective:   Physical Exam  Gen. Pleasant, well-nourished, in no distress, normal affect ENT - no lesions, no post nasal drip, class 2 airway Neck: No JVD, no thyromegaly, no carotid bruits Lungs: no use of accessory muscles, no dullness to percussion, clear without rales or rhonchi  Cardiovascular: Rhythm regular, heart sounds  normal, no murmurs or gallops, no peripheral edema Abdomen: soft and non-tender, no hepatosplenomegaly, BS normal. Musculoskeletal: No deformities, no cyanosis or clubbing Neuro:  alert, non focal       Assessment & Plan:

## 2013-01-18 NOTE — Assessment & Plan Note (Addendum)
Given excessive daytime somnolence, narrow pharyngeal exam, witnessed apneas & loud snoring, obstructive sleep apnea is very likely & an overnight polysomnogram will be scheduled as a split study. Although home study neg, nocturnla desatn  & symptoms is concerning for false negative  The pathophysiology of obstructive sleep apnea , it's cardiovascular consequences & modes of treatment including CPAP were discused with the patient in detail & they evidenced understanding. Trial of melatonin 5 mg 2 hours prior to bedtime for sleep onset insomnia

## 2013-01-26 ENCOUNTER — Other Ambulatory Visit: Payer: Self-pay | Admitting: Internal Medicine

## 2013-02-14 ENCOUNTER — Ambulatory Visit (HOSPITAL_BASED_OUTPATIENT_CLINIC_OR_DEPARTMENT_OTHER): Payer: BC Managed Care – PPO | Attending: Pulmonary Disease | Admitting: Radiology

## 2013-02-14 VITALS — Ht 65.0 in | Wt 205.0 lb

## 2013-02-14 DIAGNOSIS — G4734 Idiopathic sleep related nonobstructive alveolar hypoventilation: Secondary | ICD-10-CM | POA: Insufficient documentation

## 2013-02-14 DIAGNOSIS — G4733 Obstructive sleep apnea (adult) (pediatric): Secondary | ICD-10-CM

## 2013-02-16 ENCOUNTER — Telehealth: Payer: Self-pay | Admitting: Pulmonary Disease

## 2013-02-16 DIAGNOSIS — G478 Other sleep disorders: Secondary | ICD-10-CM

## 2013-02-16 NOTE — Telephone Encounter (Signed)
PSG did not show significant OSA - does not need CPAP Showed mild drop in O2 level due to COPD

## 2013-02-17 NOTE — Telephone Encounter (Signed)
LMTCB x1 for pt.  

## 2013-02-17 NOTE — Procedures (Signed)
[name] [name], [name] [name]                  ACCOUNT NO.:  [id number]  MEDICAL RECORD NO.:  [id number]          PATIENT TYPE:  OUT  LOCATION:  SLEEP CENTER                 FACILITY:  [facility]  PHYSICIAN:  [name] Milch, MD      DATE OF BIRTH:  1956/04/27  DATE OF STUDY:  02/16/2013                           NOCTURNAL POLYSOMNOGRAM  REFERRING PHYSICIAN:  Oretha Milch, MD  INDICATION FOR STUDY:  Ms. Civil is a 57 year old woman who was with nocturnal hypoxia noted during a recent hospitalization for viral gastroenteritis, although a home sleep study was negative for nocturnal desaturation and her symptoms were concerning for a false negative study and hence an polysomnogram was performed.  At the time of this study, she weighed 205 pounds with a height of 5 feet 5 inches, BMI of 34, neck size of 14 inches, Epworth sleepiness score of 7.  This nocturnal polysomnogram was performed with a sleep technologist in attendance.  EEG, EOG, EMG, EKG, and respiratory parameters were recorded.  Sleep stages, arousals, limb movements, and respiratory data were scored according to criteria laid out by the American academy of Sleep Medicine.  EPWORTH SLEEPINESS SCORE:  7.  SLEEP ARCHITECTURE:  Lights out was at 10:32 p.m., lights on was at 4:44 a.m.  Total sleep time was 262 minutes with a sleep period time of 336 minutes and a sleep efficiency of 71%.  Sleep latency was 25 minutes with a latency to REM sleep of 201 minutes and awake after sleep onset of 84 minutes. Sleep stages of the percentage of total sleep time was N1 15%, N2 56%, N3 8%, and REM sleep 22% (57 minutes).  Supine REM sleep accounted for 31 minutes.  Longest period of REM sleep was around 3:30 a.m.  RESPIRATORY DATA:  There were total of 7 obstructive apneas, 0 central apneas, 0 mixed apneas with 7 hypopneas with an apnea-hypopnea index of 3.2 events per hour, 26 RERAs were noted with an RDI of 9 events per hour.  LIMB MOVEMENT DATA:   The PLM index was 29 events per hour.  However, the PLM arousal index was only 5 events per hour.  OXYGEN SATURATION DATA:  The desaturation index was 6 events per hour. The lowest desaturation was 86% during non-REM sleep. She spent only 0.9 minutes with a saturation of less than 98%.  CARDIAC DATA:  The low heart rate was 51 beats per minute.  The high heart rate was 89 beats per minute.  No arrhythmias were noted.  DISCUSSION:  She did not meet the criteria for CPAP intervention.   IMPRESSIONS: 1. 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 2. Oxygen desaturation without events is suggestive of underlying     pulmonary disease such as COPD. 3. Few PLMs with arousals were noted.  The significance of this is     unknown.  Please correlate with a clinical history of restless legs     syndrome. 4. No evidence of cardiac arrhythmias, or behavioral disturbance     during sleep.  RECOMMENDATION: 1. Treatment option for this degree of sleep-disordered breathing  include weight loss.  CPAP therapy is probably not indicated.     Oxygen, she does not meet criteria for oxygen therapy.  Smoking     cessation should be emphasized. 2. She should be asked to avoid medications with sedative side     effects.  She should be cautioned against driving when sleepy.     [name] Milch, MD    RVA/MEDQ  D:  02/16/2013 12:45:08  T:  02/17/2013 02:11:01  Job:  [id number]

## 2013-02-18 ENCOUNTER — Telehealth: Payer: Self-pay | Admitting: Pulmonary Disease

## 2013-02-18 NOTE — Telephone Encounter (Signed)
Duplicate message see phone note 02/16/13. Will sign off this one

## 2013-02-19 NOTE — Telephone Encounter (Signed)
LMTCB x 1 

## 2013-02-19 NOTE — Telephone Encounter (Signed)
Pt returned call & asked to be reached at [phone].  [name] [name]

## 2013-02-19 NOTE — Telephone Encounter (Signed)
Spoke with patient Advised of results below per Dr. Vassie Loll Patient verbalized understanding and nothing further needed at this time

## 2013-02-22 ENCOUNTER — Encounter: Payer: BC Managed Care – PPO | Admitting: Family Medicine

## 2013-02-22 NOTE — Progress Notes (Addendum)
Chief Complaint  Patient presents with  . Error    HPI:  ROS: See pertinent positives and negatives per HPI.  Past Medical History  Diagnosis Date  . GERD (gastroesophageal reflux disease)   . MVP (mitral valve prolapse)     history of   . Hyperlipidemia   . Abnormal glucose   . Tobacco abuse     tried Chantix- caused agression  . Osteoporosis   . Anxiety   . Esophageal stricture     Dr.Dora Juanda Chance  . History of colonic polyps     Family History  Problem Relation Age of Onset  . Hyperlipidemia    . Hypertension Mother   . Alcohol abuse Father   . Cirrhosis Father     deceased form cirrhosis  . Other      Fh of abnormal glucose  . Hypertension Father   . Hypertension Sister   . Hypertension Brother   . Cancer Mother     apendix    History   Social History  . Marital Status: Married    Spouse Name: N/A    Number of Children: N/A  . Years of Education: N/A   Occupational History  . administrative assitant     Social History Main Topics  . Smoking status: Smoker, Current Status Unknown -- 1.50 packs/day for 30 years    Start date: 07/15/1978  . Smokeless tobacco: Not on file  . Alcohol Use: No  . Drug Use: No  . Sexually Active: Not Currently   Other Topics Concern  . 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[employer])   Married 30 years     Sons 22, 28   new grandaughter - Gaffer     Current Smoker ( 1ppd 30 yrs)    Alcohol use-no     Caffeine use/day:  None    Current outpatient prescriptions:albuterol (PROVENTIL HFA;VENTOLIN HFA) 108 (90 BASE) MCG/ACT inhaler, Inhale 2 puffs into the lungs every 6 (six) hours as needed for wheezing., Disp: , Rfl: ;  citalopram (CELEXA) 20 MG tablet, TAKE 1 TABLET DAILY, Disp: 90 tablet, Rfl: 0;  esomeprazole (NEXIUM) 40 MG capsule, Take 40 mg by mouth 2 (two) times daily., Disp: , Rfl:  estradiol (VIVELLE-DOT) 0.075 MG/24HR, Place 1 patch onto  the skin 2 (two) times a week., Disp: , Rfl:   EXAM:  There were no vitals filed for this visit.  There is no weight on file to calculate BMI.  GENERAL: vitals reviewed and listed above, alert, oriented, appears well hydrated and in no acute distress  HEENT: atraumatic, conjunttiva clear, no obvious abnormalities on inspection of external nose and ears  NECK: no obvious masses on inspection  LUNGS: clear to auscultation bilaterally, no wheezes, rales or rhonchi, good air movement  CV: HRRR, no peripheral edema  MS: moves all extremities without noticeable abnormality  PSYCH: pleasant and cooperative, no obvious depression or anxiety  ASSESSMENT AND PLAN:  Discussed the following assessment and plan:  Erroneous encounter - disregard  -Patient advised to return or notify a doctor immediately if symptoms worsen or persist or new concerns arise.  There are no Patient Instructions on file for this visit.   Kriste Basque R.

## 2013-02-22 NOTE — Addendum Note (Signed)
Addended by: Terressa Koyanagi on: 02/22/2013 12:38 PM   Modules accepted: Level of Service

## 2013-02-26 ENCOUNTER — Other Ambulatory Visit: Payer: Self-pay | Admitting: *Deleted

## 2013-02-26 MED ORDER — ALBUTEROL SULFATE HFA 108 (90 BASE) MCG/ACT IN AERS: 2.0000 | INHALATION_SPRAY | Freq: Four times a day (QID) | RESPIRATORY_TRACT | Status: DC | PRN

## 2013-03-01 ENCOUNTER — Encounter: Payer: Self-pay | Admitting: Internal Medicine

## 2013-03-01 ENCOUNTER — Other Ambulatory Visit: Payer: Self-pay | Admitting: *Deleted

## 2013-03-01 ENCOUNTER — Ambulatory Visit (INDEPENDENT_AMBULATORY_CARE_PROVIDER_SITE_OTHER): Payer: BC Managed Care – PPO | Admitting: Internal Medicine

## 2013-03-01 VITALS — BP 142/84 | HR 83 | Temp 98.5°F | Wt 202.0 lb

## 2013-03-01 DIAGNOSIS — J209 Acute bronchitis, unspecified: Secondary | ICD-10-CM | POA: Insufficient documentation

## 2013-03-01 MED ORDER — ALBUTEROL SULFATE HFA 108 (90 BASE) MCG/ACT IN AERS: 2.0000 | INHALATION_SPRAY | Freq: Four times a day (QID) | RESPIRATORY_TRACT | Status: DC | PRN

## 2013-03-01 MED ORDER — MOMETASONE FURO-FORMOTEROL FUM 200-5 MCG/ACT IN AERO: 2.0000 | INHALATION_SPRAY | Freq: Two times a day (BID) | RESPIRATORY_TRACT | Status: DC

## 2013-03-01 MED ORDER — PREDNISONE 10 MG PO TABS: ORAL_TABLET | ORAL | Status: DC

## 2013-03-01 MED ORDER — DOXYCYCLINE HYCLATE 100 MG PO TABS: 100.0000 mg | ORAL_TABLET | Freq: Two times a day (BID) | ORAL | Status: DC

## 2013-03-01 NOTE — Patient Instructions (Addendum)
Please contact our office if your symptoms do not improve or gets worse.  

## 2013-03-01 NOTE — Progress Notes (Signed)
Subjective:    Patient ID: [name] [name], female    DOB: 1956/01/26, 57 y.o.   MRN: [id number]  HPI  57 year old white female with history of tobacco use, abnormal glucose and anxiety complains of cough for last 2 weeks. She was seen in urgent care little over a week ago and diagnosed with possible sinus infection. She was treated with Z-Pak. Patient reports her cough is progressively worsening. She denies any fever or chills. She has some wheezing.   Review of Systems Negative for shortness of breath  She is still smoker but is not ready to quit  Past Medical History  Diagnosis Date  . GERD (gastroesophageal reflux disease)   . MVP (mitral valve prolapse)     history of   . Hyperlipidemia   . Abnormal glucose   . Tobacco abuse     tried Chantix- caused agression  . Osteoporosis   . Anxiety   . Esophageal stricture     Dr.Dora Juanda Chance  . History of colonic polyps     History   Social History  . Marital Status: Married    Spouse Name: N/A    Number of Children: N/A  . Years of Education: N/A   Occupational History  . administrative assitant     Social History Main Topics  . Smoking status: Smoker, Current Status Unknown -- 1.50 packs/day for 30 years    Start date: 07/15/1978  . Smokeless tobacco: Not on file  . Alcohol Use: No  . Drug Use: No  . Sexual Activity: Not Currently   Other Topics Concern  . 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 Breast biopsy  2000  . Appendectomy  2008    Family History  Problem Relation Age of Onset  . Hyperlipidemia    . Hypertension Mother   . Alcohol abuse Father   . Cirrhosis Father     deceased form cirrhosis  . Other      Fh of  abnormal glucose  . Hypertension Father   . Hypertension Sister   . Hypertension Brother   . Cancer Mother     apendix    Allergies  Allergen Reactions  . Penicillins     REACTION: whelps, ,skin hot to touch    Current Outpatient Prescriptions on File Prior to Visit  Medication Sig Dispense Refill  . citalopram (CELEXA) 20 MG tablet TAKE 1 TABLET DAILY  90 tablet  0  . esomeprazole (NEXIUM) 40 MG capsule Take 40 mg by mouth 2 (two) times daily.      Marland Kitchen estradiol (VIVELLE-DOT) 0.075 MG/24HR Place 1 patch onto the skin 2 (two) times a week.       No current facility-administered medications on file prior to visit.    BP 142/84  Pulse 83  Temp(Src) 98.5 F (36.9 C) (Oral)  Wt 202 lb (91.627 kg)  BMI 33.61 kg/m2  SpO2 93%       Objective:   Physical Exam  Constitutional: She is oriented to person, place, and time. She appears well-developed and well-nourished.  HENT:  Head: Normocephalic and atraumatic.  Right Ear: External  ear normal.  Left Ear: External ear normal.  Mouth/Throat: No oropharyngeal exudate.  Oropharyngeal erythema  Neck: Neck supple.  Cardiovascular: Normal rate, regular rhythm and normal heart sounds.   Pulmonary/Chest: Effort normal. She has no rales.  Diffuse expiratory wheeze  Lymphadenopathy:    She has no cervical adenopathy.  Neurological: She is alert and oriented to person, place, and time. No cranial nerve deficit.  Psychiatric: She has a normal mood and affect. Her behavior is normal.          Assessment & Plan:

## 2013-03-01 NOTE — Assessment & Plan Note (Signed)
57 year old white female smoker has asthmatic bronchitis. Treat with doxycycline 100 mg twice daily for 10 days. Also take prednisone taper. Use the [facility] inhaler as directed.  Patient advised to call office if symptoms persist or worsen.  Smoking cessation strongly encouraged, however she is not ready to quit.

## 2013-03-02 ENCOUNTER — Telehealth: Payer: Self-pay | Admitting: Internal Medicine

## 2013-03-02 NOTE — Telephone Encounter (Signed)
Patient Information:  Caller Name: [name] [name]  Phone: [phone]  Patient: [name] [name]  Gender: Female  DOB: 1955-07-25  Age: 57 Years  PCP: [name] [name]) (Adults only)  Pregnant: No  Office Follow Up:  Does the office need to follow up with this patient?: Yes  Instructions For The Office: Was seen in office on 03/01/13 due to "bronchial asthma. " Was ordered Dulera, Prednisone and Doxycycline. Asking if she can take a cough med at night to help her sleep? Coughing is worse at night.   Symptoms  Reason For Call & Symptoms: [name] [name] states she was see in office on 03/01/13 and was diagnosed with Bronchial Asthma. States cough is worse at night. C/o stomach and rib area pain secondary to coughing.  Was ordered a Dulera Inhaler, Prednisone and Doxycycline. Unable to sleep at night.  Asking if she can use a cough syrup, Dextromethorphan  at night? Per cough protocol has see today in office protocol due to severe coughing spells. Was seen in  office on 03/01/13.  Please call [name] [name] at [phone].  Reviewed Health History In EMR: Yes  Reviewed Medications In EMR: Yes  Reviewed Allergies In EMR: No  Reviewed Surgeries / Procedures: Yes  Date of Onset of Symptoms: 02/23/2013  Treatments Tried: Zpak  Treatments Tried Worked: No OB / GYN:  LMP: Unknown  Guideline(s) Used:  Cough  Disposition Per Guideline:   See Today in Office  Reason For Disposition Reached:   Severe coughing spells (e.g., whooping sound after coughing, vomiting after coughing)  Advice Given:  N/A  Patient Refused Recommendation:  Patient Refused Care Advice  Was seen in office on 03/01/13 . Calling to see if she may use a cough medication at night so she can sleep.

## 2013-03-03 MED ORDER — HYDROCODONE-HOMATROPINE 5-1.5 MG/5ML PO SYRP: 5.0000 mL | ORAL_SOLUTION | Freq: Two times a day (BID) | ORAL | Status: DC

## 2013-03-03 NOTE — Telephone Encounter (Signed)
rx called in, pt aware 

## 2013-03-03 NOTE — Telephone Encounter (Signed)
Yes, she can use dextromethorphan at night.   Also call in generic hycodan 5 ml q 12 hrs prn.  120 ml  No RF.  If worsening cough or sob, pt needs CXR pa/lat then OV.

## 2013-03-03 NOTE — Addendum Note (Signed)
Addended by: Alfred Levins D on: 03/03/2013 05:09 PM   Modules accepted: Orders

## 2013-03-09 ENCOUNTER — Ambulatory Visit (INDEPENDENT_AMBULATORY_CARE_PROVIDER_SITE_OTHER): Payer: BC Managed Care – PPO | Admitting: Internal Medicine

## 2013-03-09 ENCOUNTER — Encounter: Payer: Self-pay | Admitting: Internal Medicine

## 2013-03-09 VITALS — BP 112/64 | Temp 98.2°F | Wt 202.0 lb

## 2013-03-09 DIAGNOSIS — J209 Acute bronchitis, unspecified: Secondary | ICD-10-CM

## 2013-03-09 NOTE — Progress Notes (Signed)
Subjective:    Patient ID: [name] [name], female    DOB: 1956/06/17, 57 y.o.   MRN: [id number]  HPI  57 year old white female with history of tobacco use and recent bout of bronchitis for followup. Patient reports she has finished prednisone taper. She is taking her last several doses of doxycycline. She reports coughing and wheezing have improved but not completely resolved. Cough is nonproductive.  Review of Systems Negative for fever or shortness of breath    Past Medical History  Diagnosis Date  . GERD (gastroesophageal reflux disease)   . MVP (mitral valve prolapse)     history of   . Hyperlipidemia   . Abnormal glucose   . Tobacco abuse     tried Chantix- caused agression  . Osteoporosis   . Anxiety   . Esophageal stricture     Dr.Dora Juanda Chance  . History of colonic polyps     History   Social History  . Marital Status: Married    Spouse Name: N/A    Number of Children: N/A  . Years of Education: N/A   Occupational History  . administrative assitant     Social History Main Topics  . Smoking status: Smoker, Current Status Unknown -- 1.50 packs/day for 30 years    Start date: 07/15/1978  . Smokeless tobacco: Not on file  . Alcohol Use: No  . Drug Use: No  . Sexual Activity: Not Currently   Other Topics Concern  . 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 Breast biopsy  2000  . Appendectomy  2008    Family History  Problem Relation Age of Onset  . Hyperlipidemia    . Hypertension Mother   . Alcohol abuse Father   . Cirrhosis Father     deceased form cirrhosis  . Other      Fh of abnormal glucose  . Hypertension Father   . Hypertension Sister   . Hypertension Brother    . Cancer Mother     apendix    Allergies  Allergen Reactions  . Penicillins     REACTION: whelps, ,skin hot to touch    Current Outpatient Prescriptions on File Prior to Visit  Medication Sig Dispense Refill  . albuterol (PROVENTIL HFA;VENTOLIN HFA) 108 (90 BASE) MCG/ACT inhaler Inhale 2 puffs into the lungs every 6 (six) hours as needed for wheezing.  6.7 g  5  . citalopram (CELEXA) 20 MG tablet TAKE 1 TABLET DAILY  90 tablet  0  . doxycycline (VIBRA-TABS) 100 MG tablet Take 1 tablet (100 mg total) by mouth 2 (two) times daily.  20 tablet  0  . esomeprazole (NEXIUM) 40 MG capsule Take 40 mg by mouth 2 (two) times daily.      Marland Kitchen estradiol (VIVELLE-DOT) 0.075 MG/24HR Place 1 patch onto the skin 2 (two) times a week.      . mometasone-formoterol (DULERA) 200-5 MCG/ACT AERO Inhale 2 puffs into the lungs 2 (two) times daily.  8.8 g  1   No current facility-administered medications on file prior to visit.    BP 112/64  Temp(Src) 98.2 F (36.8  C) (Oral)  Wt 202 lb (91.627 kg)  BMI 33.61 kg/m2    Objective:   Physical Exam  Constitutional: She appears well-developed and well-nourished.  HENT:  Head: Normocephalic and atraumatic.  Mouth/Throat: Oropharynx is clear and moist.  Cardiovascular: Normal rate, regular rhythm and normal heart sounds.   No murmur heard. Pulmonary/Chest: Effort normal and breath sounds normal. She has no wheezes.  Psychiatric: She has a normal mood and affect. Her behavior is normal.          Assessment & Plan:

## 2013-03-09 NOTE — Patient Instructions (Addendum)
Your chest sounds better.  Gargle with warm salt water as needed. Please contact our office if your symptoms do not improve or gets worse.

## 2013-03-09 NOTE — Assessment & Plan Note (Signed)
57 year old white female with resolving bronchitis. Patient reassured. Complete full 10 day course of doxycycline.  Use Dulera as directed for 1-2 months.  I encouraged smoking cessation.

## 2013-04-28 ENCOUNTER — Other Ambulatory Visit: Payer: Self-pay | Admitting: *Deleted

## 2013-04-28 MED ORDER — ESOMEPRAZOLE MAGNESIUM 40 MG PO CPDR: 40.0000 mg | DELAYED_RELEASE_CAPSULE | Freq: Two times a day (BID) | ORAL | Status: DC

## 2013-04-29 ENCOUNTER — Encounter: Payer: Self-pay | Admitting: Internal Medicine

## 2013-04-29 ENCOUNTER — Ambulatory Visit (INDEPENDENT_AMBULATORY_CARE_PROVIDER_SITE_OTHER): Payer: BC Managed Care – PPO | Admitting: Internal Medicine

## 2013-04-29 VITALS — BP 122/64 | HR 81 | Temp 98.2°F | Ht 65.0 in | Wt 205.0 lb

## 2013-04-29 DIAGNOSIS — J069 Acute upper respiratory infection, unspecified: Secondary | ICD-10-CM

## 2013-04-29 DIAGNOSIS — R232 Flushing: Secondary | ICD-10-CM

## 2013-04-29 DIAGNOSIS — N951 Menopausal and female climacteric states: Secondary | ICD-10-CM

## 2013-04-29 DIAGNOSIS — F4323 Adjustment disorder with mixed anxiety and depressed mood: Secondary | ICD-10-CM

## 2013-04-29 MED ORDER — CITALOPRAM HYDROBROMIDE 20 MG PO TABS: 20.0000 mg | ORAL_TABLET | Freq: Every day | ORAL | Status: DC

## 2013-04-29 MED ORDER — ESOMEPRAZOLE MAGNESIUM 40 MG PO CPDR: 40.0000 mg | DELAYED_RELEASE_CAPSULE | Freq: Two times a day (BID) | ORAL | Status: DC

## 2013-04-29 MED ORDER — ESTRADIOL 0.075 MG/24HR TD PTTW: 1.0000 | MEDICATED_PATCH | TRANSDERMAL | Status: DC

## 2013-04-29 NOTE — Progress Notes (Signed)
Subjective:    Patient ID: [name] [name], female    DOB: 07-12-1956, 57 y.o.   MRN: [id number]  HPI  57 year old white female with history of tobacco use, chronic depression/anxiety and gastroesophageal reflux disease complains of coughing for 2 days. Her symptoms started out with upper respiratory symptoms. She reports minimal shortness of breath. She is feeling somewhat better today. She denies fever or chills.  Anxiety/depression-stable on citalopram.  She is usually followed by her gynecologist for postmenopausal symptoms. She has been using Vivelle-Dot patches. She ran out a month ago.  GERD - stable on Nexium  Review of Systems Negative for productive sputum, no wheezing.  She last use Dulera inhaler yesterday    Past Medical History  Diagnosis Date  . GERD (gastroesophageal reflux disease)   . MVP (mitral valve prolapse)     history of   . Hyperlipidemia   . Abnormal glucose   . Tobacco abuse     tried Chantix- caused agression  . Osteoporosis   . Anxiety   . Esophageal stricture     Dr.Dora Juanda Chance  . History of colonic polyps     History   Social History  . Marital Status: Married    Spouse Name: N/A    Number of Children: N/A  . Years of Education: N/A   Occupational History  . administrative assitant     Social History Main Topics  . Smoking status: Smoker, Current Status Unknown -- 1.50 packs/day for 30 years    Start date: 07/15/1978  . Smokeless tobacco: Not on file  . Alcohol Use: No  . Drug Use: No  . Sexual Activity: Not Currently   Other Topics Concern  . 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 Breast biopsy  2000  . Appendectomy  2008    Family History   Problem Relation Age of Onset  . Hyperlipidemia    . Hypertension Mother   . Alcohol abuse Father   . Cirrhosis Father     deceased form cirrhosis  . Other      Fh of abnormal glucose  . Hypertension Father   . Hypertension Sister   . Hypertension Brother   . Cancer Mother     apendix    Allergies  Allergen Reactions  . Penicillins     REACTION: whelps, ,skin hot to touch    Current Outpatient Prescriptions on File Prior to Visit  Medication Sig Dispense Refill  . albuterol (PROVENTIL HFA;VENTOLIN HFA) 108 (90 BASE) MCG/ACT inhaler Inhale 2 puffs into the lungs every 6 (six) hours as needed for wheezing.  6.7 g  5  . mometasone-formoterol (DULERA) 200-5 MCG/ACT AERO Inhale 2 puffs into the lungs 2 (two) times daily.  8.8 g  1   No current facility-administered medications on file prior to visit.    BP 122/64  Pulse 81  Temp(Src) 98.2 F (36.8 C) (Oral)  Ht 5\' 5"  (1.651 m)  Wt 205 lb (92.987 kg)  BMI 34.11 kg/m2  SpO2 97%    Objective:   Physical Exam  Constitutional: She is oriented to person, place, and time.  She appears well-developed and well-nourished. No distress.  HENT:  Head: Normocephalic and atraumatic.  Right Ear: External ear normal.  Left Ear: External ear normal.  Mild oropharyngeal erythema  Neck: Neck supple.  Cardiovascular: Normal rate, regular rhythm and normal heart sounds.   No murmur heard. Pulmonary/Chest: Effort normal and breath sounds normal. She has no wheezes.  Musculoskeletal: She exhibits no edema.  Lymphadenopathy:    She has no cervical adenopathy.  Neurological: She is alert and oriented to person, place, and time. No cranial nerve deficit.  Skin: Skin is warm and dry.  Psychiatric: She has a normal mood and affect. Her behavior is normal.          Assessment & Plan:

## 2013-04-29 NOTE — Assessment & Plan Note (Signed)
Stable.  Continue same dose of citalopram. 

## 2013-04-29 NOTE — Patient Instructions (Signed)
Gargle with warm salt water twice daily Use intranasal saline spray 4 to 5 times per day Get plenty of rest and fluids Please contact our office if your symptoms do not improve or gets worse.

## 2013-04-29 NOTE — Assessment & Plan Note (Signed)
Patient usually followed by her gynecologist for Vivelle-Dot patch.  Vivelle-Dot RF x 2 provided.  Patient advised to followup with her gynecologist.

## 2013-04-29 NOTE — Assessment & Plan Note (Signed)
57 year old white female with probable viral upper respiratory infection. I recommended symptomatic treatment.  Patient advised to call office if symptoms persist or worsen.

## 2013-05-20 ENCOUNTER — Other Ambulatory Visit: Payer: Self-pay

## 2013-06-03 ENCOUNTER — Ambulatory Visit (INDEPENDENT_AMBULATORY_CARE_PROVIDER_SITE_OTHER): Payer: BC Managed Care – PPO | Admitting: Family Medicine

## 2013-06-03 ENCOUNTER — Encounter: Payer: Self-pay | Admitting: Family Medicine

## 2013-06-03 VITALS — BP 120/78 | HR 77 | Temp 98.1°F | Wt 210.0 lb

## 2013-06-03 DIAGNOSIS — J988 Other specified respiratory disorders: Secondary | ICD-10-CM

## 2013-06-03 MED ORDER — DOXYCYCLINE HYCLATE 100 MG PO TABS: 100.0000 mg | ORAL_TABLET | Freq: Two times a day (BID) | ORAL | Status: DC

## 2013-06-03 NOTE — Patient Instructions (Signed)
Dulera take 2 puffs twice daily.

## 2013-06-03 NOTE — Progress Notes (Signed)
  Subjective:    Patient ID: [name] [name], female    DOB: [birth year], 57 y.o.   MRN: [id number]  HPI Acute visit Patient seen with productive cough over the past several days. She also has bilateral earache and sinus congestion. She smokes one pack cigarettes per day. Denies any fever or chills. History of frequent bronchitis episodes in the past. She had spirometry back last summer with surprisingly fairly well preserved lung function. She has taken doxycycline in the past. She has albuterol and Dulera but is not take either one regularly.  Past Medical History  Diagnosis Date  . GERD (gastroesophageal reflux disease)   . MVP (mitral valve prolapse)     history of   . Hyperlipidemia   . Abnormal glucose   . Tobacco abuse     tried Chantix- caused agression  . Osteoporosis   . Anxiety   . Esophageal stricture     Dr.Dora Juanda Chance  . History of colonic polyps    Past Surgical History  Procedure Laterality Date  . Breast biopsy  2000  . Appendectomy  2008    reports that she has been smoking.  She started smoking about 34 years ago. She does not have any smokeless tobacco history on file. She reports that she does not drink alcohol or use illicit drugs. family history includes Alcohol abuse in her father; Cancer in her mother; Cirrhosis in her father; Hyperlipidemia in an other family member; Hypertension in her brother, father, mother, and sister; Other in an other family member. Allergies  Allergen Reactions  . Penicillins     REACTION: whelps, ,skin hot to touch      Review of Systems  Constitutional: Negative for fever and chills.  HENT: Positive for congestion, rhinorrhea and sinus pressure.   Respiratory: Positive for cough.        Objective:   Physical Exam  Constitutional: She appears well-developed and well-nourished.  HENT:  Right Ear: External ear normal.  Left Ear: External ear normal.  Mouth/Throat: Oropharynx is clear and moist.  Neck: Neck supple.   Cardiovascular: Normal rate and regular rhythm.   Pulmonary/Chest: Effort normal.  She has only a couple faint expiratory wheezes. No rales  Lymphadenopathy:    She has no cervical adenopathy.          Assessment & Plan:  Acute upper respiratory infection. History of tobacco use. She is strongly advised to quit smoking. Start doxycycline 100 mg twice daily for 10 days. Proventil inhaler as needed. Get back on Dulera 2 puffs twice daily and she will followup with primary regarding advised duration of therapy

## 2013-06-03 NOTE — Progress Notes (Signed)
Pre visit review using our clinic review tool, if applicable. No additional management support is needed unless otherwise documented below in the visit note. 

## 2013-09-01 ENCOUNTER — Telehealth: Payer: Self-pay | Admitting: Internal Medicine

## 2013-09-01 NOTE — Telephone Encounter (Signed)
Patient Information:  Caller Name: Aminta  Phone: [phone]  Patient: [name] [name], [name] [name]  Gender: Female  DOB: 11/07/1955  Age: 58 Years  PCP: Shawna Orleans Doe-Hyun Herbie [place]) (Adults only)  Office Follow Up:  Does the office need to follow up with this patient?: No  Instructions For The Office: N/A  RN Note:  Shantice states she developed nasal congestion on 08/31/13.  Today, having bilateral ear pressure/congestion and sinus pressure.  Afebrile.  States she had 2 pills of Doxycycline from a previous script.  Has taken them and requesting refill of Doxycycline.  Informed, abx's are not called in and she would need to be seen.  Offered appt for today or tomorrow.  Pt declines and states she will watch it and see.  Symptoms  Reason For Call & Symptoms: Sinus and ear congestion  Reviewed Health History In EMR: Yes  Reviewed Medications In EMR: Yes  Reviewed Allergies In EMR: Yes  Reviewed Surgeries / Procedures: Yes  Date of Onset of Symptoms: 08/31/2013  Treatments Tried: Saline and 2 Doxycycline pills left over from previous script  Treatments Tried Worked: No  Guideline(s) Used:  Colds  Disposition Per Guideline:   See Today or Tomorrow in Office  Reason For Disposition Reached:   Using nasal washes and pain medicine > 24 hours and sinus pain (lower forehead, cheekbone, or eye) persists  Advice Given:  Call Back If:  You become worse  Patient Refused Recommendation:  Patient Refused Care Advice  Pt refuses appt.

## 2013-09-01 NOTE — Telephone Encounter (Signed)
I agree with recommendations.  In general pt should not take "left over antibiotics".  Contact office if symptoms get worse.

## 2013-09-02 NOTE — Telephone Encounter (Signed)
Pt aware.

## 2013-09-03 ENCOUNTER — Encounter: Payer: Self-pay | Admitting: Internal Medicine

## 2013-09-03 ENCOUNTER — Encounter: Payer: Self-pay | Admitting: *Deleted

## 2013-09-03 ENCOUNTER — Ambulatory Visit (INDEPENDENT_AMBULATORY_CARE_PROVIDER_SITE_OTHER): Payer: BC Managed Care – PPO | Admitting: Internal Medicine

## 2013-09-03 VITALS — BP 120/80 | HR 80 | Temp 98.4°F | Wt 209.0 lb

## 2013-09-03 DIAGNOSIS — R232 Flushing: Secondary | ICD-10-CM

## 2013-09-03 DIAGNOSIS — N951 Menopausal and female climacteric states: Secondary | ICD-10-CM

## 2013-09-03 DIAGNOSIS — J069 Acute upper respiratory infection, unspecified: Secondary | ICD-10-CM

## 2013-09-03 MED ORDER — ESTRADIOL 0.075 MG/24HR TD PTTW: 1.0000 | MEDICATED_PATCH | TRANSDERMAL | Status: DC

## 2013-09-03 NOTE — Progress Notes (Signed)
Subjective:    Patient ID: [name] [name], female    DOB: 1955/08/10, 58 y.o.   MRN: [id number]  HPI  58 year old white female with history of tobacco use, hyperlipidemia and abnormal glucose complains of cough and congestion for 3 days. Her cough is nonproductive. She is afebrile. Patient reports bilateral ear pressure. Patient reports she took 2 doses of doxycycline that she had left over from previous prescription.  Patient on hormone replacement for hot flashes. She requests refill for her estrogen patches. She has not been able to followup with her gynecologist.  Review of Systems Negative for shortness of breath    Past Medical History  Diagnosis Date  . GERD (gastroesophageal reflux disease)   . MVP (mitral valve prolapse)     history of   . Hyperlipidemia   . Abnormal glucose   . Tobacco abuse     tried Chantix- caused agression  . Osteoporosis   . Anxiety   . Esophageal stricture     Dr.Dora Olevia Perches  . History of colonic polyps     History   Social History  . Marital Status: Married    Spouse Name: N/A    Number of Children: N/A  . Years of Education: N/A   Occupational History  . administrative assitant     Social History Main Topics  . Smoking status: Smoker, Current Status Unknown -- 1.50 packs/day for 30 years    Start date: 07/15/1978  . Smokeless tobacco: Not on file  . Alcohol Use: No  . Drug Use: No  . Sexual Activity: Not Currently   Other Topics Concern  . 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 Breast biopsy  2000  . Appendectomy  2008    Family History  Problem Relation Age of Onset  . Hyperlipidemia    . Hypertension Mother   . Alcohol abuse Father     . Cirrhosis Father     deceased form cirrhosis  . Other      Fh of abnormal glucose  . Hypertension Father   . Hypertension Sister   . Hypertension Brother   . Cancer Mother     apendix    Allergies  Allergen Reactions  . Penicillins     REACTION: whelps, ,skin hot to touch    Current Outpatient Prescriptions on File Prior to Visit  Medication Sig Dispense Refill  . albuterol (PROVENTIL HFA;VENTOLIN HFA) 108 (90 BASE) MCG/ACT inhaler Inhale 2 puffs into the lungs every 6 (six) hours as needed for wheezing.  6.7 g  5  . citalopram (CELEXA) 20 MG tablet Take 1 tablet (20 mg total) by mouth daily.  90 tablet  1  . doxycycline (VIBRA-TABS) 100 MG tablet Take 1 tablet (100 mg total) by mouth 2 (two) times daily.  20 tablet  0  . esomeprazole (NEXIUM) 40 MG capsule Take 1 capsule (40 mg total) by mouth 2 (two) times daily.  60 capsule  5  . mometasone-formoterol (DULERA) 200-5 MCG/ACT AERO Inhale 2 puffs into the lungs 2 (two) times daily.  8.8 g  1   No current facility-administered  medications on file prior to visit.    BP 120/80  Pulse 80  Temp(Src) 98.4 F (36.9 C) (Oral)  Wt 209 lb (94.802 kg)  SpO2 95%    Objective:   Physical Exam  Constitutional: She is oriented to person, place, and time. She appears well-developed and well-nourished. No distress.  Cardiovascular: Normal rate, regular rhythm and normal heart sounds.   No murmur heard. Pulmonary/Chest: Effort normal and breath sounds normal. She has no wheezes.  Musculoskeletal: She exhibits no edema.  Neurological: She is alert and oriented to person, place, and time.  Psychiatric: She has a normal mood and affect. Her behavior is normal.          Assessment & Plan:

## 2013-09-03 NOTE — Progress Notes (Signed)
Pre visit review using our clinic review tool, if applicable. No additional management support is needed unless otherwise documented below in the visit note. 

## 2013-09-03 NOTE — Assessment & Plan Note (Signed)
Patient has signs and symptoms of viral upper respiratory infection. I recommended symptomatic treatment. Use intranasal saline and gargle with warm saltwater. Also use over-the-counter Mucinex DM as needed.  Patient advised to call office if symptoms persist or worsen.

## 2013-09-03 NOTE — Patient Instructions (Signed)
Gargle with warm salt water and use intranasal saline as directed. Use over-the-counter Mucinex DM every 12 hours as needed Please contact our office if your symptoms do not improve or gets worse.

## 2013-09-03 NOTE — Assessment & Plan Note (Signed)
Additional refill for Vivelle-Dot provided for 2 months.  Patient urged to followup with her gynecologist.

## 2013-09-06 ENCOUNTER — Telehealth: Payer: Self-pay | Admitting: Internal Medicine

## 2013-09-06 NOTE — Telephone Encounter (Signed)
Relevant patient education assigned to patient using Emmi. ° °

## 2013-09-28 ENCOUNTER — Emergency Department (HOSPITAL_COMMUNITY): Payer: BC Managed Care – PPO

## 2013-09-28 ENCOUNTER — Emergency Department (HOSPITAL_COMMUNITY)
Admission: EM | Admit: 2013-09-28 | Discharge: 2013-09-28 | Disposition: A | Discharge status: Home / Self Care | Payer: BC Managed Care – PPO | Attending: Emergency Medicine | Admitting: Emergency Medicine

## 2013-09-28 ENCOUNTER — Encounter (HOSPITAL_COMMUNITY): Payer: Self-pay | Admitting: Emergency Medicine

## 2013-09-28 DIAGNOSIS — Z860100 Personal history of colon polyps, unspecified: Secondary | ICD-10-CM | POA: Insufficient documentation

## 2013-09-28 DIAGNOSIS — Z8739 Personal history of other diseases of the musculoskeletal system and connective tissue: Secondary | ICD-10-CM | POA: Insufficient documentation

## 2013-09-28 DIAGNOSIS — K5289 Other specified noninfective gastroenteritis and colitis: Secondary | ICD-10-CM | POA: Insufficient documentation

## 2013-09-28 DIAGNOSIS — K219 Gastro-esophageal reflux disease without esophagitis: Secondary | ICD-10-CM | POA: Insufficient documentation

## 2013-09-28 DIAGNOSIS — Z8679 Personal history of other diseases of the circulatory system: Secondary | ICD-10-CM | POA: Insufficient documentation

## 2013-09-28 DIAGNOSIS — Z880 Allergy status to penicillin: Secondary | ICD-10-CM | POA: Insufficient documentation

## 2013-09-28 DIAGNOSIS — J329 Chronic sinusitis, unspecified: Secondary | ICD-10-CM

## 2013-09-28 DIAGNOSIS — K529 Noninfective gastroenteritis and colitis, unspecified: Secondary | ICD-10-CM

## 2013-09-28 DIAGNOSIS — F411 Generalized anxiety disorder: Secondary | ICD-10-CM | POA: Insufficient documentation

## 2013-09-28 DIAGNOSIS — E785 Hyperlipidemia, unspecified: Secondary | ICD-10-CM | POA: Insufficient documentation

## 2013-09-28 DIAGNOSIS — Z79899 Other long term (current) drug therapy: Secondary | ICD-10-CM | POA: Insufficient documentation

## 2013-09-28 DIAGNOSIS — F17200 Nicotine dependence, unspecified, uncomplicated: Secondary | ICD-10-CM | POA: Insufficient documentation

## 2013-09-28 DIAGNOSIS — IMO0002 Reserved for concepts with insufficient information to code with codable children: Secondary | ICD-10-CM | POA: Insufficient documentation

## 2013-09-28 LAB — CBC WITH DIFFERENTIAL/PLATELET
Basophils Absolute: 0 10*3/uL (ref 0.0–0.1)
Basophils Relative: 0 % (ref 0–1)
Eosinophils Absolute: 0.1 10*3/uL (ref 0.0–0.7)
Eosinophils Relative: 1 % (ref 0–5)
HCT: 45.7 % (ref 36.0–46.0)
HEMOGLOBIN: 15.3 g/dL — AB (ref 12.0–15.0)
LYMPHS ABS: 0.8 10*3/uL (ref 0.7–4.0)
Lymphocytes Relative: 9 % — ABNORMAL LOW (ref 12–46)
MCH: 31 pg (ref 26.0–34.0)
MCHC: 33.5 g/dL (ref 30.0–36.0)
MCV: 92.5 fL (ref 78.0–100.0)
Monocytes Absolute: 0.5 10*3/uL (ref 0.1–1.0)
Monocytes Relative: 5 % (ref 3–12)
NEUTROS ABS: 8.3 10*3/uL — AB (ref 1.7–7.7)
NEUTROS PCT: 85 % — AB (ref 43–77)
Platelets: 198 10*3/uL (ref 150–400)
RBC: 4.94 MIL/uL (ref 3.87–5.11)
RDW: 13.3 % (ref 11.5–15.5)
WBC: 9.7 10*3/uL (ref 4.0–10.5)

## 2013-09-28 LAB — COMPREHENSIVE METABOLIC PANEL
ALBUMIN: 3.8 g/dL (ref 3.5–5.2)
ALK PHOS: 106 U/L (ref 39–117)
ALT: 14 U/L (ref 0–35)
AST: 13 U/L (ref 0–37)
BILIRUBIN TOTAL: 0.4 mg/dL (ref 0.3–1.2)
BUN: 9 mg/dL (ref 6–23)
CHLORIDE: 100 meq/L (ref 96–112)
CO2: 22 mEq/L (ref 19–32)
Calcium: 9.3 mg/dL (ref 8.4–10.5)
Creatinine, Ser: 0.5 mg/dL (ref 0.50–1.10)
GFR calc Af Amer: >90 mL/min (ref >90)
GFR calc non Af Amer: >90 mL/min (ref >90)
GLUCOSE: 99 mg/dL (ref 70–99)
POTASSIUM: 4.5 meq/L (ref 3.7–5.3)
SODIUM: 138 meq/L (ref 137–147)
Total Protein: 7.6 g/dL (ref 6.0–8.3)

## 2013-09-28 LAB — LIPASE, BLOOD: LIPASE: 29 U/L (ref 11–59)

## 2013-09-28 MED ORDER — SODIUM CHLORIDE 0.9 % IV BOLUS (SEPSIS)
1000.0000 mL | Freq: Once | INTRAVENOUS | Status: AC
  Administered 2013-09-28: 1000 mL via INTRAVENOUS

## 2013-09-28 MED ORDER — SODIUM CHLORIDE 0.9 % IV SOLN
INTRAVENOUS | Status: DC
  Administered 2013-09-28: 14:00:00 via INTRAVENOUS

## 2013-09-28 MED ORDER — DIPHENOXYLATE-ATROPINE 2.5-0.025 MG PO TABS: 2.0000 | ORAL_TABLET | Freq: Four times a day (QID) | ORAL | Status: DC | PRN

## 2013-09-28 MED ORDER — ONDANSETRON 8 MG PO TBDP: 8.0000 mg | ORAL_TABLET | Freq: Three times a day (TID) | ORAL | Status: DC | PRN

## 2013-09-28 MED ORDER — ONDANSETRON HCL 4 MG/2ML IJ SOLN
4.0000 mg | Freq: Once | INTRAMUSCULAR | Status: AC
Start: 2013-09-28 — End: 2013-09-28
  Administered 2013-09-28: 4 mg via INTRAVENOUS
  Filled 2013-09-28: qty 2

## 2013-09-28 MED ORDER — LOPERAMIDE HCL 2 MG PO CAPS
2.0000 mg | ORAL_CAPSULE | ORAL | Status: DC | PRN
Start: 2013-09-28 — End: 2013-09-28
  Administered 2013-09-28 (×2): 2 mg via ORAL
  Filled 2013-09-28 (×2): qty 1

## 2013-09-28 MED ORDER — PROMETHAZINE HCL 25 MG PO TABS: 25.0000 mg | ORAL_TABLET | Freq: Four times a day (QID) | ORAL | Status: DC | PRN

## 2013-09-28 MED ORDER — AZITHROMYCIN 250 MG PO TABS: ORAL_TABLET | ORAL | Status: DC

## 2013-09-28 NOTE — ED Provider Notes (Signed)
CSN: [id number]     Arrival date & time 09/28/13  1136 History   First MD Initiated Contact with Patient 09/28/13 1152     Chief Complaint  Patient presents with  . Diarrhea     (Consider location/radiation/quality/duration/timing/severity/associated sxs/prior Treatment) Patient is a 58 y.o. female presenting with diarrhea. The history is provided by the patient and a relative.  Diarrhea  patient here with acute onset of nonbilious vomiting with watery diarrhea that began early this morning. She notes being lightheaded when she stands up. Denies any sick exposures. No fever or chills. Some diffuse abdominal cramping prior to the diarrhea episodes. Has had a nonproductive cough. Recently has been treated for a sinus infection. Denies any headache or photophobia. No neck pain. No rashes noted. Symptoms are persistent. EMS was called and gave patient an IV saline and patient is also be better.  Past Medical History  Diagnosis Date  . GERD (gastroesophageal reflux disease)   . MVP (mitral valve prolapse)     history of   . Hyperlipidemia   . Abnormal glucose   . Tobacco abuse     tried Chantix- caused agression  . Osteoporosis   . Anxiety   . Esophageal stricture     Dr.Dora Olevia Perches  . History of colonic polyps    Past Surgical History  Procedure Laterality Date  . Breast biopsy  2000  . Appendectomy  2008   Family History  Problem Relation Age of Onset  . Hyperlipidemia    . Hypertension Mother   . Alcohol abuse Father   . Cirrhosis Father     deceased form cirrhosis  . Other      Fh of abnormal glucose  . Hypertension Father   . Hypertension Sister   . Hypertension Brother   . Cancer Mother     apendix   History  Substance Use Topics  . Smoking status: Smoker, Current Status Unknown -- 1.50 packs/day for 30 years    Start date: 07/15/1978  . Smokeless tobacco: Not on file  . Alcohol Use: No   OB History   Grav Para Term Preterm Abortions TAB SAB Ect Mult Living                  Review of Systems  Gastrointestinal: Positive for diarrhea.  All other systems reviewed and are negative.      Allergies  Penicillins  Home Medications   Current Outpatient Rx  Name  Route  Sig  Dispense  Refill  . acetaminophen (TYLENOL) 500 MG tablet   Oral   Take 500 mg by mouth every 6 (six) hours as needed for mild pain.         Marland Kitchen albuterol (PROVENTIL HFA;VENTOLIN HFA) 108 (90 BASE) MCG/ACT inhaler   Inhalation   Inhale 2 puffs into the lungs every 6 (six) hours as needed for wheezing.   6.7 g   5   . citalopram (CELEXA) 20 MG tablet   Oral   Take 1 tablet (20 mg total) by mouth daily.   90 tablet   1   . esomeprazole (NEXIUM) 40 MG capsule   Oral   Take 1 capsule (40 mg total) by mouth 2 (two) times daily.   60 capsule   5   . estradiol (VIVELLE-DOT) 0.075 MG/24HR   Transdermal   Place 1 patch onto the skin 2 (two) times a week.   8 patch   2   . mometasone (NASONEX) 50 MCG/ACT nasal spray  Nasal   Place 2 sprays into the nose daily.         . mometasone-formoterol (DULERA) 200-5 MCG/ACT AERO   Inhalation   Inhale 2 puffs into the lungs 2 (two) times daily.   8.8 g   1   . pseudoephedrine (SUDAFED) 30 MG tablet   Oral   Take 30 mg by mouth every 4 (four) hours as needed for congestion.          BP 149/75  Pulse 83  Temp(Src) 98.4 F (36.9 C) (Oral)  Resp 19  SpO2 95% Physical Exam  Nursing note and vitals reviewed. Constitutional: She is oriented to person, place, and time. She appears well-developed and well-nourished.  Non-toxic appearance. No distress.  HENT:  Head: Normocephalic and atraumatic.  Eyes: Conjunctivae, EOM and lids are normal. Pupils are equal, round, and reactive to light.  Neck: Normal range of motion. Neck supple. No tracheal deviation present. No mass present.  Cardiovascular: Normal rate, regular rhythm and normal heart sounds.  Exam reveals no gallop.   No murmur heard. Pulmonary/Chest:  Effort normal and breath sounds normal. No stridor. No respiratory distress. She has no decreased breath sounds. She has no wheezes. She has no rhonchi. She has no rales.  Abdominal: Soft. Normal appearance and bowel sounds are normal. She exhibits no distension. There is no tenderness. There is no rebound and no CVA tenderness.  Musculoskeletal: Normal range of motion. She exhibits no edema and no tenderness.  Neurological: She is alert and oriented to person, place, and time. She has normal strength. No cranial nerve deficit or sensory deficit. GCS eye subscore is 4. GCS verbal subscore is 5. GCS motor subscore is 6.  Skin: Skin is warm and dry. No abrasion and no rash noted.  Psychiatric: She has a normal mood and affect. Her speech is normal and behavior is normal.    ED Course  Procedures (including critical care time) Labs Review Labs Reviewed  CBC WITH DIFFERENTIAL - Abnormal; Notable for the following:    Hemoglobin 15.3 (*)    Neutrophils Relative % 85 (*)    Neutro Abs 8.3 (*)    Lymphocytes Relative 9 (*)    All other components within normal limits  COMPREHENSIVE METABOLIC PANEL  LIPASE, BLOOD   Imaging Review Dg Chest 2 View  09/28/2013   CLINICAL DATA:  Chest pain and congestion  EXAM: CHEST  2 VIEW  COMPARISON:  October 21, 2008  FINDINGS: The interstitium is mildly prominent, probably reflecting underlying chronic inflammatory type change. There is no edema or consolidation. Heart size and pulmonary vascularity are normal. No adenopathy. No bone lesions.  IMPRESSION: The interstitium is mildly prominent in a pattern suggesting chronic inflammatory type change. There is no edema or consolidation.   Electronically Signed   By: Lowella Grip M.D.   On: 09/28/2013 13:31   Ct Maxillofacial Wo Cm  09/28/2013   CLINICAL DATA:  sinusitis  EXAM: CT MAXILLOFACIAL WITHOUT CONTRAST  TECHNIQUE: Multidetector CT imaging of the maxillofacial structures was performed. Multiplanar CT image  reconstructions were also generated. A small metallic BB was placed on the right temple in order to reliably differentiate right from left.  COMPARISON:  NM BONE WHOLE BODY dated 09/14/2009; CT HEAD W/O CM dated 10/21/2008  FINDINGS: Areas of mild to moderate mucosal thickening appreciated within the ethmoid air cells and mild mucosal thickening within the right maxillary sinus. There is no evidence of air-fluid levels nor regions of opacification. There is  ostial stenosis involving the ostia of the right maxillary sinus to a lesser extent the left. The turbinates are unremarkable.  There is no evidence of fracture, dislocation or malalignment.  The orbits are unremarkable. The mandible and temporomandibular joints are unremarkable. The mastoid air cells are patent.  IMPRESSION: Areas of mucosal thickening within the ethmoid air cells and right maxillary sinus which may represent sequela of sinus disease, inflammatory versus infectious. No further evidence appreciated raise concern of sinusitis.   Electronically Signed   By: Margaree Mackintosh M.D.   On: 09/28/2013 13:51     EKG Interpretation None      MDM   Final diagnoses:  None    Patient given IV fluids and anti-medics as well as anti-motility agents and feels better. We'll be treated for her sinusitis. Also will be given prescription for Lomotil and Zofran.    Leota Jacobsen, MD 09/28/13 [phone]

## 2013-09-28 NOTE — ED Notes (Addendum)
Per EMS: Pt reports having nausea, emesis, and diarrhea that began at 0500. Pt reports lightheadedness. Pt A/O x4. Pt given 200 ml NS prior to arrival by EMS.

## 2013-09-28 NOTE — Discharge Instructions (Signed)
Viral Gastroenteritis Viral gastroenteritis is also known as stomach flu. This condition affects the stomach and intestinal tract. It can cause sudden diarrhea and vomiting. The illness typically lasts 3 to 8 days. Most people develop an immune response that eventually gets rid of the virus. While this natural response develops, the virus can make you quite ill. CAUSES  Many different viruses can cause gastroenteritis, such as rotavirus or noroviruses. You can catch one of these viruses by consuming contaminated food or water. You may also catch a virus by sharing utensils or other personal items with an infected person or by touching a contaminated surface. SYMPTOMS  The most common symptoms are diarrhea and vomiting. These problems can cause a severe loss of body fluids (dehydration) and a body salt (electrolyte) imbalance. Other symptoms may include:  Fever.  Headache.  Fatigue.  Abdominal pain. DIAGNOSIS  Your caregiver can usually diagnose viral gastroenteritis based on your symptoms and a physical exam. A stool sample may also be taken to test for the presence of viruses or other infections. TREATMENT  This illness typically goes away on its own. Treatments are aimed at rehydration. The most serious cases of viral gastroenteritis involve vomiting so severely that you are not able to keep fluids down. In these cases, fluids must be given through an intravenous line (IV). HOME CARE INSTRUCTIONS   Drink enough fluids to keep your urine clear or pale yellow. Drink small amounts of fluids frequently and increase the amounts as tolerated.  Ask your caregiver for specific rehydration instructions.  Avoid:  Foods high in sugar.  Alcohol.  Carbonated drinks.  Tobacco.  Juice.  Caffeine drinks.  Extremely hot or cold fluids.  Fatty, greasy foods.  Too much intake of anything at one time.  Dairy products until 24 to 48 hours after diarrhea stops.  You may consume probiotics.  Probiotics are active cultures of beneficial bacteria. They may lessen the amount and number of diarrheal stools in adults. Probiotics can be found in yogurt with active cultures and in supplements.  Wash your hands well to avoid spreading the virus.  Only take over-the-counter or prescription medicines for pain, discomfort, or fever as directed by your caregiver. Do not give aspirin to children. Antidiarrheal medicines are not recommended.  Ask your caregiver if you should continue to take your regular prescribed and over-the-counter medicines.  Keep all follow-up appointments as directed by your caregiver. SEEK IMMEDIATE MEDICAL CARE IF:   You are unable to keep fluids down.  You do not urinate at least once every 6 to 8 hours.  You develop shortness of breath.  You notice blood in your stool or vomit. This may look like coffee grounds.  You have abdominal pain that increases or is concentrated in one small area (localized).  You have persistent vomiting or diarrhea.  You have a fever.  The patient is a child younger than 3 months, and he or she has a fever.  The patient is a child older than 3 months, and he or she has a fever and persistent symptoms.  The patient is a child older than 3 months, and he or she has a fever and symptoms suddenly get worse.  The patient is a baby, and he or she has no tears when crying. MAKE SURE YOU:   Understand these instructions.  Will watch your condition.  Will get help right away if you are not doing well or get worse. Document Released: 07/01/2005 Document Revised: 09/23/2011 Document Reviewed: 04/17/2011  ExitCare Patient Information 2014 [place]. Sinusitis Sinusitis is redness, soreness, and swelling (inflammation) of the paranasal sinuses. Paranasal sinuses are air pockets within the bones of your face (beneath the eyes, the middle of the forehead, or above the eyes). In healthy paranasal sinuses, mucus is able to drain out,  and air is able to circulate through them by way of your nose. However, when your paranasal sinuses are inflamed, mucus and air can become trapped. This can allow bacteria and other germs to grow and cause infection. Sinusitis can develop quickly and last only a short time (acute) or continue over a long period (chronic). Sinusitis that lasts for more than 12 weeks is considered chronic.  CAUSES  Causes of sinusitis include:  Allergies.  Structural abnormalities, such as displacement of the cartilage that separates your nostrils (deviated septum), which can decrease the air flow through your nose and sinuses and affect sinus drainage.  Functional abnormalities, such as when the small hairs (cilia) that line your sinuses and help remove mucus do not work properly or are not present. SYMPTOMS  Symptoms of acute and chronic sinusitis are the same. The primary symptoms are pain and pressure around the affected sinuses. Other symptoms include:  Upper toothache.  Earache.  Headache.  Bad breath.  Decreased sense of smell and taste.  A cough, which worsens when you are lying flat.  Fatigue.  Fever.  Thick drainage from your nose, which often is green and may contain pus (purulent).  Swelling and warmth over the affected sinuses. DIAGNOSIS  Your caregiver will perform a physical exam. During the exam, your caregiver may:  Look in your nose for signs of abnormal growths in your nostrils (nasal polyps).  Tap over the affected sinus to check for signs of infection.  View the inside of your sinuses (endoscopy) with a special imaging device with a light attached (endoscope), which is inserted into your sinuses. If your caregiver suspects that you have chronic sinusitis, one or more of the following tests may be recommended:  Allergy tests.  Nasal culture A sample of mucus is taken from your nose and sent to a lab and screened for bacteria.  Nasal cytology A sample of mucus is taken  from your nose and examined by your caregiver to determine if your sinusitis is related to an allergy. TREATMENT  Most cases of acute sinusitis are related to a viral infection and will resolve on their own within 10 days. Sometimes medicines are prescribed to help relieve symptoms (pain medicine, decongestants, nasal steroid sprays, or saline sprays).  However, for sinusitis related to a bacterial infection, your caregiver will prescribe antibiotic medicines. These are medicines that will help kill the bacteria causing the infection.  Rarely, sinusitis is caused by a fungal infection. In theses cases, your caregiver will prescribe antifungal medicine. For some cases of chronic sinusitis, surgery is needed. Generally, these are cases in which sinusitis recurs more than 3 times per year, despite other treatments. HOME CARE INSTRUCTIONS   Drink plenty of water. Water helps thin the mucus so your sinuses can drain more easily.  Use a humidifier.  Inhale steam 3 to 4 times a day (for example, sit in the bathroom with the shower running).  Apply a warm, moist washcloth to your face 3 to 4 times a day, or as directed by your caregiver.  Use saline nasal sprays to help moisten and clean your sinuses.  Take over-the-counter or prescription medicines for pain, discomfort, or fever only as  directed by your caregiver. SEEK IMMEDIATE MEDICAL CARE IF:  You have increasing pain or severe headaches.  You have nausea, vomiting, or drowsiness.  You have swelling around your face.  You have vision problems.  You have a stiff neck.  You have difficulty breathing. MAKE SURE YOU:   Understand these instructions.  Will watch your condition.  Will get help right away if you are not doing well or get worse. Document Released: 07/01/2005 Document Revised: 09/23/2011 Document Reviewed: 07/16/2011 [facility] Patient Information 2014 [place], [place].

## 2013-09-30 ENCOUNTER — Telehealth: Payer: Self-pay | Admitting: Internal Medicine

## 2013-09-30 NOTE — Telephone Encounter (Signed)
Patient Information:  Caller Name: [place]  Phone: [phone]  Patient: [name] [name], [name] [name]  Gender: Female  DOB: Feb 18, 1956  Age: 58 Years  PCP: Shawna Orleans [facility] [place]) (Adults only)  Office Follow Up:  Does the office need to follow up with this patient?: Yes  Instructions For The Office: Pt  needs appt and would prefer to see Dr Shawna Orleans and will wait until tomorrow to see him tomorrow if he can see her.  Please f/u with pt concerning appt needs.  Thank you.   Symptoms  Reason For Call & Symptoms: Pt states she has been sick and seen in the ED for dehydration, headace, nausea, indigestion, earache  wants an appt with provider.  Reviewed Health History In EMR: Yes  Reviewed Medications In EMR: Yes  Reviewed Allergies In EMR: Yes  Reviewed Surgeries / Procedures: Yes  Date of Onset of Symptoms: 09/26/2013  Guideline(s) Used:  Earache  Disposition Per Guideline:   See Today in Office  Reason For Disposition Reached:   Patient wants to be seen  Advice Given:  Call Back If  You become worse.  Patient Will Follow Care Advice:  YES

## 2013-10-01 ENCOUNTER — Ambulatory Visit (INDEPENDENT_AMBULATORY_CARE_PROVIDER_SITE_OTHER): Payer: BC Managed Care – PPO | Admitting: Family Medicine

## 2013-10-01 VITALS — BP 110/70 | HR 80 | Temp 98.7°F | Wt 203.0 lb

## 2013-10-01 DIAGNOSIS — H6090 Unspecified otitis externa, unspecified ear: Secondary | ICD-10-CM

## 2013-10-01 DIAGNOSIS — B9789 Other viral agents as the cause of diseases classified elsewhere: Secondary | ICD-10-CM

## 2013-10-01 DIAGNOSIS — B349 Viral infection, unspecified: Secondary | ICD-10-CM

## 2013-10-01 DIAGNOSIS — H60399 Other infective otitis externa, unspecified ear: Secondary | ICD-10-CM

## 2013-10-01 MED ORDER — CIPROFLOXACIN-DEXAMETHASONE 0.3-0.1 % OT SUSP: 4.0000 [drp] | Freq: Two times a day (BID) | OTIC | Status: DC

## 2013-10-01 NOTE — Progress Notes (Signed)
Chief Complaint  Patient presents with  . post ER follow up    HPI:  [name] [name], a 58 yo pt of Dr. Shawna Orleans, with PMH sig for Anxiety, depression, Allergic rhinitis, wheezing, IBS symptoms is here for follow up of:  Ear Pain/Diarrhea/Vomiting: -recent gastroenteritis which started 3-4 days ago with profuse watery diarrhea and vomiting and sinus issues and seen in ED for significant dehydration and tx with lomotil, zofran, ivf and abx for sinusitis -had CT of sinuses and CXR -reports: improving diarrhea, no more vomiting, no blood in stools, travel, R ear pain, is feeling better in general -denies: ABD pain, blood in stool, persistent vomiting, fevers   ROS: See pertinent positives and negatives per HPI.  Past Medical History  Diagnosis Date  . GERD (gastroesophageal reflux disease)   . MVP (mitral valve prolapse)     history of   . Hyperlipidemia   . Abnormal glucose   . Tobacco abuse     tried Chantix- caused agression  . Osteoporosis   . Anxiety   . Esophageal stricture     Dr.Dora Olevia Perches  . History of colonic polyps     Past Surgical History  Procedure Laterality Date  . Breast biopsy  2000  . Appendectomy  2008    Family History  Problem Relation Age of Onset  . Hyperlipidemia    . Hypertension Mother   . Alcohol abuse Father   . Cirrhosis Father     deceased form cirrhosis  . Other      Fh of abnormal glucose  . Hypertension Father   . Hypertension Sister   . Hypertension Brother   . Cancer Mother     apendix    History   Social History  . Marital Status: Married    Spouse Name: N/A    Number of Children: N/A  . Years of Education: N/A   Occupational History  . administrative assitant     Social History Main Topics  . Smoking status: Smoker, Current Status Unknown -- 1.50 packs/day for 30 years    Start date: 07/15/1978  . Smokeless tobacco: Not on file  . Alcohol Use: No  . Drug Use: No  . Sexual Activity: Not Currently   Other Topics  Concern  . 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025 MG per tablet, Take 2 tablets by mouth 4 (four) times daily as needed for diarrhea or loose stools., Disp: 30 tablet, Rfl: 0;  esomeprazole (NEXIUM) 40 MG capsule, Take 1 capsule (40 mg total) by mouth 2 (two) times daily., Disp: 60 capsule, Rfl: 5;  estradiol (VIVELLE-DOT) 0.075 MG/24HR, Place 1 patch onto the skin 2 (two) times a week., Disp: 8 patch, Rfl: 2  mometasone (NASONEX) 50 MCG/ACT nasal spray, Place 2 sprays into the nose daily., Disp: , Rfl: ;  mometasone-formoterol (DULERA) 200-5 MCG/ACT AERO, Inhale 2 puffs into the lungs 2 (two) times daily., Disp: 8.8 g, Rfl: 1;  ondansetron (ZOFRAN ODT) 8 MG disintegrating tablet, Take 1 tablet (8 mg total) by mouth every 8 (eight) hours as needed for nausea or vomiting., Disp: 20 tablet, Rfl: 0 ciprofloxacin-dexamethasone (CIPRODEX) otic suspension, Place 4 drops into the right ear 2 (two) times daily., Disp: 7.5 mL, Rfl: 0;  [DISCONTINUED] promethazine (PHENERGAN) 25 MG tablet, Take 1 tablet (25 mg total) by mouth every 6 (six) hours as needed for nausea or vomiting., Disp: 15 tablet, Rfl: 0  EXAM:  Filed Vitals:   10/01/13 1346  BP: 110/70  Pulse: 80  Temp: 98.7 F (37.1 C)    Body mass index is 33.78  kg/(m^2).  GENERAL: vitals reviewed and listed above, alert, oriented, appears well hydrated and in no acute distress  HEENT: atraumatic, conjunttiva clear, no obvious abnormalities on inspection of external nose and ears, normal appearance of ear canals and TMs except for some erythemaof R ear canal without debris or TTP behind ear, clear nasal congestion, mild post oropharyngeal erythema with PND, no tonsillar edema or exudate, no sinus TTP   NECK: no obvious masses on inspection  LUNGS: clear to auscultation bilaterally, no wheezes, rales or rhonchi, good air movement  CV: HRRR, no peripheral edema  MS: moves all extremities without noticeable abnormality  PSYCH: pleasant and cooperative, no obvious depression or anxiety  ASSESSMENT AND PLAN:  Discussed the following assessment and plan:  Otitis externa - Plan: ciprofloxacin-dexamethasone (CIPRODEX) otic suspension  Viral illness  -seems to be recovering from a viral illness -possible R otitis externa without signs of otitis media or complications -follow up next week with PCP  -Patient advised to return or notify a doctor immediately if symptoms worsen or persist or new concerns arise.  Patient Instructions  Viral Gastroenteritis Viral gastroenteritis is also known as stomach flu. This condition affects the stomach and intestinal tract. It can cause sudden diarrhea and vomiting. The illness typically lasts 3 to 8 days. Most people develop an immune response that eventually gets rid of the virus. While this natural response develops, the virus can make you quite ill. CAUSES  Many different viruses can cause gastroenteritis, such as rotavirus or noroviruses. You can catch one of these viruses by consuming contaminated food or water. You may also catch a virus by sharing utensils or other personal items with an infected person or by touching a contaminated surface. SYMPTOMS  The most common symptoms are diarrhea and vomiting.  These problems can cause a severe loss of body fluids (dehydration) and a body salt (electrolyte) imbalance. Other symptoms may include:  Fever.  Headache.  Fatigue.  Abdominal pain. DIAGNOSIS  Your caregiver can usually diagnose viral gastroenteritis based on your symptoms and a physical exam. A stool sample may also be taken to test for the presence of viruses or other infections. TREATMENT  This illness typically goes away on its own. Treatments are aimed at rehydration. The most serious cases of viral gastroenteritis involve vomiting so severely that you are not able to keep fluids down. In these cases, fluids must be given through an intravenous line (IV). HOME CARE INSTRUCTIONS   Drink enough fluids to keep your urine clear or pale yellow. Drink small amounts of fluids frequently and increase the amounts as tolerated.  Ask your caregiver for specific  rehydration instructions.  Avoid:  Foods high in sugar.  Alcohol.  Carbonated drinks.  Tobacco.  Juice.  Caffeine drinks.  Extremely hot or cold fluids.  Fatty, greasy foods.  Too much intake of anything at one time.  Dairy products until 24 to 48 hours after diarrhea stops.  You may consume probiotics. Probiotics are active cultures of beneficial bacteria. They may lessen the amount and number of diarrheal stools in adults. Probiotics can be found in yogurt with active cultures and in supplements.  Wash your hands well to avoid spreading the virus.  Only take over-the-counter or prescription medicines for pain, discomfort, or fever as directed by your caregiver. Do not give aspirin to children. Antidiarrheal medicines are not recommended.  Ask your caregiver if you should continue to take your regular prescribed and over-the-counter medicines.  Keep all follow-up appointments as directed by your caregiver. SEEK IMMEDIATE MEDICAL CARE IF:   You are unable to keep fluids down.  You do not urinate at least once  every 6 to 8 hours.  You develop shortness of breath.  You notice blood in your stool or vomit. This may look like coffee grounds.  You have abdominal pain that increases or is concentrated in one small area (localized).  You have persistent vomiting or diarrhea.  You have a fever.  The patient is a child younger than 3 months, and he or she has a fever.  The patient is a child older than 3 months, and he or she has a fever and persistent symptoms.  The patient is a child older than 3 months, and he or she has a fever and symptoms suddenly get worse.  The patient is a baby, and he or she has no tears when crying. MAKE SURE YOU:   Understand these instructions.  Will watch your condition.  Will get help right away if you are not doing well or get worse. Document Released: 07/01/2005 Document Revised: 09/23/2011 Document Reviewed: 04/17/2011 [facility] Patient Information 2014 [place], [place], [place]

## 2013-10-01 NOTE — Patient Instructions (Signed)
Viral Gastroenteritis Viral gastroenteritis is also known as stomach flu. This condition affects the stomach and intestinal tract. It can cause sudden diarrhea and vomiting. The illness typically lasts 3 to 8 days. Most people develop an immune response that eventually gets rid of the virus. While this natural response develops, the virus can make you quite ill. CAUSES  Many different viruses can cause gastroenteritis, such as rotavirus or noroviruses. You can catch one of these viruses by consuming contaminated food or water. You may also catch a virus by sharing utensils or other personal items with an infected person or by touching a contaminated surface. SYMPTOMS  The most common symptoms are diarrhea and vomiting. These problems can cause a severe loss of body fluids (dehydration) and a body salt (electrolyte) imbalance. Other symptoms may include:  Fever.  Headache.  Fatigue.  Abdominal pain. DIAGNOSIS  Your caregiver can usually diagnose viral gastroenteritis based on your symptoms and a physical exam. A stool sample may also be taken to test for the presence of viruses or other infections. TREATMENT  This illness typically goes away on its own. Treatments are aimed at rehydration. The most serious cases of viral gastroenteritis involve vomiting so severely that you are not able to keep fluids down. In these cases, fluids must be given through an intravenous line (IV). HOME CARE INSTRUCTIONS   Drink enough fluids to keep your urine clear or pale yellow. Drink small amounts of fluids frequently and increase the amounts as tolerated.  Ask your caregiver for specific rehydration instructions.  Avoid:  Foods high in sugar.  Alcohol.  Carbonated drinks.  Tobacco.  Juice.  Caffeine drinks.  Extremely hot or cold fluids.  Fatty, greasy foods.  Too much intake of anything at one time.  Dairy products until 24 to 48 hours after diarrhea stops.  You may consume probiotics.  Probiotics are active cultures of beneficial bacteria. They may lessen the amount and number of diarrheal stools in adults. Probiotics can be found in yogurt with active cultures and in supplements.  Wash your hands well to avoid spreading the virus.  Only take over-the-counter or prescription medicines for pain, discomfort, or fever as directed by your caregiver. Do not give aspirin to children. Antidiarrheal medicines are not recommended.  Ask your caregiver if you should continue to take your regular prescribed and over-the-counter medicines.  Keep all follow-up appointments as directed by your caregiver. SEEK IMMEDIATE MEDICAL CARE IF:   You are unable to keep fluids down.  You do not urinate at least once every 6 to 8 hours.  You develop shortness of breath.  You notice blood in your stool or vomit. This may look like coffee grounds.  You have abdominal pain that increases or is concentrated in one small area (localized).  You have persistent vomiting or diarrhea.  You have a fever.  The patient is a child younger than 3 months, and he or she has a fever.  The patient is a child older than 3 months, and he or she has a fever and persistent symptoms.  The patient is a child older than 3 months, and he or she has a fever and symptoms suddenly get worse.  The patient is a baby, and he or she has no tears when crying. MAKE SURE YOU:   Understand these instructions.  Will watch your condition.  Will get help right away if you are not doing well or get worse. Document Released: 07/01/2005 Document Revised: 09/23/2011 Document Reviewed: 04/17/2011   [employer] Patient Information 2014 [employer]  

## 2013-10-01 NOTE — Progress Notes (Signed)
Pre visit review using our clinic review tool, if applicable. No additional management support is needed unless otherwise documented below in the visit note. 

## 2013-12-01 ENCOUNTER — Other Ambulatory Visit: Payer: Self-pay | Admitting: Internal Medicine

## 2013-12-04 ENCOUNTER — Other Ambulatory Visit: Payer: Self-pay | Admitting: Internal Medicine

## 2014-01-13 ENCOUNTER — Other Ambulatory Visit: Payer: Self-pay | Admitting: Internal Medicine

## 2014-02-23 ENCOUNTER — Ambulatory Visit (INDEPENDENT_AMBULATORY_CARE_PROVIDER_SITE_OTHER): Payer: BC Managed Care – PPO | Admitting: Physician Assistant

## 2014-02-23 ENCOUNTER — Encounter: Payer: Self-pay | Admitting: Physician Assistant

## 2014-02-23 VITALS — BP 120/84 | HR 95 | Temp 98.6°F | Resp 18 | Wt 203.0 lb

## 2014-02-23 DIAGNOSIS — J988 Other specified respiratory disorders: Secondary | ICD-10-CM

## 2014-02-23 MED ORDER — ALBUTEROL SULFATE HFA 108 (90 BASE) MCG/ACT IN AERS: 2.0000 | INHALATION_SPRAY | Freq: Four times a day (QID) | RESPIRATORY_TRACT | Status: DC | PRN

## 2014-02-23 MED ORDER — PREDNISONE 10 MG PO TABS: ORAL_TABLET | ORAL | Status: DC

## 2014-02-23 NOTE — Progress Notes (Signed)
Subjective:    Patient ID: [name] [name], female    DOB: [birth year], 57 y.o.   MRN: [id number]  Sinusitis This is a new problem. The current episode started in the past 7 days (4 days). The problem has been gradually worsening since onset. There has been no fever. Associated symptoms include congestion, coughing (dry), a hoarse voice, sinus pressure and a sore throat. Pertinent negatives include no chills, diaphoresis, ear pain (no pain, but fullness), headaches, neck pain, shortness of breath, sneezing or swollen glands. Treatments tried: dulera, sudafed, tylenol. The treatment provided mild relief.      Review of Systems  Constitutional: Negative for fever, chills and diaphoresis.  HENT: Positive for congestion, hoarse voice, postnasal drip, sinus pressure and sore throat. Negative for ear pain (no pain, but fullness) and sneezing.   Respiratory: Positive for cough (dry) and chest tightness. Negative for shortness of breath and wheezing.   Cardiovascular: Negative for chest pain.  Gastrointestinal: Negative for nausea, vomiting and diarrhea.  Musculoskeletal: Negative for neck pain.  Neurological: Negative for syncope and headaches.  All other systems reviewed and are negative.    Past Medical History  Diagnosis Date  . GERD (gastroesophageal reflux disease)   . MVP (mitral valve prolapse)     history of   . Hyperlipidemia   . Abnormal glucose   . Tobacco abuse     tried Chantix- caused agression  . Osteoporosis   . Anxiety   . Esophageal stricture     Dr.Dora Olevia Perches  . History of colonic polyps     History   Social History  . Marital Status: Married    Spouse Name: N/A    Number of Children: N/A  . Years of Education: N/A   Occupational History  . administrative assitant     Social History Main Topics  . Smoking status: Smoker, Current Status Unknown -- 1.50 packs/day for 30 years    Start date: 07/15/1978  . Smokeless tobacco: Not on file  . Alcohol Use: No    . Drug Use: No  . Sexual Activity: Not Currently   Other Topics Concern  . 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 Breast biopsy  2000  . Appendectomy  2008    Family History  Problem Relation Age of Onset  . Hyperlipidemia    . Hypertension Mother   . Alcohol abuse Father   . Cirrhosis Father     deceased form cirrhosis  . Other      Fh of abnormal glucose  . Hypertension Father   . Hypertension Sister   . Hypertension Brother   . Cancer Mother     apendix    Allergies  Allergen Reactions  . Penicillins Other (See Comments)    whelps, ,skin hot to touch    Current Outpatient Prescriptions on File Prior to Visit  Medication Sig Dispense Refill  . acetaminophen (TYLENOL) 500 MG tablet Take 500 mg by mouth every 6 (six) hours as needed for mild pain.      Marland Kitchen albuterol (PROVENTIL HFA;VENTOLIN HFA) 108 (90 BASE) MCG/ACT inhaler Inhale 2 puffs  into the lungs every 6 (six) hours as needed for wheezing.  6.7 g  5  . citalopram (CELEXA) 20 MG tablet TAKE 1 TABLET BY MOUTH EVERY DAY  90 tablet  1  . diphenoxylate-atropine (LOMOTIL) 2.5-0.025 MG per tablet Take 2 tablets by mouth 4 (four) times daily as needed for diarrhea or loose stools.  30 tablet  0  . estradiol (VIVELLE-DOT) 0.075 MG/24HR PLACE 1 PATCH ONTO THE SKIN 2 (TWO) TIMES A WEEK.  8 patch  2  . mometasone (NASONEX) 50 MCG/ACT nasal spray Place 2 sprays into the nose daily.      . mometasone-formoterol (DULERA) 200-5 MCG/ACT AERO Inhale 2 puffs into the lungs 2 (two) times daily.  8.8 g  1  . NEXIUM 40 MG capsule TAKE ONE CAPSULE BY MOUTH TWICE A DAY  60 capsule  5  . ondansetron (ZOFRAN ODT) 8 MG disintegrating tablet Take 1 tablet (8  mg total) by mouth every 8 (eight) hours as needed for nausea or vomiting.  20 tablet  0  . ciprofloxacin-dexamethasone (CIPRODEX) otic suspension Place 4 drops into the right ear 2 (two) times daily.  7.5 mL  0  . [DISCONTINUED] promethazine (PHENERGAN) 25 MG tablet Take 1 tablet (25 mg total) by mouth every 6 (six) hours as needed for nausea or vomiting.  15 tablet  0   No current facility-administered medications on file prior to visit.    EXAM: BP 120/84  Pulse 95  Temp(Src) 98.6 F (37 C) (Oral)  Resp 18  Wt 203 lb (92.08 kg)  SpO2 95%      Objective:   Physical Exam  Nursing note and vitals reviewed. Constitutional: She is oriented to person, place, and time. She appears well-developed and well-nourished. No distress.  HENT:  Head: Normocephalic and atraumatic.  Right Ear: External ear normal.  Left Ear: External ear normal.  Nose: Nose normal.  Mouth/Throat: No oropharyngeal exudate.  Oropharynx is slightly erythematous, no exudate. Bilateral TMs normal. Bilateral frontal and maxillary sinuses vaguely mild TTP.  Eyes: Conjunctivae and EOM are normal. Pupils are equal, round, and reactive to light.  Neck: Normal range of motion. Neck supple.  Cardiovascular: Normal rate, regular rhythm and intact distal pulses.   Pulmonary/Chest: Effort normal. No stridor. No respiratory distress. She has wheezes. She has no rales. She exhibits no tenderness.  Lymphadenopathy:    She has no cervical adenopathy.  Neurological: She is alert and oriented to person, place, and time.  Skin: Skin is warm and dry. No rash noted. She is not diaphoretic. No erythema. No pallor.  Psychiatric: She has a normal mood and affect. Her behavior is normal. Judgment and thought content normal.    Lab Results  Component Value Date   WBC 9.7 09/28/2013   HGB 15.3* 09/28/2013   HCT 45.7 09/28/2013   PLT 198 09/28/2013   GLUCOSE 99 09/28/2013   CHOL 206* 09/21/2010   TRIG 216* 09/21/2010   HDL 40 09/21/2010     LDLCALC 123* 09/21/2010   ALT 14 09/28/2013   AST 13 09/28/2013   NA 138 09/28/2013   K 4.5 09/28/2013   CL 100 09/28/2013   CREATININE 0.50 09/28/2013   BUN 9 09/28/2013   CO2 22 09/28/2013   TSH 0.54 08/17/2012         Assessment & Plan:  [name] [name] was seen today for sinusitis.  Diagnoses and associated orders for this visit:  Wheezing-associated respiratory infection Comments: Will use prednisone taper for wheezing symptoms.  Add OTC mucinex, nasal steroid, antihistamine, rest, push fluids. - predniSONE (DELTASONE) 10 MG tablet; 3 tabs once daily for 3 days, then 2 tabs once daily for 3 days, then 1 tab once daily for 3 days - albuterol (PROVENTIL HFA;VENTOLIN HFA) 108 (90 BASE) MCG/ACT inhaler; Inhale 2 puffs into the lungs every 6 (six) hours as needed for wheezing.   Pt also needed proventil inhaler refill.  Return precautions provided, and patient handout on bronchospasm.  Plan to follow up as needed, or for worsening or persistent symptoms despite treatment.  Patient Instructions  Prednisone taper as directed, take with food to prevent nausea.  Plain Over the Counter Mucinex (NOT Mucinex D) for thick secretions. You can alternatively use Mucinex DM (again not D) to also help cough symptoms.  Force NON dairy fluids, drinking plenty of water is best.    Over the Counter Flonase OR Nasacort AQ 1 spray in each nostril twice a day as needed. Use the "crossover" technique into opposite nostril spraying toward opposite ear @ 45 degree angle, not straight up into nostril.   Plain Over the Counter Allegra (NOT D )  160 daily , OR Loratidine 10 mg , OR Zyrtec 10 mg @ bedtime  as needed for itchy eyes & sneezing.  Saline Irrigation and Saline Sprays can also help reduce symptoms.  If emergency symptoms discussed during visit developed, seek medical attention immediately.  Followup as needed, or for worsening or persistent symptoms despite treatment.

## 2014-02-23 NOTE — Patient Instructions (Addendum)
Prednisone taper as directed, take with food to prevent nausea.  Plain Over the Counter Mucinex (NOT Mucinex D) for thick secretions. You can alternatively use Mucinex DM (again not D) to also help cough symptoms.  Force NON dairy fluids, drinking plenty of water is best.    Over the Counter Flonase OR Nasacort AQ 1 spray in each nostril twice a day as needed. Use the "crossover" technique into opposite nostril spraying toward opposite ear @ 45 degree angle, not straight up into nostril.   Plain Over the Counter Allegra (NOT D )  160 daily , OR Loratidine 10 mg , OR Zyrtec 10 mg @ bedtime  as needed for itchy eyes & sneezing.  Saline Irrigation and Saline Sprays can also help reduce symptoms.  If emergency symptoms discussed during visit developed, seek medical attention immediately.  Followup as needed, or for worsening or persistent symptoms despite treatment.     Bronchospasm A bronchospasm is when the tubes that carry air in and out of your lungs (airways) spasm or tighten. During a bronchospasm it is hard to breathe. This is because the airways get smaller. A bronchospasm can be triggered by:  Allergies. These may be to animals, pollen, food, or mold.  Infection. This is a common cause of bronchospasm.  Exercise.  Irritants. These include pollution, cigarette smoke, strong odors, aerosol sprays, and paint fumes.  Weather changes.  Stress.  Being emotional. HOME CARE   Always have a plan for getting help. Know when to call your doctor and local emergency services (911 in the [place] Know where you can get emergency care.  Only take medicines as told by your doctor.  If you were prescribed an inhaler or nebulizer machine, ask your doctor how to use it correctly. Always use a spacer with your inhaler if you were given one.  Stay calm during an attack. Try to relax and breathe more slowly.  Control your home environment:  Change your heating and air conditioning filter  at least once a month.  Limit your use of fireplaces and wood stoves.  Do not  smoke. Do not  allow smoking in your home.  Avoid perfumes and fragrances.  Get rid of pests (such as roaches and mice) and their droppings.  Throw away plants if you see mold on them.  Keep your house clean and dust free.  Replace carpet with wood, tile, or vinyl flooring. Carpet can trap dander and dust.  Use allergy-proof pillows, mattress covers, and box spring covers.  Wash bed sheets and blankets every week in hot water. Dry them in a dryer.  Use blankets that are made of polyester or cotton.  Wash hands frequently. GET HELP IF:  You have muscle aches.  You have chest pain.  The thick spit you spit or cough up (sputum) changes from clear or white to yellow, green, gray, or bloody.  The thick spit you spit or cough up gets thicker.  There are problems that may be related to the medicine you are given such as:  A rash.  Itching.  Swelling.  Trouble breathing. GET HELP RIGHT AWAY IF:  You feel you cannot breathe or catch your breath.  You cannot stop coughing.  Your treatment is not helping you breathe better.  You have very bad chest pain. MAKE SURE YOU:   Understand these instructions.  Will watch your condition.  Will get help right away if you are not doing well or get worse. Document Released: 04/28/2009 Document Revised:  07/06/2013 Document Reviewed: 12/22/2012 [employer] Patient Information 2015 [place], [place]. This information is not intended to replace advice given to you by your health care provider. Make sure you discuss any questions you have with your health care provider.

## 2014-02-23 NOTE — Progress Notes (Signed)
Pre visit review using our clinic review tool, if applicable. No additional management support is needed unless otherwise documented below in the visit note. 

## 2014-03-13 ENCOUNTER — Other Ambulatory Visit: Payer: Self-pay | Admitting: Internal Medicine

## 2014-04-20 ENCOUNTER — Encounter: Payer: Self-pay | Admitting: Internal Medicine

## 2014-04-29 ENCOUNTER — Other Ambulatory Visit: Payer: Self-pay

## 2014-06-27 ENCOUNTER — Other Ambulatory Visit: Payer: Self-pay | Admitting: Internal Medicine

## 2014-07-28 ENCOUNTER — Other Ambulatory Visit: Payer: Self-pay | Admitting: Internal Medicine

## 2014-07-30 ENCOUNTER — Other Ambulatory Visit: Payer: Self-pay | Admitting: Internal Medicine

## 2014-08-03 ENCOUNTER — Other Ambulatory Visit: Payer: Self-pay | Admitting: *Deleted

## 2014-08-03 DIAGNOSIS — J988 Other specified respiratory disorders: Secondary | ICD-10-CM

## 2014-08-03 MED ORDER — ALBUTEROL SULFATE HFA 108 (90 BASE) MCG/ACT IN AERS
2.0000 | INHALATION_SPRAY | Freq: Four times a day (QID) | RESPIRATORY_TRACT | Status: DC | PRN
Start: 2014-08-03 — End: 2016-02-27

## 2014-09-03 ENCOUNTER — Other Ambulatory Visit: Payer: Self-pay | Admitting: Internal Medicine

## 2014-09-22 ENCOUNTER — Other Ambulatory Visit: Payer: Self-pay | Admitting: Obstetrics and Gynecology

## 2014-09-23 LAB — CYTOLOGY - PAP

## 2014-10-05 LAB — HEMOGLOBIN A1C: Hemoglobin A1C: 5.7

## 2014-10-05 LAB — LIPID PANEL
Cholesterol: 230 — AB (ref 0–200)
HDL: 37 (ref 35–70)
LDL Cholesterol: 165
Triglycerides: 140 (ref 40–160)

## 2014-10-05 LAB — TSH: TSH: 1.64 (ref 0.41–5.90)

## 2015-01-02 ENCOUNTER — Other Ambulatory Visit: Payer: Self-pay | Admitting: Internal Medicine

## 2015-01-13 ENCOUNTER — Ambulatory Visit (INDEPENDENT_AMBULATORY_CARE_PROVIDER_SITE_OTHER): Payer: BLUE CROSS/BLUE SHIELD | Admitting: Family Medicine

## 2015-01-13 ENCOUNTER — Ambulatory Visit: Payer: Self-pay | Admitting: Family Medicine

## 2015-01-13 ENCOUNTER — Encounter: Payer: Self-pay | Admitting: Family Medicine

## 2015-01-13 VITALS — BP 124/76 | HR 86 | Temp 98.6°F | Ht 65.0 in | Wt 212.1 lb

## 2015-01-13 DIAGNOSIS — F411 Generalized anxiety disorder: Secondary | ICD-10-CM | POA: Diagnosis not present

## 2015-01-13 DIAGNOSIS — G4700 Insomnia, unspecified: Secondary | ICD-10-CM

## 2015-01-13 DIAGNOSIS — F17200 Nicotine dependence, unspecified, uncomplicated: Secondary | ICD-10-CM

## 2015-01-13 DIAGNOSIS — K219 Gastro-esophageal reflux disease without esophagitis: Secondary | ICD-10-CM | POA: Diagnosis not present

## 2015-01-13 DIAGNOSIS — R0600 Dyspnea, unspecified: Secondary | ICD-10-CM

## 2015-01-13 DIAGNOSIS — R7303 Prediabetes: Secondary | ICD-10-CM

## 2015-01-13 DIAGNOSIS — Z720 Tobacco use: Secondary | ICD-10-CM

## 2015-01-13 DIAGNOSIS — E785 Hyperlipidemia, unspecified: Secondary | ICD-10-CM

## 2015-01-13 DIAGNOSIS — R0683 Snoring: Secondary | ICD-10-CM

## 2015-01-13 DIAGNOSIS — R7309 Other abnormal glucose: Secondary | ICD-10-CM

## 2015-01-13 MED ORDER — ESOMEPRAZOLE MAGNESIUM 40 MG PO CPDR: 40.0000 mg | DELAYED_RELEASE_CAPSULE | Freq: Two times a day (BID) | ORAL | Status: DC

## 2015-01-13 MED ORDER — MOMETASONE FURO-FORMOTEROL FUM 200-5 MCG/ACT IN AERO: 2.0000 | INHALATION_SPRAY | Freq: Two times a day (BID) | RESPIRATORY_TRACT | Status: DC

## 2015-01-13 MED ORDER — MOMETASONE FUROATE 50 MCG/ACT NA SUSP: 2.0000 | Freq: Every day | NASAL | Status: DC

## 2015-01-13 MED ORDER — CITALOPRAM HYDROBROMIDE 20 MG PO TABS: 20.0000 mg | ORAL_TABLET | Freq: Every day | ORAL | Status: DC

## 2015-01-13 NOTE — Addendum Note (Signed)
Addended by: Elmer Picker on: 01/13/2015 04:41 PM   Modules accepted: Orders

## 2015-01-13 NOTE — Patient Instructions (Addendum)
BEFORE YOU LEAVE: -labs -follow up with Dr. Shawna Orleans in 3 months  Please quit smoking  Nasal spray daily for the cough for 1 month and continue if helps  We placed a referral for you as discussed for the lung cancer screening and to the sleep specialists. It usually takes about 1-2 weeks to process and schedule this referral. If you have not heard from [place] regarding this appointment in 2 weeks please contact our office.  We recommend the following healthy lifestyle measures: - eat a healthy diet consisting of lots of vegetables, fruits, beans, nuts, seeds, healthy meats such as white chicken and fish and whole grains.  - avoid fried foods, fast food, processed foods, sodas, red meet and other fattening foods.  - get a least 150 minutes of aerobic exercise per week.    We have ordered labs or studies at this visit. It can take up to 1-2 weeks for results and processing. We will contact you with instructions IF your results are abnormal. Normal results will be released to your [facility]. If you have not heard from [place] or can not find your results in [facility] in 2 weeks please contact our office.

## 2015-01-13 NOTE — Progress Notes (Signed)
HPI:   Acute visit for Med-check  -PCP is Dr. Shawna Orleans  Refractory GERD: -hx esophageal stricture and hiatal hernia -meds: nexium 40bid - symptoms if not on this, sees Dr. Olevia Perches -denies: dysphagia  Anxiety: -meds: celexa 20mg  daily -reports: doing well, has been missing some doses and is running out -denies: depression, panic, side effects  Hot Flashes: -meds: vivelle dot -reports: sees gyn for this -denies: symptoms with this  HLD/Prediabtes: -reports checked this with gyn in march and wast told to follow up with PCP  Tobacco use: -ppd for 40 years -interested in lung cancer screening  Sleep issues: -snores, legs restless at night, sleeps poorly, sleeps in her sleep, wakes herself up with apneic spells -wants to see sleep specialist as reports thinks sleep study she had a few years ago was wrong  Bronchits: -reports chronic -smoker -takes dulera and albuterol  HM: -mammo: [facility]   ROS: See pertinent positives and negatives per HPI.  Past Medical History  Diagnosis Date  . GERD (gastroesophageal reflux disease)   . MVP (mitral valve prolapse)     history of   . Hyperlipidemia   . Abnormal glucose   . Tobacco abuse     tried Chantix- caused agression  . Osteoporosis   . Anxiety   . Esophageal stricture     Dr.Dora Olevia Perches  . History of colonic polyps     Past Surgical History  Procedure Laterality Date  . Breast biopsy  2000  . Appendectomy  2008    Family History  Problem Relation Age of Onset  . Hyperlipidemia    . Hypertension Mother   . Alcohol abuse Father   . Cirrhosis Father     deceased form cirrhosis  . Other      Fh of abnormal glucose  . Hypertension Father   . Hypertension Sister   . Hypertension Brother   . Cancer Mother     apendix    History   Social History  . Marital Status: Married    Spouse Name: N/A  . Number of Children: N/A  . Years of Education: N/A   Occupational History  .  administrative assitant     Social History Main Topics  . Smoking status: Smoker, Current Status Unknown -- 1.50 packs/day for 30 years    Start date: 07/15/1978  . Smokeless tobacco: Not on file  . Alcohol Use: No  . Drug Use: No  . Sexual Activity: Not Currently   Other Topics Concern  . 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  albuterol (PROVENTIL HFA;VENTOLIN HFA) 108 (90 BASE) MCG/ACT inhaler, Inhale 2 puffs into the lungs every 6 (six) hours as needed for wheezing., Disp: 6.7 g, Rfl: 3 .  citalopram (CELEXA) 20 MG tablet, Take 1 tablet (20 mg total) by mouth daily., Disp: 90 tablet, Rfl: 0 .  esomeprazole (NEXIUM) 40 MG capsule, Take 1 capsule (40 mg total) by mouth 2 (two) times daily., Disp: 60 capsule, Rfl: 5 .  Estradiol (MINIVELLE TD), Place onto the skin., Disp: , Rfl:  .  mometasone-formoterol (DULERA) 200-5 MCG/ACT AERO, Inhale 2 puffs  into the lungs 2 (two) times daily., Disp: 8.8 g, Rfl: 1 .  mometasone (NASONEX) 50 MCG/ACT nasal spray, Place 2 sprays into the nose daily., Disp: 17 g, Rfl: 6 .  [DISCONTINUED] promethazine (PHENERGAN) 25 MG tablet, Take 1 tablet (25 mg total) by mouth every 6 (six) hours as needed for nausea or vomiting., Disp: 15 tablet, Rfl: 0  EXAM:  Filed Vitals:   01/13/15 1546  BP: 124/76  Pulse: 86  Temp: 98.6 F (37 C)    Body mass index is 35.3 kg/(m^2).  GENERAL: vitals reviewed and listed above, alert, oriented, appears well hydrated and in no acute distress  HEENT: atraumatic, conjunttiva clear, no obvious abnormalities on inspection of external nose and ears  NECK: no obvious masses on inspection  LUNGS: clear to auscultation bilaterally, no wheezes, rales or rhonchi,  good air movement  CV: HRRR, no peripheral edema  MS: moves all extremities without noticeable abnormality  PSYCH: pleasant and cooperative, no obvious depression or anxiety  ASSESSMENT AND PLAN:  Discussed the following assessment and plan:  Hyperlipemia - Plan: Lipid Panel  TOBACCO ABUSE - Plan: CT CHEST LUNG CA SCREEN LOW DOSE W/O CM -advised to quit smoking -she reports can't - has tried -might try Dr. Sheralyn Boatman for hypnotherapy  Anxiety state  Gastroesophageal reflux disease, esophagitis presence not specified  Prediabetes - Plan: Hemoglobin O9G, Basic metabolic panel  Insomnia - Plan: Ambulatory referral to Neurology  Snoring - Plan: Ambulatory referral to Neurology  -basic FASTING labs   -Patient advised to return or notify a doctor immediately if symptoms worsen or persist or new concerns arise.  Patient Instructions  BEFORE YOU LEAVE: -labs -follow up with Dr. Shawna Orleans in 3 months  Please quit smoking  Nasal spray daily for the cough for 1 month and continue if helps  We placed a referral for you as discussed for the lung cancer screening and to the sleep specialists. It usually takes about 1-2 weeks to process and schedule this referral. If you have not heard from [place] regarding this appointment in 2 weeks please contact our office.  -We have ordered labs or studies at this visit. It can take up to 1-2 weeks for results and processing. We will contact you with instructions IF your results are abnormal. Normal results will be released to your [facility]. If you have not heard from [place] or can not find your results in [facility] in 2 weeks please contact our office.             Colin Benton R.

## 2015-01-13 NOTE — Addendum Note (Signed)
Addended by: Elmer Picker on: 01/13/2015 04:39 PM   Modules accepted: Orders

## 2015-01-13 NOTE — Progress Notes (Signed)
Pre visit review using our clinic review tool, if applicable. No additional management support is needed unless otherwise documented below in the visit note. 

## 2015-01-14 LAB — BASIC METABOLIC PANEL
BUN: 10 mg/dL (ref 6–23)
CO2: 29 mEq/L (ref 19–32)
Calcium: 9.3 mg/dL (ref 8.4–10.5)
Chloride: 102 mEq/L (ref 96–112)
Creat: 0.63 mg/dL (ref 0.50–1.10)
Glucose, Bld: 86 mg/dL (ref 70–99)
Potassium: 5.1 mEq/L (ref 3.5–5.3)
SODIUM: 142 meq/L (ref 135–145)

## 2015-01-14 LAB — LIPID PANEL
CHOL/HDL RATIO: 7.3 ratio
Cholesterol: 220 mg/dL — ABNORMAL HIGH (ref 0–200)
HDL: 30 mg/dL — AB (ref >=46)
LDL Cholesterol: 139 mg/dL — ABNORMAL HIGH (ref 0–99)
Triglycerides: 256 mg/dL — ABNORMAL HIGH (ref <150)
VLDL: 51 mg/dL — ABNORMAL HIGH (ref 0–40)

## 2015-01-14 LAB — HEMOGLOBIN A1C
Hgb A1c MFr Bld: 5.9 % — ABNORMAL HIGH (ref <5.7)
Mean Plasma Glucose: 123 mg/dL — ABNORMAL HIGH (ref <117)

## 2015-01-17 ENCOUNTER — Telehealth: Payer: Self-pay | Admitting: Acute Care

## 2015-01-17 ENCOUNTER — Encounter: Payer: Self-pay | Admitting: Acute Care

## 2015-01-17 NOTE — Telephone Encounter (Signed)
I left a message on the home phone requesting that [name] [name] call me to schedule a screening at the request of Dr. Colin Benton , the patient's PCP. I left my contact information along with the request that she call to schedule the appointment. I will await her return call.

## 2015-01-18 ENCOUNTER — Telehealth: Payer: Self-pay | Admitting: Acute Care

## 2015-01-18 NOTE — Telephone Encounter (Signed)
I called [name] [name] regarding the request from Colin Benton, DO to set her up fro Lung Cancer Screening. [name] [name] did answer the phone, but had company and stated she will call me back. She stated she has my contact information. I will await her return call.

## 2015-01-25 ENCOUNTER — Other Ambulatory Visit: Payer: Self-pay | Admitting: *Deleted

## 2015-01-25 DIAGNOSIS — K219 Gastro-esophageal reflux disease without esophagitis: Secondary | ICD-10-CM

## 2015-01-25 MED ORDER — ESOMEPRAZOLE MAGNESIUM 40 MG PO CPDR: 40.0000 mg | DELAYED_RELEASE_CAPSULE | Freq: Two times a day (BID) | ORAL | Status: DC

## 2015-01-25 NOTE — Telephone Encounter (Signed)
Rx done. 

## 2015-02-06 ENCOUNTER — Ambulatory Visit (INDEPENDENT_AMBULATORY_CARE_PROVIDER_SITE_OTHER): Payer: BLUE CROSS/BLUE SHIELD | Admitting: Internal Medicine

## 2015-02-06 ENCOUNTER — Encounter: Payer: Self-pay | Admitting: Internal Medicine

## 2015-02-06 VITALS — BP 138/76 | HR 86 | Temp 98.3°F | Resp 18 | Ht 65.0 in | Wt 214.8 lb

## 2015-02-06 DIAGNOSIS — E785 Hyperlipidemia, unspecified: Secondary | ICD-10-CM | POA: Diagnosis not present

## 2015-02-06 DIAGNOSIS — R7309 Other abnormal glucose: Secondary | ICD-10-CM

## 2015-02-06 DIAGNOSIS — J069 Acute upper respiratory infection, unspecified: Secondary | ICD-10-CM

## 2015-02-06 MED ORDER — PRAVASTATIN SODIUM 40 MG PO TABS: 40.0000 mg | ORAL_TABLET | Freq: Every day | ORAL | Status: DC

## 2015-02-06 MED ORDER — PREDNISONE 10 MG PO TABS: ORAL_TABLET | ORAL | Status: DC

## 2015-02-06 NOTE — Progress Notes (Signed)
Subjective:    Patient ID: [name] [name], female    DOB: 25-May-1956, 59 y.o.   MRN: [id number]  HPI  59 year old white female with history of chronic tobacco use, abnormal glucose and hyperlipidemia for follow-up. Patient reports persistent cough after suffering with upper respiratory infection one week ago. Patient has associated full sensation in both ears and mild sore throat. She denies fever or chills. She denies shortness of breath. She has intermittent cough that is nonproductive.  Recent blood work reviewed with patient. Her A1c was slightly elevated at 5.9. Her lipid panel was also reviewed. She continues to smoke 1 pack per day. She is not interested in smoking cessation at this time.  Her current dietary habits reviewed in detail.  Review of Systems Negative for fever or chills, intermittent cough    Past Medical History  Diagnosis Date  . GERD (gastroesophageal reflux disease)   . MVP (mitral valve prolapse)     history of   . Hyperlipidemia   . Abnormal glucose   . Tobacco abuse     tried Chantix- caused agression  . Osteoporosis   . Anxiety   . Esophageal stricture     Dr.Dora Olevia Perches  . History of colonic polyps     History   Social History  . Marital Status: Married    Spouse Name: N/A  . Number of Children: N/A  . Years of Education: N/A   Occupational History  . administrative assitant     Social History Main Topics  . Smoking status: Smoker, Current Status Unknown -- 1.50 packs/day for 36 years    Start date: 07/15/1978  . Smokeless tobacco: Not on file  . Alcohol Use: No  . Drug Use: No  . Sexual Activity: Not Currently   Other Topics Concern  . 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 Breast biopsy  2000  . Appendectomy  2008    Family History  Problem Relation Age of Onset  . Hyperlipidemia    . Hypertension Mother   . Alcohol abuse Father   . Cirrhosis Father     deceased form cirrhosis  . Other      Fh of abnormal glucose  . Hypertension Father   . Hypertension Sister   . Hypertension Brother   . Cancer Mother     apendix    Allergies  Allergen Reactions  . Penicillins Other (See Comments)    whelps, ,skin hot to touch    Current Outpatient Prescriptions on File Prior to Visit  Medication Sig Dispense Refill  . albuterol (PROVENTIL HFA;VENTOLIN HFA) 108 (90 BASE) MCG/ACT inhaler Inhale 2 puffs into the lungs every 6 (six) hours as needed for wheezing. 6.7 g 3  . citalopram (CELEXA) 20 MG tablet Take 1 tablet (20 mg total) by mouth daily. 90 tablet 0  . esomeprazole (NEXIUM) 40 MG capsule Take 1 capsule (40 mg total) by mouth 2 (two) times daily. 180 capsule 0  . Estradiol (MINIVELLE TD) Place onto the skin.    . mometasone (NASONEX) 50 MCG/ACT nasal spray Place 2 sprays into the nose  daily. 17 g 6  . mometasone-formoterol (DULERA) 200-5 MCG/ACT AERO Inhale 2 puffs into the lungs 2 (two) times daily. (Patient not taking: Reported on 02/06/2015) 8.8 g 1  . [DISCONTINUED] promethazine (PHENERGAN) 25 MG tablet Take 1 tablet (25 mg total) by mouth every 6 (six) hours as needed for nausea or vomiting. 15 tablet 0   No current facility-administered medications on file prior to visit.    BP 138/76 mmHg  Pulse 86  Temp(Src) 98.3 F (36.8 C) (Oral)  Resp 18  Ht 5\' 5"  (1.651 m)  Wt 214 lb 12.8 oz (97.433 kg)  BMI 35.74 kg/m2  SpO2 92%    Objective:   Physical Exam  Constitutional: She is oriented to person, place, and time. She appears well-developed and well-nourished. No distress.  HENT:  Head: Normocephalic and atraumatic.  Right Ear: External ear normal.  Left Ear: External ear normal.  Mouth/Throat: Oropharynx  is clear and moist.  Neck: Neck supple.  Cardiovascular: Normal rate, regular rhythm and normal heart sounds.   Pulmonary/Chest: Effort normal.  Faint expiratory wheeze left mid and upper lung field  Musculoskeletal: She exhibits no edema.  Lymphadenopathy:    She has no cervical adenopathy.  Neurological: She is alert and oriented to person, place, and time. No cranial nerve deficit.  Skin: Skin is warm and dry.  Psychiatric: She has a normal mood and affect. Her behavior is normal.          Assessment & Plan:

## 2015-02-06 NOTE — Patient Instructions (Signed)
Reduce your intake of sweets and starchy foods Avoid sugary beverages Please complete the following lab tests before your next follow up appointment: BMET, A1c - 790.29 FLP, LFTs - 272.4 Please contact our office if your cough does not improve or gets worse.

## 2015-02-06 NOTE — Assessment & Plan Note (Addendum)
59 year old smoker complains with intermittent cough after probable viral upper respiratory infection one week ago. Patient likely has transient reactive airway disease.  Treat with prednisone taper.  If persistent symptoms, we discussed obtaining CXR.  Smoking cessation strongly encouraged.

## 2015-02-06 NOTE — Assessment & Plan Note (Signed)
Patient understands her higher risk for developing type 2 diabetes. Patient elects to make dietary changes for now. Continue to monitor A1c.

## 2015-02-06 NOTE — Assessment & Plan Note (Signed)
Patient's 10 year cardiovascular risk calculated at 11.4%. Discussed pros and cons of statin therapy. Start pravastatin 40 mg once daily. Arrange fasting lipid panel and LFTs in 3 months.  Low saturated fat diet reviewed with patient.

## 2015-02-07 ENCOUNTER — Other Ambulatory Visit: Payer: Self-pay | Admitting: Internal Medicine

## 2015-02-07 ENCOUNTER — Telehealth: Payer: Self-pay | Admitting: Internal Medicine

## 2015-02-07 MED ORDER — PRAVASTATIN SODIUM 40 MG PO TABS
40.0000 mg | ORAL_TABLET | Freq: Every day | ORAL | Status: DC
Start: 2015-02-07 — End: 2015-02-07

## 2015-02-07 MED ORDER — PREDNISONE 10 MG PO TABS: ORAL_TABLET | ORAL | Status: DC

## 2015-02-07 MED ORDER — PRAVASTATIN SODIUM 40 MG PO TABS: 40.0000 mg | ORAL_TABLET | Freq: Every day | ORAL | Status: DC

## 2015-02-07 NOTE — Telephone Encounter (Signed)
Okay to refill prednisone.

## 2015-02-07 NOTE — Telephone Encounter (Signed)
Original rx sent to mail order.  Please cancel if possible.  Sent new rx to [facility].

## 2015-02-07 NOTE — Telephone Encounter (Signed)
Rx sent 

## 2015-02-07 NOTE — Telephone Encounter (Signed)
Pt needs pravastatin and prednisone send to [address]

## 2015-02-09 ENCOUNTER — Telehealth: Payer: Self-pay | Admitting: Acute Care

## 2015-02-09 NOTE — Telephone Encounter (Signed)
I have called and left another message on the answering machine of [name] [name]. I simply asked her to call me back regarding the screening that Dr. Colin Benton had spoken to her about at her check up.I asked her to call me and let me know if she is interested in the screening. I left my contact information.I will await her return call.

## 2015-03-03 ENCOUNTER — Telehealth: Payer: Self-pay | Admitting: Acute Care

## 2015-03-03 NOTE — Telephone Encounter (Signed)
I have called and left another message with my contact information on the answering machine requesting simply that Ms. Foxworth  Return my call.This is the fourth call to Ms. Ellingwood. If I do not have a return call I will let Dr. Maudie Mercury know, and I will have to assume the patient is not interested in the program.

## 2015-03-09 ENCOUNTER — Institutional Professional Consult (permissible substitution): Payer: Self-pay | Admitting: Neurology

## 2015-05-01 ENCOUNTER — Other Ambulatory Visit: Payer: BLUE CROSS/BLUE SHIELD

## 2015-05-08 ENCOUNTER — Ambulatory Visit: Payer: BLUE CROSS/BLUE SHIELD | Admitting: Internal Medicine

## 2015-05-12 NOTE — Telephone Encounter (Signed)
Left Message to make Appointment.   Dear Mrs. [name] [name], We have attempted to call you several times to schedule the lung screening Dr. Colin Benton requested you have performed. We have been unable to contact you by phone. Please call the number below at your earliest convenience so that we can get you scheduled for your screening. We look forward to participating in your care.  Thank you,  The Lung Cancer Screening Program [phone]

## 2015-06-01 ENCOUNTER — Encounter: Payer: Self-pay | Admitting: Family Medicine

## 2015-06-01 ENCOUNTER — Ambulatory Visit (INDEPENDENT_AMBULATORY_CARE_PROVIDER_SITE_OTHER): Payer: BLUE CROSS/BLUE SHIELD | Admitting: Family Medicine

## 2015-06-01 VITALS — BP 118/80 | HR 88 | Temp 97.7°F | Ht 65.0 in | Wt 209.8 lb

## 2015-06-01 DIAGNOSIS — J069 Acute upper respiratory infection, unspecified: Secondary | ICD-10-CM

## 2015-06-01 DIAGNOSIS — Z23 Encounter for immunization: Secondary | ICD-10-CM | POA: Diagnosis not present

## 2015-06-01 MED ORDER — HYDROCODONE-HOMATROPINE 5-1.5 MG/5ML PO SYRP: 5.0000 mL | ORAL_SOLUTION | Freq: Three times a day (TID) | ORAL | Status: DC | PRN

## 2015-06-01 NOTE — Progress Notes (Signed)
HPI:  URI: -started: yesterday -symptoms:nasal congestion, sore throat, PND cough -denies:fever, SOB, NVD, tooth pain -has tried: nothing -sick contacts/travel/risks: denies flu exposure, tick exposure or or Ebola risks -Hx of: allergies, tobacco use  ROS: See pertinent positives and negatives per HPI.  Past Medical History  Diagnosis Date  . GERD (gastroesophageal reflux disease)   . MVP (mitral valve prolapse)     history of   . Hyperlipidemia   . Abnormal glucose   . Tobacco abuse     tried Chantix- caused agression  . Osteoporosis   . Anxiety   . Esophageal stricture     Dr.Dora Olevia Perches  . History of colonic polyps     Past Surgical History  Procedure Laterality Date  . Breast biopsy  2000  . Appendectomy  2008    Family History  Problem Relation Age of Onset  . Hyperlipidemia    . Hypertension Mother   . Alcohol abuse Father   . Cirrhosis Father     deceased form cirrhosis  . Other      Fh of abnormal glucose  . Hypertension Father   . Hypertension Sister   . Hypertension Brother   . Cancer Mother     apendix    Social History   Social History  . Marital Status: Married    Spouse Name: N/A  . Number of Children: N/A  . Years of Education: N/A   Occupational History  . administrative assitant     Social History Main Topics  . Smoking status: Smoker, Current Status Unknown -- 1.50 packs/day for 36 years    Start date: 07/15/1978  . Smokeless tobacco: None  . Alcohol Use: No  . Drug Use: No  . Sexual Activity: Not Currently   Other Topics Concern  . 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  albuterol (PROVENTIL HFA;VENTOLIN HFA) 108 (90 BASE) MCG/ACT inhaler, Inhale 2 puffs  into the lungs every 6 (six) hours as needed for wheezing., Disp: 6.7 g, Rfl: 3 .  citalopram (CELEXA) 20 MG tablet, Take 1 tablet (20 mg total) by mouth daily., Disp: 90 tablet, Rfl: 0 .  esomeprazole (NEXIUM) 40 MG capsule, Take 1 capsule (40 mg total) by mouth 2 (two) times daily., Disp: 180 capsule, Rfl: 0 .  Estradiol (MINIVELLE TD), Place onto the skin., Disp: , Rfl:  .  mometasone (NASONEX) 50 MCG/ACT nasal spray, Place 2 sprays into the nose daily., Disp: 17 g, Rfl: 6 .  pravastatin (PRAVACHOL) 40 MG tablet, Take 1 tablet (40 mg total) by mouth daily., Disp: 90 tablet, Rfl: 1 .  HYDROcodone-homatropine (HYCODAN) 5-1.5 MG/5ML syrup, Take 5 mLs by mouth every 8 (eight) hours as needed for cough., Disp: 120 mL, Rfl: 0 .  [DISCONTINUED] promethazine (PHENERGAN) 25 MG tablet, Take 1 tablet (25 mg total) by mouth every 6 (six) hours as needed for nausea or vomiting., Disp: 15 tablet, Rfl: 0  EXAM:  Filed Vitals:   06/01/15 1444  BP: 118/80  Pulse: 88  Temp: 97.7 F (36.5 C)    Body mass index is 34.91 kg/(m^2).  GENERAL: vitals reviewed and listed above, alert,  oriented, appears well hydrated and in no acute distress  HEENT: atraumatic, conjunttiva clear, no obvious abnormalities on inspection of external nose and ears, normal appearance of ear canals and TMs, clear nasal congestion, mild post oropharyngeal erythema with PND, no tonsillar edema or exudate, no sinus TTP  NECK: no obvious masses on inspection  LUNGS: clear to auscultation bilaterally, no wheezes, rales or rhonchi, good air movement  CV: HRRR, no peripheral edema  MS: moves all extremities without noticeable abnormality  PSYCH: pleasant and cooperative, no obvious depression or anxiety  ASSESSMENT AND PLAN:  Discussed the following assessment and plan:  Acute upper respiratory infection  -given HPI and exam findings today, a serious infection or illness is unlikely. We discussed potential etiologies, with VURI  being most likely, and advised supportive care and monitoring. We discussed treatment side effects, likely course, antibiotic misuse, transmission, and signs of developing a serious illness. -of course, we advised to return or notify a doctor immediately if symptoms worsen or persist or new concerns arise.    Patient Instructions  INSTRUCTIONS FOR UPPER RESPIRATORY INFECTION:  -plenty of rest and fluids  -nasal saline wash 2-3 times daily (use prepackaged nasal saline or bottled/distilled water if making your own)   -can use AFRIN nasal spray for drainage and nasal congestion - but do NOT use longer then 3-4 days  -can use tylenol (in no history of liver disease) or ibuprofen (if no history of kidney disease, bowel bleeding or significant heart disease) as directed for aches and sorethroat  -in the winter time, using a humidifier at night is helpful (please follow cleaning instructions)  -if you are taking a cough medication - use only as directed, may also try a teaspoon of honey to coat the throat and throat lozenges. If given a cough medication with codeine or hydrocodone or other narcotic please be advised that this contains a strong and  potentially addicting medication. Please follow instructions carefully, take as little as possible and only use AS NEEDED for severe cough. Discuss potential side effects with your pharmacy. Please do not drive or operate machinery while taking these types of medications. Please do not take other sedating medications, drugs or alcohol while taking this medication without discussing with your doctor.  -for sore throat, salt water gargles can help  -follow up if you have fevers, facial pain, tooth pain, difficulty breathing or are worsening or symptoms persist longer then expected  Upper Respiratory Infection, Adult An upper respiratory infection (URI) is also known as the common cold. It is often caused by a type of germ (virus). Colds are easily spread  (contagious). You can pass it to others by kissing, coughing, sneezing, or drinking out of the same glass. Usually, you get better in 1 to 3  weeks.  However, the cough can last for even longer. HOME CARE   Only take medicine as told by your doctor. Follow instructions provided above.  Drink enough water and fluids to keep your pee (urine) clear or pale yellow.  Get plenty of rest.  Return to work when your temperature is < 100 for 24 hours or as told by your doctor. You may use a face mask and wash your hands to stop your cold from spreading. GET HELP RIGHT AWAY IF:   After the first few days, you feel you are getting worse.  You have questions about your medicine.  You have chills, shortness of breath, or red spit (mucus).  You have pain in the face  for more then 1-2 days, especially when you bend forward.  You have a fever, puffy (swollen) neck, pain when you swallow, or white spots in the back of your throat.  You have a bad headache, ear pain, sinus pain, or chest pain.  You have a high-pitched whistling sound when you breathe in and out (wheezing).  You cough up blood.  You have sore muscles or a stiff neck. MAKE SURE YOU:   Understand these instructions.  Will watch your condition.  Will get help right away if you are not doing well or get worse. Document Released: 12/18/2007 Document Revised: 09/23/2011 Document Reviewed: 10/06/2013 [facility] Patient Information 2015 [place], [place]. This information is not intended to replace advice given to you by your health care provider. Make sure you discuss any questions you have with your health care provider.      Colin Benton R.

## 2015-06-01 NOTE — Patient Instructions (Signed)

## 2015-06-01 NOTE — Progress Notes (Signed)
Pre visit review using our clinic review tool, if applicable. No additional management support is needed unless otherwise documented below in the visit note. 

## 2015-06-06 ENCOUNTER — Other Ambulatory Visit: Payer: Self-pay | Admitting: Family Medicine

## 2015-07-27 ENCOUNTER — Encounter: Payer: Self-pay | Admitting: Family Medicine

## 2015-07-27 ENCOUNTER — Ambulatory Visit (INDEPENDENT_AMBULATORY_CARE_PROVIDER_SITE_OTHER): Payer: BLUE CROSS/BLUE SHIELD | Admitting: Family Medicine

## 2015-07-27 VITALS — BP 120/90 | HR 83 | Temp 97.5°F | Ht 65.0 in | Wt 210.5 lb

## 2015-07-27 DIAGNOSIS — M7071 Other bursitis of hip, right hip: Secondary | ICD-10-CM

## 2015-07-27 MED ORDER — METHYLPREDNISOLONE ACETATE 80 MG/ML IJ SUSP
80.0000 mg | Freq: Once | INTRAMUSCULAR | Status: AC
  Administered 2015-07-27: 80 mg via INTRA_ARTICULAR

## 2015-07-27 NOTE — Progress Notes (Signed)
   Subjective:    Patient ID: [name] [name], female    DOB: [birth year], 60 y.o.   MRN: [id number]  HPI Acute visit. Patient is seen with right lateral hip pain and right lateral thigh pain. Onset yesterday. Symptoms were fairly severe last night. She describes an achy pain which radiates from the right lateral hip area down somewhat toward the mid thigh. No leg edema. Denies any lower extremity weakness. No rash. Took her husband's Celebrex last night which helped temporarily. No recent injury. She applied ice to the right lateral hip area and that helped somewhat. Denies any low back pain.  Pain has gradually increased through today. No claudication symptoms. She does have a long history of nicotine use.  Past Medical History  Diagnosis Date  . GERD (gastroesophageal reflux disease)   . MVP (mitral valve prolapse)     history of   . Hyperlipidemia   . Abnormal glucose   . Tobacco abuse     tried Chantix- caused agression  . Osteoporosis   . Anxiety   . Esophageal stricture     Dr.Dora Olevia Perches  . History of colonic polyps    Past Surgical History  Procedure Laterality Date  . Breast biopsy  2000  . Appendectomy  2008    reports that she has been smoking.  She started smoking about 37 years ago. She does not have any smokeless tobacco history on file. She reports that she does not drink alcohol or use illicit drugs. family history includes Alcohol abuse in her father; Cancer in her mother; Cirrhosis in her father; Hypertension in her brother, father, mother, and sister. Allergies  Allergen Reactions  . Penicillins Other (See Comments)    whelps, ,skin hot to touch      Review of Systems  Constitutional: Negative for fever and chills.  Gastrointestinal: Negative for abdominal pain.  Musculoskeletal: Negative for back pain.  Skin: Negative for rash.  Neurological: Negative for weakness and numbness.  Hematological: Negative for adenopathy.       Objective:   Physical  Exam  Constitutional: She appears well-developed and well-nourished.  Cardiovascular: Normal rate and regular rhythm.   Pulmonary/Chest: Effort normal and breath sounds normal. No respiratory distress. She has no wheezes. She has no rales.  Musculoskeletal:  Straight leg raise is negative on the right. Full range of motion right hip. She has tenderness over the greater trochanteric bursa on the right side. No leg edema. Calf is nontender palpation. Excellent dorsalis pedis pulses bilaterally.  Skin: No rash noted.          Assessment & Plan:  Right lateral hip and thigh pain. Suspect greater trochanteric bursitis. Cannot rule out component of iliotibial band tendinitis. Doubt sciatica. Nonfocal exam neurologically. Localized tenderness on exam. We discussed risk and benefits of steroid injection including risk of bruising, bleeding, infection and patient consented. Right lateral hip was prepped with Betadine. Using 1-1/2 inch needle injected 1 mL of Depo-Medrol and 1 mL of plain Xylocaine into bursa region. Patient tolerated well.  Try icing tonight in touch base of not improving by next week. She had some leftover Celebrex and will also take 1 daily for the next few days

## 2015-07-27 NOTE — Progress Notes (Signed)
Pre visit review using our clinic review tool, if applicable. No additional management support is needed unless otherwise documented below in the visit note. 

## 2015-07-27 NOTE — Patient Instructions (Signed)
Hip Bursitis Bursitis is a swelling and soreness (inflammation) of a fluid-filled sac (bursa). This sac overlies and protects the joints.  CAUSES   Injury.  Overuse of the muscles surrounding the joint.  Arthritis.  Gout.  Infection.  Cold weather.  Inadequate warm-up and conditioning prior to activities. The cause may not be known.  SYMPTOMS   Mild to severe irritation.  Tenderness and swelling over the outside of the hip.  Pain with motion of the hip.  If the bursa becomes infected, a fever may be present. Redness, tenderness, and warmth will develop over the hip. Symptoms usually lessen in 3 to 4 weeks with treatment, but can come back. TREATMENT If conservative treatment does not work, your caregiver may advise draining the bursa and injecting cortisone into the area. This may speed up the healing process. This may also be used as an initial treatment of choice. HOME CARE INSTRUCTIONS   Apply ice to the affected area for 15-20 minutes every 3 to 4 hours while awake for the first 2 days. Put the ice in a plastic bag and place a towel between the bag of ice and your skin.  Rest the painful joint as much as possible, but continue to put the joint through a normal range of motion at least 4 times per day. When the pain lessens, begin normal, slow movements and usual activities to help prevent stiffness of the hip.  Only take over-the-counter or prescription medicines for pain, discomfort, or fever as directed by your caregiver.  Use crutches to limit weight bearing on the hip joint, if advised.  Elevate your painful hip to reduce swelling. Use pillows for propping and cushioning your legs and hips.  Gentle massage may provide comfort and decrease swelling. SEEK IMMEDIATE MEDICAL CARE IF:   Your pain increases even during treatment, or you are not improving.  You have a fever.  You have heat and inflammation over the involved bursa.  You have any other questions or  concerns. MAKE SURE YOU:   Understand these instructions.  Will watch your condition.  Will get help right away if you are not doing well or get worse.   This information is not intended to replace advice given to you by your health care provider. Make sure you discuss any questions you have with your health care provider.   Document Released: 12/21/2001 Document Revised: 09/23/2011 Document Reviewed: 01/31/2015 Elsevier Interactive Patient Education 2016 [employer]  

## 2015-07-28 ENCOUNTER — Ambulatory Visit: Payer: BLUE CROSS/BLUE SHIELD | Admitting: Internal Medicine

## 2015-08-03 ENCOUNTER — Other Ambulatory Visit: Payer: Self-pay | Admitting: Family Medicine

## 2015-09-08 ENCOUNTER — Encounter: Payer: Self-pay | Admitting: Gastroenterology

## 2015-11-01 ENCOUNTER — Ambulatory Visit: Payer: Worker's Compensation

## 2015-11-01 ENCOUNTER — Ambulatory Visit (INDEPENDENT_AMBULATORY_CARE_PROVIDER_SITE_OTHER): Payer: Worker's Compensation | Admitting: Physician Assistant

## 2015-11-01 VITALS — BP 132/82 | HR 78 | Temp 97.9°F | Resp 16 | Ht 65.0 in | Wt 208.4 lb

## 2015-11-01 DIAGNOSIS — Z026 Encounter for examination for insurance purposes: Secondary | ICD-10-CM | POA: Diagnosis not present

## 2015-11-01 DIAGNOSIS — M25561 Pain in right knee: Secondary | ICD-10-CM | POA: Diagnosis not present

## 2015-11-01 DIAGNOSIS — S93402A Sprain of unspecified ligament of left ankle, initial encounter: Secondary | ICD-10-CM | POA: Diagnosis not present

## 2015-11-01 DIAGNOSIS — L03115 Cellulitis of right lower limb: Secondary | ICD-10-CM

## 2015-11-01 LAB — POCT CBC
GRANULOCYTE PERCENT: 72.4 % (ref 37–80)
HEMATOCRIT: 43 % (ref 37.7–47.9)
HEMOGLOBIN: 15.2 g/dL (ref 12.2–16.2)
Lymph, poc: 2.7 (ref 0.6–3.4)
MCH: 31.1 pg (ref 27–31.2)
MCHC: 35.3 g/dL (ref 31.8–35.4)
MCV: 88.2 fL (ref 80–97)
MID (cbc): 0.6 (ref 0–0.9)
MPV: 7.9 fL (ref 0–99.8)
POC GRANULOCYTE: 8.6 — AB (ref 2–6.9)
POC LYMPH PERCENT: 22.5 %L (ref 10–50)
POC MID %: 5.1 % (ref 0–12)
Platelet Count, POC: 221 10*3/uL (ref 142–424)
RBC: 4.87 M/uL (ref 4.04–5.48)
RDW, POC: 13.9 %
WBC: 11.9 10*3/uL — AB (ref 4.6–10.2)

## 2015-11-01 MED ORDER — IBUPROFEN 600 MG PO TABS: 600.0000 mg | ORAL_TABLET | Freq: Three times a day (TID) | ORAL | Status: DC | PRN

## 2015-11-01 MED ORDER — CLINDAMYCIN HCL 300 MG PO CAPS: 300.0000 mg | ORAL_CAPSULE | Freq: Three times a day (TID) | ORAL | Status: DC

## 2015-11-01 NOTE — Patient Instructions (Addendum)
Do not miss doses of your antibiotic.  Please return tomorrow if your skin becomes increasingly red, you develop fever, chills, nausea, or dizziness.    IF you received an x-ray today, you will receive an invoice from [facility]. Please contact [facility] at [phone] with questions or concerns regarding your invoice.   IF you received labwork today, you will receive an invoice from [employer]. Please contact [facility] at [phone] with questions or concerns regarding your invoice.   Our billing staff will not be able to assist you with questions regarding bills from these companies.  You will be contacted with the lab results as soon as they are available. The fastest way to get your results is to activate your My Chart account. Instructions are located on the last page of this paperwork. If you have not heard from [place] regarding the results in 2 weeks, please contact this office.

## 2015-11-01 NOTE — Progress Notes (Signed)
11/01/2015 3:56 PM   DOB: August 17, 1955 / MRN: [id number]  SUBJECTIVE:  [name] [name] is a 60 y.o. female presenting for a fall that occurred two days ago while at work.  Reports she was stepping down from a curb and twisted her left ankle, and in the process of falling her right knee slammed into the ground causing an abrasion to the anterior knee.  She is able to walk at this time however it is painful.  Complains of left proximal fibular tenderness along with left ankle pain and swelling.    She complains that the wound on her right anterior knee is very tender and seems to be getting worse.  States the pain radiates to the right lateral hip with walking.    She is allergic to penicillins.    Review of Systems  Constitutional: Negative for fever and chills.  Respiratory: Negative for cough.   Cardiovascular: Negative for chest pain.  Gastrointestinal: Negative for nausea and vomiting.  Musculoskeletal: Positive for joint pain and falls. Negative for myalgias, back pain and neck pain.  Neurological: Negative for dizziness and headaches.  d  Problem list and medications reviewed and updated by myself where necessary, and exist elsewhere in the encounter.   OBJECTIVE:  BP 132/82 mmHg  Pulse 78  Temp(Src) 97.9 F (36.6 C) (Oral)  Resp 16  Ht 5\' 5"  (1.651 m)  Wt 208 lb 6.4 oz (94.53 kg)  BMI 34.68 kg/m2  SpO2 95%  Physical Exam  Constitutional: She is oriented to person, place, and time. She appears well-nourished. No distress.  Eyes: EOM are normal. Pupils are equal, round, and reactive to light.  Cardiovascular: Normal rate.   Pulmonary/Chest: Effort normal.  Abdominal: She exhibits no distension.  Musculoskeletal:       Legs: Neurological: She is alert and oriented to person, place, and time. No cranial nerve deficit. Gait normal.  Skin: Skin is dry. She is not diaphoretic.  Psychiatric: She has a normal mood and affect.  Vitals reviewed.   Results for orders placed or  performed in visit on 11/01/15 (from the past 72 hour(s))  POCT CBC     Status: Abnormal   Collection Time: 11/01/15  3:27 PM  Result Value Ref Range   WBC 11.9 (A) 4.6 - 10.2 K/uL   Lymph, poc 2.7 0.6 - 3.4   POC LYMPH PERCENT 22.5 10 - 50 %L   MID (cbc) 0.6 0 - 0.9   POC MID % 5.1 0 - 12 %M   POC Granulocyte 8.6 (A) 2 - 6.9   Granulocyte percent 72.4 37 - 80 %G   RBC 4.87 4.04 - 5.48 M/uL   Hemoglobin 15.2 12.2 - 16.2 g/dL   HCT, POC 43.0 37.7 - 47.9 %   MCV 88.2 80 - 97 fL   MCH, POC 31.1 27 - 31.2 pg   MCHC 35.3 31.8 - 35.4 g/dL   RDW, POC 13.9 %   Platelet Count, POC 221 142 - 424 K/uL   MPV 7.9 0 - 99.8 fL    Dg Tibia/fibula Left  11/01/2015  CLINICAL DATA:  [place] 2 days ago.  Left leg pain. EXAM: LEFT TIBIA AND FIBULA - 2 VIEW COMPARISON:  None. FINDINGS: The knee and ankle joints are maintained. No acute fracture of the tibia or fibula is identified. IMPRESSION: No acute bony findings. Electronically Signed   By: Marijo Sanes M.D.   On: 11/01/2015 15:26   Dg Ankle Complete Left  11/01/2015  CLINICAL DATA:  Injured ankle 2 days ago. EXAM: LEFT ANKLE COMPLETE - 3+ VIEW COMPARISON:  None. FINDINGS: The ankle mortise is maintained. No acute ankle fracture or osteochondral abnormality. No joint effusion. The mid and hindfoot bony structures are intact. A small calcaneal heel spur is noted incidentally. IMPRESSION: No acute ankle fracture. Electronically Signed   By: Marijo Sanes M.D.   On: 11/01/2015 15:25   Dg Knee Complete 4 Views Right  11/01/2015  CLINICAL DATA:  60 year old female with history of right-sided knee pain after fall 2 days ago. EXAM: RIGHT KNEE - COMPLETE 4+ VIEW COMPARISON:  No priors. FINDINGS: Multiple views of the right knee demonstrate no acute displaced fracture, subluxation, dislocation, or soft tissue abnormality. IMPRESSION: No acute radiographic abnormality of the right knee. Electronically Signed   By: Vinnie Langton M.D.   On: 11/01/2015 15:22     ASSESSMENT AND PLAN  [name] [name] was seen today for workers comp injury, ankle pain and knee pain.  Diagnoses and all orders for this visit:  Encounter related to worker's compensation claim  Right knee pain: See problem 3.  -     DG Knee Complete 4 Views Right; Future  Cellulitis of right lower extremity: Advised that if she is worsening tomorrow to return to clinic for re-eval.  I have written her out of work for the next two days, and she is off the weekend.  She is to return to work next Monday with the only precaution being she wear her came walker.  Will follow up on the ankle in one week.   -     POCT CBC -     clindamycin (CLEOCIN) 300 MG capsule; Take 1 capsule (300 mg total) by mouth 3 (three) times daily.  Sprain of ankle, left, initial encounter: Cam walker. Follow up in one week.  -     DG Ankle Complete Left; Future -     DG Tibia/Fibula Left; Future -     ibuprofen (ADVIL,MOTRIN) 600 MG tablet; Take 1 tablet (600 mg total) by mouth every 8 (eight) hours as needed.    The patient was advised to call or return to clinic if she does not see an improvement in symptoms or to seek the care of the closest emergency department if she worsens with the above plan.   Philis Fendt, MHS, PA-C [facility] 11/01/2015 3:56 PM

## 2015-11-08 ENCOUNTER — Ambulatory Visit (INDEPENDENT_AMBULATORY_CARE_PROVIDER_SITE_OTHER): Payer: Worker's Compensation | Admitting: Family Medicine

## 2015-11-08 VITALS — BP 131/80 | HR 76 | Temp 98.4°F | Resp 16 | Ht 63.5 in | Wt 212.0 lb

## 2015-11-08 DIAGNOSIS — S93402A Sprain of unspecified ligament of left ankle, initial encounter: Secondary | ICD-10-CM

## 2015-11-08 DIAGNOSIS — L03115 Cellulitis of right lower limb: Secondary | ICD-10-CM

## 2015-11-08 MED ORDER — MUPIROCIN CALCIUM 2 % EX CREA
1.0000 | TOPICAL_CREAM | Freq: Two times a day (BID) | CUTANEOUS | Status: DC
Start: 2015-11-08 — End: 2016-02-27

## 2015-11-08 NOTE — Patient Instructions (Addendum)
Let me know if you have increasing pain, redness or swelling of the right knee.  Use the ankle brace provided today when out of the house.  Recheck 6-7 days.

## 2015-11-08 NOTE — Progress Notes (Signed)
[name] [name] is a 60 y.o. female presenting for a fall that occurred ten days ago while at work. Reports she was stepping down from a curb and twisted her left ankle, and in the process of falling her right knee slammed into the ground causing an abrasion to the anterior knee. She is able to walk at this time however it is painful. Complains of left proximal fibular tenderness along with left ankle pain and swelling.   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  Able to bear weight on left ankle now.  The right knee is still scabbed and cracking when the knee bends.  Patient is started developing diarrhea on the clindamycin.  Objective:BP 131/80 mmHg  Pulse 76  Temp(Src) 98.4 F (36.9 C)  Resp 16  Ht 5' 3.5" (1.613 m)  Wt 212 lb (96.163 kg)  BMI 36.96 kg/m2 Right knee shows a thick scab that measures 2 x 4 cm and has minimal erythema at the margins. There is no significant oriented. There is no drainage.  Left ankle shows moderate swelling behind the lateral malleolus and there is tenderness anterior to the lateral malleolus. She has good range of motion at this point. There is minimal ecchymosis.  Assessment: I'm concerned about the diarrhea on clindamycin. Therefore asked the patient to stop it.  I think were to point where we can use Bactroban cream without using oral antibiotics. Patient will let me know if things get worse regarding the knee.  Left ankle is much better and I think patient should be bearing weight now with smaller brace, therefore switching to an ASO device.  Plan: Recheck in one week, ASO splint for left ankle, and Bactroban cream for the right knee  Signed, Carola Frost.D.

## 2015-11-14 ENCOUNTER — Encounter: Payer: Self-pay | Admitting: Family Medicine

## 2015-11-14 ENCOUNTER — Ambulatory Visit (INDEPENDENT_AMBULATORY_CARE_PROVIDER_SITE_OTHER): Payer: Worker's Compensation | Admitting: Family Medicine

## 2015-11-14 VITALS — BP 122/80 | HR 87 | Temp 98.0°F | Resp 18 | Ht 63.5 in | Wt 209.4 lb

## 2015-11-14 DIAGNOSIS — L03115 Cellulitis of right lower limb: Secondary | ICD-10-CM

## 2015-11-14 DIAGNOSIS — S93402D Sprain of unspecified ligament of left ankle, subsequent encounter: Secondary | ICD-10-CM | POA: Diagnosis not present

## 2015-11-14 NOTE — Progress Notes (Addendum)
[name] [name] is a 60 y.o. female who presents today for f/u L ankle sprain and R knee prepatellar cellulitis.  L ankle sprain - Improving with ASO.  Denies repeat injury and no paresthesias.  Swelling going down.    R prepatellar cellulitis - No redness, warmth, or TTP at this point.  Denies trouble with ROM and no Fever/chills or sweats.  Past Medical History  Diagnosis Date  . GERD (gastroesophageal reflux disease)   . MVP (mitral valve prolapse)     history of   . Hyperlipidemia   . Abnormal glucose   . Tobacco abuse     tried Chantix- caused agression  . Osteoporosis   . Anxiety   . Esophageal stricture     Dr.Dora Olevia Perches  . History of colonic polyps     History  Smoking status  . Current Every Day Smoker -- 1.50 packs/day for 36 years  . Start date: 07/15/1978  Smokeless tobacco  . Not on file    Family History  Problem Relation Age of Onset  . Hyperlipidemia    . Hypertension Mother   . Alcohol abuse Father   . Cirrhosis Father     deceased form cirrhosis  . Other      Fh of abnormal glucose  . Hypertension Father   . Hypertension Sister   . Hypertension Brother   . Cancer Mother     apendix    Current Outpatient Prescriptions on File Prior to Visit  Medication Sig Dispense Refill  . albuterol (PROVENTIL HFA;VENTOLIN HFA) 108 (90 BASE) MCG/ACT inhaler Inhale 2 puffs into the lungs every 6 (six) hours as needed for wheezing. 6.7 g 3  . citalopram (CELEXA) 20 MG tablet TAKE 1 TABLET (20 MG TOTAL) BY MOUTH DAILY. 90 tablet 1  . esomeprazole (NEXIUM) 40 MG capsule Take 1 capsule (40 mg total) by mouth 2 (two) times daily. 180 capsule 0  . Estradiol (MINIVELLE TD) Place onto the skin. Reported on 11/08/2015    . ibuprofen (ADVIL,MOTRIN) 600 MG tablet Take 1 tablet (600 mg total) by mouth every 8 (eight) hours as needed. 30 tablet 0  . mupirocin cream (BACTROBAN) 2 % Apply 1 application topically 2 (two) times daily. 30 g 0  . NEXIUM 40 MG capsule TAKE 1 CAPSULE  (40 MG TOTAL) BY MOUTH 2 (TWO) TIMES DAILY. 60 capsule 5  . pravastatin (PRAVACHOL) 40 MG tablet Take 1 tablet (40 mg total) by mouth daily. (Patient not taking: Reported on 11/01/2015) 90 tablet 1  . [DISCONTINUED] promethazine (PHENERGAN) 25 MG tablet Take 1 tablet (25 mg total) by mouth every 6 (six) hours as needed for nausea or vomiting. 15 tablet 0   No current facility-administered medications on file prior to visit.    ROS: Per HPI.  All other systems reviewed and are negative.   Physical Exam Filed Vitals:   11/14/15 1547  BP: 122/80  Pulse: 87  Temp: 98 F (36.7 C)  Resp: 18    Physical Examination: NAD L ankle: TTP anterior fib around ATFL.  + Anterior drawer and soft tissue edema.  No distal tip med/lat malleolus or posterior tip TTP.  No Nspot or 5th MT pain.  Neurovascular intact LLE.  R knee - prepatellar region w/o erythema, fluctuance, edema, or ecchymosis.  Resolving cellulitic area w/o induration.     Chemistry      Component Value Date/Time   NA 142 01/13/2015 1641   K 5.1 01/13/2015 1641   CL 102  01/13/2015 1641   CO2 29 01/13/2015 1641   BUN 10 01/13/2015 1641   CREATININE 0.63 01/13/2015 1641   CREATININE 0.50 09/28/2013 1205      Component Value Date/Time   CALCIUM 9.3 01/13/2015 1641   ALKPHOS 106 09/28/2013 1205   AST 13 09/28/2013 1205   ALT 14 09/28/2013 1205   BILITOT 0.4 09/28/2013 1205       Impression/Plan: 1) R knee cellulitis - resolving, continue bactroban 2) L ankle sprain, grade 1 - resolving.  Continue ASO PRN.  Has reached MMI and no PPI.  F/U PRN.

## 2015-11-14 NOTE — Patient Instructions (Signed)
     IF you received an x-ray today, you will receive an invoice from [facility]. Please contact  Radiology at [phone] with questions or concerns regarding your invoice.   IF you received labwork today, you will receive an invoice from [facility]. Please contact [facility] at [phone] with questions or concerns regarding your invoice.   Our billing staff will not be able to assist you with questions regarding bills from these companies.  You will be contacted with the lab results as soon as they are available. The fastest way to get your results is to activate your My Chart account. Instructions are located on the last page of this paperwork. If you have not heard from us regarding the results in 2 weeks, please contact this office.      

## 2015-12-13 ENCOUNTER — Telehealth: Payer: Self-pay

## 2015-12-13 NOTE — Telephone Encounter (Signed)
Esomeprazole Magnesium Capsule- PA approved: effective 11/15/15 through 06/03/2016.

## 2015-12-13 NOTE — Progress Notes (Signed)
This encounter was created in error - please disregard.

## 2015-12-13 NOTE — Progress Notes (Deleted)
Diclofenac Gel 1%- Approved through 07/14/16.

## 2015-12-15 ENCOUNTER — Other Ambulatory Visit: Payer: Self-pay | Admitting: Family Medicine

## 2015-12-21 ENCOUNTER — Other Ambulatory Visit: Payer: Self-pay | Admitting: *Deleted

## 2015-12-21 DIAGNOSIS — K219 Gastro-esophageal reflux disease without esophagitis: Secondary | ICD-10-CM

## 2016-02-27 ENCOUNTER — Encounter: Payer: Self-pay | Admitting: Family Medicine

## 2016-02-27 ENCOUNTER — Ambulatory Visit (INDEPENDENT_AMBULATORY_CARE_PROVIDER_SITE_OTHER): Payer: BLUE CROSS/BLUE SHIELD | Admitting: Family Medicine

## 2016-02-27 VITALS — BP 122/80 | HR 75 | Resp 12 | Ht 63.5 in | Wt 204.5 lb

## 2016-02-27 DIAGNOSIS — J309 Allergic rhinitis, unspecified: Secondary | ICD-10-CM

## 2016-02-27 DIAGNOSIS — F4323 Adjustment disorder with mixed anxiety and depressed mood: Secondary | ICD-10-CM

## 2016-02-27 DIAGNOSIS — M26623 Arthralgia of bilateral temporomandibular joint: Secondary | ICD-10-CM

## 2016-02-27 DIAGNOSIS — J069 Acute upper respiratory infection, unspecified: Secondary | ICD-10-CM | POA: Diagnosis not present

## 2016-02-27 DIAGNOSIS — J4520 Mild intermittent asthma, uncomplicated: Secondary | ICD-10-CM | POA: Diagnosis not present

## 2016-02-27 MED ORDER — MOMETASONE FUROATE 50 MCG/ACT NA SUSP: 2.0000 | Freq: Every day | NASAL | 12 refills | Status: DC

## 2016-02-27 MED ORDER — PREDNISONE 20 MG PO TABS: 40.0000 mg | ORAL_TABLET | Freq: Every day | ORAL | 0 refills | Status: AC

## 2016-02-27 MED ORDER — ALBUTEROL SULFATE HFA 108 (90 BASE) MCG/ACT IN AERS: 2.0000 | INHALATION_SPRAY | Freq: Four times a day (QID) | RESPIRATORY_TRACT | 0 refills | Status: DC | PRN

## 2016-02-27 NOTE — Patient Instructions (Addendum)
  [name] I have seen you today for an acute visit because your primary care provider was not available. Monitor for signs of worsening symptoms and seek immediate medical attention if any concerning/warning symptom as we discussed. If symptoms are not resolved in 1-2 weeks you should schedule a follow up appointment with your doctor, before if symptoms get worse.  A few things to remember from today's visit:   URI (upper respiratory infection)  Bilateral temporomandibular joint pain  Allergic rhinitis, unspecified allergic rhinitis type  Adjustment disorder with mixed anxiety and depressed mood  RAD (reactive airway disease), mild intermittent, uncomplicated - Plan: albuterol (PROVENTIL HFA;VENTOLIN HFA) 108 (90 Base) MCG/ACT inhaler Albuterol inh 2 puff 4 times per day for a week then as needed.   Symptoms could be related to allergies or the beginning OF viral illness. Today I did not find any suggestion of bacterial infection.   Viral infections are self-limited and we treat each symptom depending of severity.  Over the counter medications as decongestants and cold medications usually help, they need to be taken with caution if there is a history of high blood pressure or palpitations. Tylenol and/or Ibuprofen also helps with most symptoms (headache, muscle aching, fever,etc) Plenty of fluids. Honey helps with cough. Steam inhalations helps with runny nose, nasal congestion, and may prevent sinus infections. Cough and nasal congestion could last a few days and sometimes weeks, mainly when smoking. Please follow in not any better in 1-2 weeks or if symptoms get worse.  No changes in rest of medications.   Please be sure medication list is accurate. If a new problem present, please set up appointment sooner than planned today.

## 2016-02-27 NOTE — Progress Notes (Signed)
HPI:  ACUTE VISIT:  Chief Complaint  Patient presents with  . Headache    Patient had to put dog down yesterday. Patient was crying, this morning woke up with headache, ears aching, eyes matted. Patient has used a warm compress this morning to help with her eyes.     Ms.Kalle J Hertling is a 60 y.o. female, who is here today complaining of a day of eye drainage and TMJ pain.  Last night she started with TMJ aching, she has had it intermittently for a while. She denies limitation of TMJ movement, she has not identified exacerbating factors or alleviating factors. Also she is having bilateral ear ache and fullness sensation.  Since yesterday she has been crying a lot because her dog died yesterday, she has history of depression and anxiety, currently she is on Celexa.. She denies any suicidal thoughts.  + Palpebral puffiness and this morning crusty clear mater around eyes. She denies conjunctival erythema, pruritic eyes, or visual changes.  Nonproductive cough for 1-2 weeks.  No fever, chill, or myalgias. Positive for nasal congestion, rhinorrhea,and post nasal drainage. + Scratchy throat.  Denies chest pain or dyspnea. She has had some intermittent wheezing for the past 1-2 weeks, she has prior history and in the past she was prescribed an Albuterol  Inhaler. Denies Hx of asthma or COPD.   No Hx of recent travel. No sick contact. No known insect bite. + Hx of allergies, "sinuses." + Smoker.  OTC medications for this problem: Sudafed 6 days ago, stopped.  Symptoms otherwise stable.    Review of Systems  Constitutional: Positive for fatigue. Negative for activity change, appetite change, fever and unexpected weight change.  HENT: Positive for congestion, ear pain, postnasal drip, rhinorrhea and sinus pressure. Negative for dental problem, ear discharge, facial swelling, hearing loss, mouth sores, sneezing, sore throat, trouble swallowing and voice change.   Eyes:  Positive for discharge and redness. Negative for photophobia, pain, itching and visual disturbance.  Respiratory: Positive for cough. Negative for shortness of breath and wheezing.   Cardiovascular: Negative.   Gastrointestinal: Negative for abdominal pain, nausea and vomiting.       No changes in bowel habits.  Musculoskeletal: Positive for arthralgias. Negative for back pain, joint swelling, myalgias and neck pain.  Skin: Negative for rash.  Allergic/Immunologic: Negative for environmental allergies.  Neurological: Positive for headaches (global HA last night.). Negative for syncope, weakness and numbness.  Hematological: Negative for adenopathy. Does not bruise/bleed easily.  Psychiatric/Behavioral: Positive for sleep disturbance. Negative for confusion. The patient is nervous/anxious.       Current Outpatient Prescriptions on File Prior to Visit  Medication Sig Dispense Refill  . citalopram (CELEXA) 20 MG tablet TAKE 1 TABLET (20 MG TOTAL) BY MOUTH DAILY. 90 tablet 0  . esomeprazole (NEXIUM) 40 MG capsule Take 1 capsule (40 mg total) by mouth 2 (two) times daily. 180 capsule 0  . [DISCONTINUED] promethazine (PHENERGAN) 25 MG tablet Take 1 tablet (25 mg total) by mouth every 6 (six) hours as needed for nausea or vomiting. 15 tablet 0   No current facility-administered medications on file prior to visit.      Past Medical History:  Diagnosis Date  . Abnormal glucose   . Anxiety   . Esophageal stricture    Dr.Dora Olevia Perches  . GERD (gastroesophageal reflux disease)   . History of colonic polyps   . Hyperlipidemia   . MVP (mitral valve prolapse)    history of   .  Osteoporosis   . Tobacco abuse    tried Chantix- caused agression   Allergies  Allergen Reactions  . Penicillins Other (See Comments)    whelps, ,skin hot to touch    Social History   Social History  . Marital status: Married    Spouse name: N/A  . Number of children: N/A  . Years of education: N/A    Occupational History  . administrative assitant     Social History Main Topics  . Smoking status: Current Every Day Smoker    Packs/day: 1.50    Years: 36.00    Start date: 07/15/1978  . Smokeless tobacco: None  . Alcohol use No  . Drug use: No  . Sexual activity: Not Currently   Other Topics Concern  . 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  O2 sat at RA 95%    Physical Exam  Constitutional: She is oriented to person, place, and time. She appears well-developed. She does not appear ill. No distress.  HENT:  Head: Atraumatic.  Right Ear: Hearing, tympanic membrane, external ear and ear canal normal.  Left Ear: Hearing, external ear and ear canal normal. A middle ear effusion (mild) is present.  Nose: Rhinorrhea present. No mucosal edema. Right sinus exhibits no maxillary sinus tenderness and no frontal sinus tenderness. Left sinus exhibits no maxillary sinus tenderness and no frontal sinus tenderness.  Mouth/Throat: Oropharynx is clear and moist and mucous membranes are normal.  Eyes: Conjunctivae and EOM are normal. Pupils are equal, round, and reactive to light. Right eye exhibits no discharge. Left eye exhibits no discharge.  Palpebral bilateral, mild edema, upper and lower eye lid. No erythema or tenderness.  Neck: No muscular tenderness present. No edema and no erythema present.  Cardiovascular: Normal rate and regular rhythm.   No murmur heard. Respiratory: Effort normal and breath sounds normal. No respiratory distress.  Musculoskeletal:  TMJ normal ROM, mild tenderness with pressure, no crepitus.  Lymphadenopathy:       Head (right side): No  submandibular, no preauricular and no posterior auricular adenopathy present.       Head (left side): No submandibular, no preauricular and no posterior auricular adenopathy present.    She has no cervical adenopathy.  Neurological: She is alert and oriented to person, place, and time.  Skin: Skin is warm. No rash noted. No erythema.  Psychiatric: Her speech is normal. Her mood appears anxious. She exhibits a depressed mood. She expresses no suicidal ideation.  Well groomed, good eye contact.      ASSESSMENT AND PLAN:     Anely was seen today for headache.  Diagnoses and all orders for this visit:  RAD (reactive airway disease), mild intermittent, uncomplicated  ? COPD. Today lung auscultation negative, because she is reporting wheezing I recommended albuterol inhaler 4 times a day for a week, and then as needed. Encouraged to quit smoking. Instructed about warning signs.  -     albuterol (PROVENTIL HFA;VENTOLIN HFA) 108 (90 Base) MCG/ACT inhaler; Inhale 2 puffs into the lungs every 6 (  six) hours as needed for wheezing or shortness of breath.  URI (upper respiratory infection)  Some of her symptoms could be the beginning of a viral URI, recommended monitoring for new symptoms. At this time I don't think antibiotic is needed. For now I recommend symptomatic treatment. I explained because smoking, congestion and cough may last a few weeks.  Bilateral temporomandibular joint pain  ? TMJ OA. It seems to be mild, no limitation of ROM. She can try OTC acetaminophen or NSAIDs. Local heat and avoiding foods that she may need to chew for long time/crusty food. F/U as needed.  Allergic rhinitis, unspecified allergic rhinitis type  OTC Allegra 180 mg daily. She was on Nasonex in the past, recommended resuming intranasal spray.  Short course of prednisone may also help, some side effects discussed.   -     predniSONE (DELTASONE) 20 MG tablet; Take 2 tablets (40 mg total) by  mouth daily with breakfast. -     mometasone (NASONEX) 50 MCG/ACT nasal spray; Place 2 sprays into the nose daily.  Adjustment disorder with mixed anxiety and depressed mood  For now no changes in current management.  Instructed about warning signs. Follow-up with PCP.         -Ms.Makaylee Moniz [facility] advised to return or notify a doctor immediately if symptoms worsen or persist or new concerns arise.       Danayah Smyre G. [place], MD  [facility]. [place] office.

## 2016-03-24 ENCOUNTER — Other Ambulatory Visit: Payer: Self-pay | Admitting: Family Medicine

## 2016-04-11 ENCOUNTER — Other Ambulatory Visit: Payer: Self-pay | Admitting: *Deleted

## 2016-04-11 DIAGNOSIS — K219 Gastro-esophageal reflux disease without esophagitis: Secondary | ICD-10-CM

## 2016-04-11 MED ORDER — ESOMEPRAZOLE MAGNESIUM 40 MG PO CPDR: 40.0000 mg | DELAYED_RELEASE_CAPSULE | Freq: Two times a day (BID) | ORAL | 0 refills | Status: DC

## 2016-04-11 NOTE — Telephone Encounter (Signed)
Perfect- Malachy Mood are you able to block the 2:15 slot that day? Or have someone else do that for me. Thanks so much for getting her in!

## 2016-04-11 NOTE — Telephone Encounter (Signed)
Received refill for Nexium DR 40 mg capsule - #60.   Pharmacy: [facility] [id number].  [place], [place] [address] [phone] Fax: [phone]  Patient is previous patient of Dr. Shawna Orleans. No appointment to establish with new provider yet. Called and spoke to patient. Patient would like to establish with Dr. Yong Channel. Dr. Yong Channel agreed for 1x refill to get her to next appointment. Patient unable to schedule appointment during phone call. Will route message to scheduler to get patient on schedule to establish with Dr. Yong Channel. Rx sent to pharmacy.    Malachy Mood spoke with patient. Patient has appointment to establish with Dr.Hunter in October.

## 2016-05-02 ENCOUNTER — Encounter: Payer: Self-pay | Admitting: Family Medicine

## 2016-05-02 ENCOUNTER — Ambulatory Visit (INDEPENDENT_AMBULATORY_CARE_PROVIDER_SITE_OTHER): Payer: BLUE CROSS/BLUE SHIELD | Admitting: Family Medicine

## 2016-05-02 VITALS — BP 130/80 | HR 75 | Temp 98.1°F | Ht 64.5 in | Wt 203.6 lb

## 2016-05-02 DIAGNOSIS — R209 Unspecified disturbances of skin sensation: Secondary | ICD-10-CM

## 2016-05-02 DIAGNOSIS — F4323 Adjustment disorder with mixed anxiety and depressed mood: Secondary | ICD-10-CM

## 2016-05-02 DIAGNOSIS — Z23 Encounter for immunization: Secondary | ICD-10-CM | POA: Diagnosis not present

## 2016-05-02 DIAGNOSIS — F17200 Nicotine dependence, unspecified, uncomplicated: Secondary | ICD-10-CM

## 2016-05-02 DIAGNOSIS — R7309 Other abnormal glucose: Secondary | ICD-10-CM | POA: Diagnosis not present

## 2016-05-02 DIAGNOSIS — E785 Hyperlipidemia, unspecified: Secondary | ICD-10-CM | POA: Diagnosis not present

## 2016-05-02 DIAGNOSIS — K219 Gastro-esophageal reflux disease without esophagitis: Secondary | ICD-10-CM | POA: Diagnosis not present

## 2016-05-02 LAB — LIPID PANEL
CHOLESTEROL: 237 mg/dL — AB (ref 0–200)
HDL: 35.8 mg/dL — ABNORMAL LOW (ref >39.00)
NonHDL: 201.63
Total CHOL/HDL Ratio: 7
Triglycerides: 217 mg/dL — ABNORMAL HIGH (ref 0.0–149.0)
VLDL: 43.4 mg/dL — ABNORMAL HIGH (ref 0.0–40.0)

## 2016-05-02 LAB — CBC
HCT: 44.2 % (ref 36.0–46.0)
Hemoglobin: 14.9 g/dL (ref 12.0–15.0)
MCHC: 33.7 g/dL (ref 30.0–36.0)
MCV: 88.8 fl (ref 78.0–100.0)
PLATELETS: 244 10*3/uL (ref 150.0–400.0)
RBC: 4.98 Mil/uL (ref 3.87–5.11)
RDW: 13.9 % (ref 11.5–15.5)
WBC: 12.3 10*3/uL — ABNORMAL HIGH (ref 4.0–10.5)

## 2016-05-02 LAB — COMPREHENSIVE METABOLIC PANEL
ALBUMIN: 4.2 g/dL (ref 3.5–5.2)
ALT: 12 U/L (ref 0–35)
AST: 11 U/L (ref 0–37)
Alkaline Phosphatase: 94 U/L (ref 39–117)
BILIRUBIN TOTAL: 0.4 mg/dL (ref 0.2–1.2)
BUN: 10 mg/dL (ref 6–23)
CALCIUM: 9.6 mg/dL (ref 8.4–10.5)
CO2: 30 mEq/L (ref 19–32)
CREATININE: 0.58 mg/dL (ref 0.40–1.20)
Chloride: 103 mEq/L (ref 96–112)
GFR: 113.08 mL/min (ref >60.00)
Glucose, Bld: 87 mg/dL (ref 70–99)
Potassium: 4.2 mEq/L (ref 3.5–5.1)
Sodium: 141 mEq/L (ref 135–145)
Total Protein: 7.2 g/dL (ref 6.0–8.3)

## 2016-05-02 LAB — POC URINALSYSI DIPSTICK (AUTOMATED)
Bilirubin, UA: NEGATIVE
Blood, UA: NEGATIVE
GLUCOSE UA: NEGATIVE
Ketones, UA: NEGATIVE
NITRITE UA: NEGATIVE
Protein, UA: NEGATIVE
Spec Grav, UA: 1.02
UROBILINOGEN UA: 0.2
pH, UA: 6.5

## 2016-05-02 LAB — TSH: TSH: 1.49 u[IU]/mL (ref 0.35–4.50)

## 2016-05-02 LAB — HEMOGLOBIN A1C: HEMOGLOBIN A1C: 5.6 % (ref 4.6–6.5)

## 2016-05-02 LAB — LDL CHOLESTEROL, DIRECT: Direct LDL: 183 mg/dL

## 2016-05-02 LAB — VITAMIN B12: VITAMIN B 12: 303 pg/mL (ref 211–911)

## 2016-05-02 MED ORDER — PRAVASTATIN SODIUM 40 MG PO TABS: 40.0000 mg | ORAL_TABLET | Freq: Every day | ORAL | 3 refills | Status: DC

## 2016-05-02 NOTE — Assessment & Plan Note (Addendum)
S: She also mentions intermittent numbness over lateral right thigh going on 2-3 weeks. No back or hip pain or leg weakness. Some days not at all- some days for a few hours.   Paresthesias previously noted in 2010 but she is not sure what that referenced and states not issue she is dealing with today A/P: She will return in 3 weeks after journaling frequency. Consider back films vs. Ortho referral vs. Neuro appointment. b12 and tsh checked and not low. With a1c not elevated- could also consider prednisone trial before imaging or advanced imaging.

## 2016-05-02 NOTE — Assessment & Plan Note (Signed)
S: 2016 10 year risk is 11.4%. Pravastatin 40mg  rx given by Dr. Shawna Orleans 2016. She has been off medicine A/P:advised to restart pravastatin with LDL >160- sent in

## 2016-05-02 NOTE — Progress Notes (Signed)
Pre visit review using our clinic review tool, if applicable. No additional management support is needed unless otherwise documented below in the visit note. 

## 2016-05-02 NOTE — Assessment & Plan Note (Signed)
S: discussed increased risk of diabetes based on last years labs Lab Results  Component Value Date   HGBA1C 5.9 (H) 01/13/2015  A/P: advised follow up a1c needed

## 2016-05-02 NOTE — Progress Notes (Signed)
Phone: [phone]  Subjective:  Patient presents today to establish care with me as their new primary care provider. Patient was formerly a patient of Dr. Shawna Orleans. Chief complaint-noted.   See problem oriented charting- ROS- No chest pain or shortness of breath. No headache or blurry vision. Occasional mild cough  The following were reviewed and entered/updated in epic: Past Medical History:  Diagnosis Date  . Abnormal glucose   . Anxiety   . Esophageal stricture    Dr.Dora Olevia Perches  . GERD (gastroesophageal reflux disease)   . History of colonic polyps   . Hyperlipidemia   . MVP (mitral valve prolapse)    history of   . Osteoporosis   . Tobacco abuse    tried Chantix- caused agression   Patient Active Problem List   Diagnosis Date Noted  . TOBACCO ABUSE 09/30/2008    Priority: High  . Adjustment disorder with mixed anxiety and depressed mood 08/17/2012    Priority: Medium  . Hot flashes 03/23/2012    Priority: Medium  . GERD 08/28/2009    Priority: Medium  . Abnormal glucose 08/28/2009    Priority: Medium  . PARESTHESIA 11/11/2008    Priority: Medium  . Hyperlipemia 09/30/2008    Priority: Medium  . Osteopenia 09/30/2008    Priority: Medium  . OSA (obstructive sleep apnea) 01/18/2013    Priority: Low  . History of colonic polyps 11/11/2008    Priority: Low  . Benign paroxysmal positional vertigo 10/25/2008    Priority: Low   Past Surgical History:  Procedure Laterality Date  . APPENDECTOMY  2008  . BREAST BIOPSY  2000    Family History  Problem Relation Age of Onset  . Hyperlipidemia    . Hiatal hernia      several family members  . Hypertension Mother   . Cancer Mother     apendix. right hemicolectomy  . Alcohol abuse Father   . Cirrhosis Father     deceased form cirrhosis  . Hypertension Father   . Other      Fh of abnormal glucose  . Hypertension Sister   . Hypertension Brother     Medications- reviewed and updated Current Outpatient  Prescriptions  Medication Sig Dispense Refill  . citalopram (CELEXA) 20 MG tablet TAKE 1 TABLET (20 MG TOTAL) BY MOUTH DAILY. 90 tablet 0  . esomeprazole (NEXIUM) 40 MG capsule Take 1 capsule (40 mg total) by mouth 2 (two) times daily. 180 capsule 0  . mometasone (NASONEX) 50 MCG/ACT nasal spray Place 2 sprays into the nose daily. 17 g 12  . pravastatin (PRAVACHOL) 40 MG tablet Take 1 tablet (40 mg total) by mouth daily. 100 tablet 3   No current facility-administered medications for this visit.     Allergies-reviewed and updated Allergies  Allergen Reactions  . Penicillins Other (See Comments)    whelps, ,skin hot to touch    Social History   Social History  . Marital status: Married    Spouse name: N/A  . Number of children: N/A  . Years of education: N/A   Occupational History  . administrative assitant     Social History Main Topics  . Smoking status: Current Every Day Smoker    Packs/day: 1.50    Years: 36.00    Start date: 07/15/1978  . Smokeless tobacco: Not on file  . Alcohol use No  . Drug use: No  . Sexual activity: Not Currently   Other Topics Concern  . Not on file  Social History Narrative   Married over 30 years. Sons 29,35 in 2017. 2 grandkis- 1 grandaughter - [place] . 1 grandson. [place] lewis. All grandkids same son      Cares for mother (drives back and forth to help), husband alcoholic- tremendous stress. Sister stays with mom- stressor      Occupation: office admin - [employer]       Objective: BP 130/80 (BP Location: Left Arm, Patient Position: Sitting, Cuff Size: Large)   Pulse 75   Temp 98.1 F (36.7 C) (Oral)   Ht 5' 4.5" (1.638 m)   Wt 203 lb 9.6 oz (92.4 kg)   SpO2 94%   BMI 34.41 kg/m  Gen: NAD, resting comfortably CV: RRR no murmurs rubs or gallops Lungs: CTAB no crackles, wheeze, rhonchi Abdomen: soft/nontender/nondistended/normal bowel sounds. No rebound or guarding.  Ext: no edema Skin: warm, dry Neuro: grossly  normal, moves all extremities, normal leg strength, denies sensation to gross touch on lateral thigh.  Assessment/Plan:   GERD S: controlled on nexium 40mg . In 2014 she was to trial zantac 150mg  BID but failed A/P: continue PPI   Abnormal glucose S: discussed increased risk of diabetes based on last years labs Lab Results  Component Value Date   HGBA1C 5.9 (H) 01/13/2015  A/P: advised follow up a1c needed  TOBACCO ABUSE 1 PPD. Started HS. 41 years smoking likely 41 pack years. Refer for lung cancer screening. Advised cessation- not ready  Adjustment disorder with mixed anxiety and depressed mood S: has been noncompliant with celexa. Stressors have been up- anxiety up also. No SI/HI A/P: advised patient to restart celexa 20mg  and follow up in 2 weeks. Discussed suicidality risk of SSRI and follo wup   Hyperlipemia S: 2016 10 year risk is 11.4%. Pravastatin 40mg  rx given by Dr. Shawna Orleans 2016. She has been off medicine A/P:advised to restart pravastatin with LDL >160- sent in   PARESTHESIA S: She also mentions intermittent numbness over lateral right thigh going on 2-3 weeks. No back or hip pain or leg weakness. Some days not at all- some days for a few hours.   Paresthesias previously noted in 2010 but she is not sure what that referenced and states not issue she is dealing with today A/P: She will return in 3 weeks after journaling frequency. Consider back films vs. Ortho referral vs. Neuro appointment. b12 and tsh checked and not low. With a1c not elevated- could also consider prednisone trial before imaging or advanced imaging.    3 weeks.   Flu shot given. Please get a Mammogram at GYN- and have them send me a copy. Fasting even though late in day.    Orders Placed This Encounter  Procedures  . Flu Vaccine QUAD 36+ mos IM  . CBC    [place]  . Comprehensive metabolic panel    [place]    Order Specific Question:   Has the patient fasted?    Answer:   No  . Hemoglobin A1c      [place]  . Lipid panel    [place]    Order Specific Question:   Has the patient fasted?    Answer:   No  . Vitamin B12  . TSH    [place]  . LDL cholesterol, direct  . Ambulatory Referral for Lung Cancer Scre    Referral Priority:   Routine    Referral Type:   Consultation    Referral Reason:   Specialty Services Required    Number  of Visits Requested:   1  . POCT Urinalysis Dipstick (Automated)   Meds ordered this encounter  Medications  . pravastatin (PRAVACHOL) 40 MG tablet    Sig: Take 1 tablet (40 mg total) by mouth daily.    Dispense:  100 tablet    Refill:  3   Return precautions advised.  [name], MD

## 2016-05-02 NOTE — Patient Instructions (Addendum)
Take celexa 20mg  every day and do not miss doses. Would advise calling our behavioral health team.   Restart Pravastatin   Labs before you leave including a1c,   Flu shot before you leave  Please get a Mammogram at GYN- and have them send me a copy  We will call you within a week about your referral to lung cancer screening program. If you do not hear within 2 weeks, give [place] a call.   Follow your hip numbness issues over 2-3 weeks (journal frequency and duration of symptoms) and also get regular on celexa. See me back at that time so we can reassess.  Considering x-ray. Will get b12 with labs today  If any thoughts of hurting yourself contact us immediately

## 2016-05-02 NOTE — Assessment & Plan Note (Signed)
1 PPD. Started HS. 41 years smoking likely 41 pack years. Refer for lung cancer screening. Advised cessation- not ready

## 2016-05-02 NOTE — Assessment & Plan Note (Signed)
S: controlled on nexium 40mg . In 2014 she was to trial zantac 150mg  BID but failed A/P: continue PPI

## 2016-05-02 NOTE — Assessment & Plan Note (Signed)
S: has been noncompliant with celexa. Stressors have been up- anxiety up also. No SI/HI A/P: advised patient to restart celexa 20mg  and follow up in 2 weeks. Discussed suicidality risk of SSRI and follo wup

## 2016-05-23 ENCOUNTER — Ambulatory Visit (INDEPENDENT_AMBULATORY_CARE_PROVIDER_SITE_OTHER): Payer: BLUE CROSS/BLUE SHIELD | Admitting: Family Medicine

## 2016-05-23 ENCOUNTER — Encounter: Payer: Self-pay | Admitting: Family Medicine

## 2016-05-23 VITALS — BP 124/70 | HR 73 | Temp 98.8°F | Wt 202.6 lb

## 2016-05-23 DIAGNOSIS — R209 Unspecified disturbances of skin sensation: Secondary | ICD-10-CM

## 2016-05-23 DIAGNOSIS — L989 Disorder of the skin and subcutaneous tissue, unspecified: Secondary | ICD-10-CM | POA: Diagnosis not present

## 2016-05-23 DIAGNOSIS — D72823 Leukemoid reaction: Secondary | ICD-10-CM | POA: Insufficient documentation

## 2016-05-23 DIAGNOSIS — F4323 Adjustment disorder with mixed anxiety and depressed mood: Secondary | ICD-10-CM

## 2016-05-23 DIAGNOSIS — D72829 Elevated white blood cell count, unspecified: Secondary | ICD-10-CM

## 2016-05-23 DIAGNOSIS — E785 Hyperlipidemia, unspecified: Secondary | ICD-10-CM | POA: Diagnosis not present

## 2016-05-23 DIAGNOSIS — J019 Acute sinusitis, unspecified: Secondary | ICD-10-CM

## 2016-05-23 MED ORDER — PREDNISONE 20 MG PO TABS: 20.0000 mg | ORAL_TABLET | Freq: Every day | ORAL | 0 refills | Status: DC

## 2016-05-23 NOTE — Assessment & Plan Note (Signed)
S: started citalopram 20mg  back a few weeks ago and already has noted much improvement in her stress levels. PHQ2 of 0 and no SI/HI A/P: continue current medicines. She mentions stress seemed even easier to manage when on hormone replacement- would advise patient to give this a little more time as just resarted before discussing with GYN

## 2016-05-23 NOTE — Progress Notes (Signed)
Subjective:  [name] [name] is a 60 y.o. year old very pleasant female patient who presents for/with See problem oriented charting ROS- no fever, chills, shortness of breath, nausea, vomiting.see any ROS included in HPI as well.   Past Medical History-  Patient Active Problem List   Diagnosis Date Noted  . TOBACCO ABUSE 09/30/2008    Priority: High  . Adjustment disorder with mixed anxiety and depressed mood 08/17/2012    Priority: Medium  . Hot flashes 03/23/2012    Priority: Medium  . GERD 08/28/2009    Priority: Medium  . Abnormal glucose 08/28/2009    Priority: Medium  . PARESTHESIA 11/11/2008    Priority: Medium  . Hyperlipemia 09/30/2008    Priority: Medium  . Osteopenia 09/30/2008    Priority: Medium  . OSA (obstructive sleep apnea) 01/18/2013    Priority: Low  . History of colonic polyps 11/11/2008    Priority: Low  . Benign paroxysmal positional vertigo 10/25/2008    Priority: Low  . Leukocytosis 05/23/2016    Medications- reviewed and updated Current Outpatient Prescriptions  Medication Sig Dispense Refill  . citalopram (CELEXA) 20 MG tablet TAKE 1 TABLET (20 MG TOTAL) BY MOUTH DAILY. 90 tablet 0  . esomeprazole (NEXIUM) 40 MG capsule Take 1 capsule (40 mg total) by mouth 2 (two) times daily. 180 capsule 0  . mometasone (NASONEX) 50 MCG/ACT nasal spray Place 2 sprays into the nose daily. 17 g 12  . pravastatin (PRAVACHOL) 40 MG tablet Take 1 tablet (40 mg total) by mouth daily. 100 tablet 3  . predniSONE (DELTASONE) 20 MG tablet Take 1 tablet (20 mg total) by mouth daily with breakfast. 7 tablet 0   No current facility-administered medications for this visit.     Objective: BP 124/70 (BP Location: Left Arm, Patient Position: Sitting, Cuff Size: Large)   Pulse 73   Temp 98.8 F (37.1 C) (Oral)   Wt 202 lb 9.6 oz (91.9 kg)   SpO2 93%   BMI 34.24 kg/m  Gen: NAD, resting comfortably, appears slightly fatigued Intermittent cough, mild maxillary sinus pressure,  nares with clear drainage, pharynx mildly erythematous CV: RRR no murmurs rubs or gallops Lungs: CTAB no crackles, wheeze, rhonchi  Ext: no edema   Assessment/Plan:  Adjustment disorder with mixed anxiety and depressed mood S: started citalopram 20mg  back a few weeks ago and already has noted much improvement in her stress levels. PHQ2 of 0 and no SI/HI A/P: continue current medicines. She mentions stress seemed even easier to manage when on hormone replacement- would advise patient to give this a little more time as just resarted before discussing with GYN   Hyperlipemia S: poorly controlled on no meds- last visit started pravastatin 40mg  back. No myalgias.  Lab Results  Component Value Date   CHOL 237 (H) 05/02/2016   HDL 35.80 (L) 05/02/2016   LDLCALC 139 (H) 01/13/2015   LDLDIRECT 183.0 05/02/2016   TRIG 217.0 (H) 05/02/2016   CHOLHDL 7 05/02/2016   A/P: check LDL, triglycerides, total at next visit likely   Leukocytosis S: Mom with MGUS sees Dr. [name]. Intermittent WBC elevations for several years since 2013 but has gone back down to normal at times A/P: we discussed getting cbc and path smear review- may end up referring to Dr. [name] for consultation. Discussed prednisone may cause temporary rise in wbc so glad doing testing before treatment.    Sinusitis Paresthesias S:Intermittent numbness over lateral right thigh for about 4-5 weeks. No back or  hip pain or leg weakness. Prior paresthesisas noted 2010 but unclear what that referenced. Slowing down since last visit. Perhaps once every 5-6 days (less often) lasting 35-40 minutes. b12 and tsh were normal  Stopped up sinuses for several weeks, coughing. Mild sinus pressure at times. Taking sudafed as needed. Doing some theraflu. Responds well to prednisone A/P:for paresthesia- Discussed further workup with ortho or neuro referral or back films- she declines for now  Prednisone 20mg  for 7 days planned for sinusitis (may  be bacterial though given duration but has responded well in past to prednisone). There may be some overlap if any nerve irritation or impingement so she also wants to try that in relation to paresthesias.   Skin  lesion S:Skin lesion right hand for 1 year -growing, scales at times  A/P: possible large AK vs. Squamous cell vs. Other lesion- refer to derm for evaluation  1 PPD- not ready to quit. Advised to quit  Mammogram with gyn in next few months  Orders Placed This Encounter  Procedures  . CBC with Differential/Platelet  . Pathologist smear review  . Ambulatory referral to Dermatology    Referral Priority:   Routine    Referral Type:   Consultation    Referral Reason:   Specialty Services Required    Requested Specialty:   Dermatology    Number of Visits Requested:   1    Meds ordered this encounter  Medications  . predniSONE (DELTASONE) 20 MG tablet    Sig: Take 1 tablet (20 mg total) by mouth daily with breakfast.    Dispense:  7 tablet    Refill:  0   Return precautions advised.  [name], MD

## 2016-05-23 NOTE — Progress Notes (Signed)
Pre visit review using our clinic review tool, if applicable. No additional management support is needed unless otherwise documented below in the visit note. 

## 2016-05-23 NOTE — Assessment & Plan Note (Signed)
S: Mom with MGUS sees Dr. [name]. Intermittent WBC elevations for several years since 2013 but has gone back down to normal at times A/P: we discussed getting cbc and path smear review- may end up referring to Dr. [name] for consultation. Discussed prednisone may cause temporary rise in wbc so glad doing testing before treatment.

## 2016-05-23 NOTE — Patient Instructions (Addendum)
Full effect of celexa can take 4-6 weeks so hopeful you may not even need the hormone replacement to help you manage stress  Have gyn send [place] mammogram when completed  Labs before you leave. Consider consult with Dr. [name] especially with your moms history  We will call you within a week about your referral to dermatology. If you do not hear within 2 weeks, give [place] a call.

## 2016-05-23 NOTE — Assessment & Plan Note (Signed)
S: poorly controlled on no meds- last visit started pravastatin 40mg  back. No myalgias.  Lab Results  Component Value Date   CHOL 237 (H) 05/02/2016   HDL 35.80 (L) 05/02/2016   LDLCALC 139 (H) 01/13/2015   LDLDIRECT 183.0 05/02/2016   TRIG 217.0 (H) 05/02/2016   CHOLHDL 7 05/02/2016   A/P: check LDL, triglycerides, total at next visit likely

## 2016-05-24 ENCOUNTER — Other Ambulatory Visit: Payer: Self-pay

## 2016-05-24 DIAGNOSIS — D72829 Elevated white blood cell count, unspecified: Secondary | ICD-10-CM

## 2016-05-24 LAB — CBC WITH DIFFERENTIAL/PLATELET
Basophils Absolute: 0.1 10*3/uL (ref 0.0–0.1)
Basophils Relative: 1 % (ref 0.0–3.0)
EOS ABS: 0.3 10*3/uL (ref 0.0–0.7)
Eosinophils Relative: 2.2 % (ref 0.0–5.0)
HEMATOCRIT: 42.6 % (ref 36.0–46.0)
HEMOGLOBIN: 14.5 g/dL (ref 12.0–15.0)
LYMPHS PCT: 23.9 % (ref 12.0–46.0)
Lymphs Abs: 2.8 10*3/uL (ref 0.7–4.0)
MCHC: 34.1 g/dL (ref 30.0–36.0)
MCV: 88.4 fl (ref 78.0–100.0)
MONO ABS: 0.7 10*3/uL (ref 0.1–1.0)
Monocytes Relative: 5.9 % (ref 3.0–12.0)
Neutro Abs: 8 10*3/uL — ABNORMAL HIGH (ref 1.4–7.7)
Neutrophils Relative %: 67 % (ref 43.0–77.0)
Platelets: 248 10*3/uL (ref 150.0–400.0)
RBC: 4.82 Mil/uL (ref 3.87–5.11)
RDW: 13.4 % (ref 11.5–15.5)
WBC: 11.9 10*3/uL — AB (ref 4.0–10.5)

## 2016-05-24 LAB — PATHOLOGIST SMEAR REVIEW

## 2016-05-27 ENCOUNTER — Telehealth: Payer: Self-pay | Admitting: Family Medicine

## 2016-05-27 NOTE — Telephone Encounter (Signed)
Pt states she is now having UTI symptoms. Frequent urinanation, urgency, and discomfort. Would like Dr Yong Channel to proceed with an Rx for an UTI.  [facility] [address]

## 2016-05-28 ENCOUNTER — Other Ambulatory Visit: Payer: Self-pay | Admitting: Family Medicine

## 2016-05-28 MED ORDER — NITROFURANTOIN MONOHYD MACRO 100 MG PO CAPS: 100.0000 mg | ORAL_CAPSULE | Freq: Two times a day (BID) | ORAL | 0 refills | Status: DC

## 2016-05-28 NOTE — Telephone Encounter (Signed)
Pt is aware.  

## 2016-05-28 NOTE — Telephone Encounter (Signed)
Sent in nitrofurantoin for 7 days. If this does not resolve her symptoms or if recurs- needs return visit and likely get urine culture at that time

## 2016-05-28 NOTE — Progress Notes (Signed)
macrobid

## 2016-06-03 ENCOUNTER — Other Ambulatory Visit: Payer: Self-pay

## 2016-06-03 DIAGNOSIS — K219 Gastro-esophageal reflux disease without esophagitis: Secondary | ICD-10-CM

## 2016-06-03 MED ORDER — ESOMEPRAZOLE MAGNESIUM 40 MG PO CPDR: 40.0000 mg | DELAYED_RELEASE_CAPSULE | Freq: Two times a day (BID) | ORAL | 2 refills | Status: DC

## 2016-06-11 ENCOUNTER — Encounter: Payer: Self-pay | Admitting: Family Medicine

## 2016-06-11 ENCOUNTER — Telehealth: Payer: Self-pay | Admitting: Family Medicine

## 2016-06-11 MED ORDER — DOXYCYCLINE HYCLATE 100 MG PO TABS: 100.0000 mg | ORAL_TABLET | Freq: Two times a day (BID) | ORAL | 0 refills | Status: DC

## 2016-06-11 NOTE — Telephone Encounter (Signed)
Pt would like to see if you could call her a antibiotic for the congestion she stated that the prednisone helped her but she has the congestion and a ugly cough from the drainage.   Pharm:  [facility] [address]

## 2016-06-11 NOTE — Telephone Encounter (Signed)
Responded by mychart

## 2016-06-25 ENCOUNTER — Telehealth: Payer: Self-pay

## 2016-06-25 ENCOUNTER — Telehealth: Payer: Self-pay | Admitting: Family Medicine

## 2016-06-25 NOTE — Telephone Encounter (Signed)
Prior Auth for Nexium Needed

## 2016-06-25 NOTE — Telephone Encounter (Signed)
Received PA request from pharmacy for esomeprazole 40 mg capsules. PA submitted & pending. Key: [id number]

## 2016-06-25 NOTE — Telephone Encounter (Signed)
PA submitted & pending. Key: [id number]

## 2016-06-25 NOTE — Telephone Encounter (Signed)
PA approved, form faxed back to pharmacy. 

## 2016-07-10 ENCOUNTER — Ambulatory Visit: Payer: BLUE CROSS/BLUE SHIELD

## 2016-07-10 ENCOUNTER — Ambulatory Visit (HOSPITAL_BASED_OUTPATIENT_CLINIC_OR_DEPARTMENT_OTHER): Payer: BLUE CROSS/BLUE SHIELD | Admitting: Hematology & Oncology

## 2016-07-10 ENCOUNTER — Ambulatory Visit (HOSPITAL_BASED_OUTPATIENT_CLINIC_OR_DEPARTMENT_OTHER)
Admission: RE | Admit: 2016-07-10 | Discharge: 2016-07-10 | Disposition: A | Discharge status: Home / Self Care | Payer: BLUE CROSS/BLUE SHIELD | Source: Ambulatory Visit | Attending: Hematology & Oncology | Admitting: Hematology & Oncology

## 2016-07-10 ENCOUNTER — Other Ambulatory Visit (HOSPITAL_BASED_OUTPATIENT_CLINIC_OR_DEPARTMENT_OTHER): Payer: BLUE CROSS/BLUE SHIELD

## 2016-07-10 VITALS — BP 132/71 | HR 68 | Temp 98.4°F | Resp 18 | Wt 206.0 lb

## 2016-07-10 DIAGNOSIS — J40 Bronchitis, not specified as acute or chronic: Secondary | ICD-10-CM | POA: Diagnosis not present

## 2016-07-10 DIAGNOSIS — R05 Cough: Secondary | ICD-10-CM | POA: Insufficient documentation

## 2016-07-10 DIAGNOSIS — Z720 Tobacco use: Secondary | ICD-10-CM | POA: Insufficient documentation

## 2016-07-10 DIAGNOSIS — D72823 Leukemoid reaction: Secondary | ICD-10-CM

## 2016-07-10 DIAGNOSIS — D72829 Elevated white blood cell count, unspecified: Secondary | ICD-10-CM | POA: Diagnosis not present

## 2016-07-10 LAB — CBC WITH DIFFERENTIAL (CANCER CENTER ONLY)
BASO#: 0.1 10*3/uL (ref 0.0–0.2)
BASO%: 0.6 % (ref 0.0–2.0)
EOS ABS: 0.2 10*3/uL (ref 0.0–0.5)
EOS%: 2.1 % (ref 0.0–7.0)
HCT: 42.3 % (ref 34.8–46.6)
HEMOGLOBIN: 14.5 g/dL (ref 11.6–15.9)
LYMPH#: 3.2 10*3/uL (ref 0.9–3.3)
LYMPH%: 30.2 % (ref 14.0–48.0)
MCH: 30.9 pg (ref 26.0–34.0)
MCHC: 34.3 g/dL (ref 32.0–36.0)
MCV: 90 fL (ref 81–101)
MONO#: 0.7 10*3/uL (ref 0.1–0.9)
MONO%: 6.4 % (ref 0.0–13.0)
NEUT%: 60.7 % (ref 39.6–80.0)
NEUTROS ABS: 6.4 10*3/uL (ref 1.5–6.5)
Platelets: 227 10*3/uL (ref 145–400)
RBC: 4.69 10*6/uL (ref 3.70–5.32)
RDW: 13.3 % (ref 11.1–15.7)
WBC: 10.5 10*3/uL — AB (ref 3.9–10.0)

## 2016-07-10 LAB — CHCC SATELLITE - SMEAR

## 2016-07-10 NOTE — Progress Notes (Signed)
Referral MD  Reason for Referral: Leukocytosis-chronic/mild   Chief Complaint  Patient presents with  . New Evaluation  : I'm not sure why I am here.  HPI: [name] [name] is a very nice 60 year old white female. She has is a daughter of my patient [name] [name].  She is a smoker for about 40 years. She has about an 80-pack-year history of tobacco use.  Her regular doctor had to retire for health reasons. Now, she has a new doctor, Dr. Yong [name].  She has been noted to have some leukocytosis. Going through her record, the leukocytosis has been mild and has been present for several years.  Going back to 2014, her white cell count 12.8. It did go down for a year so. In April 2017 her white cell count was 11.9.  Her most recent white cell count in November was 11.9 with a hemoglobin of 14.5. Platelet count 248,000. She had a normal white cell differential.  Her doctor was worried about some potential blood problem. [name] [name] mother has an MGUS. I think Dr. Yong [name] wanted to make sure that [name] [name] did not have this.  She has not had problems with infections. She's had no rashes. She's had no weight loss or weight gain.  She has a dry cough. She does not recall when her last chest x-ray was. We executed one on her today. This showed some bronchitis changes.  She really does not drink. She has no obvious occupational exposures.  There is no palpable lymph glands. She's had no mouth sores. She's had no dysphagia or odynophagia.  She was kindly referred to the [facility] for an evaluation of the leukocytosis.  Overall, her performance status is ECOG 0.    Past Medical History:  Diagnosis Date  . Abnormal glucose   . Anxiety   . Esophageal stricture    Dr.Dora Olevia [name]  . GERD (gastroesophageal reflux disease)   . History of colonic polyps   . Hyperlipidemia   . MVP (mitral valve prolapse)    history of   . Osteoporosis   . Tobacco abuse    tried Chantix- caused  agression  :  Past Surgical History:  Procedure Laterality Date  . APPENDECTOMY  2008  . BREAST BIOPSY  2000  :   Current Outpatient Prescriptions:  .  citalopram (CELEXA) 20 MG tablet, TAKE 1 TABLET (20 MG TOTAL) BY MOUTH DAILY., Disp: 90 tablet, Rfl: 0 .  esomeprazole (NEXIUM) 40 MG capsule, Take 1 capsule (40 mg total) by mouth 2 (two) times daily., Disp: 180 capsule, Rfl: 2 .  mometasone (NASONEX) 50 MCG/ACT nasal spray, Place 2 sprays into the nose daily. (Patient taking differently: Place 2 sprays into the nose daily as needed. ), Disp: 17 g, Rfl: 12 .  pravastatin (PRAVACHOL) 40 MG tablet, Take 1 tablet (40 mg total) by mouth daily., Disp: 100 tablet, Rfl: 3 .  predniSONE (DELTASONE) 20 MG tablet, Take 1 tablet (20 mg total) by mouth daily with breakfast. (Patient taking differently: Take 20 mg by mouth daily as needed. ), Disp: 7 tablet, Rfl: 0:  :  Allergies  Allergen Reactions  . Penicillins Other (See Comments)    whelps, ,skin hot to touch  :  Family History  Problem Relation Age of Onset  . Hyperlipidemia    . Hiatal hernia      several family members  . Hypertension Mother   . Cancer Mother     apendix. right hemicolectomy  . Alcohol  abuse Father   . Cirrhosis Father     deceased form cirrhosis  . Hypertension Father   . Other      Fh of abnormal glucose  . Hypertension Sister   . Hypertension Brother   :  Social History   Social History  . Marital status: Married    Spouse name: N/A  . Number of children: N/A  . Years of education: N/A   Occupational History  . administrative assitant     Social History Main Topics  . Smoking status: Current Every Day Smoker    Packs/day: 1.50    Years: 36.00    Start date: 07/15/1978  . Smokeless tobacco: Not on file  . Alcohol use No  . Drug use: No  . Sexual activity: Not Currently   Other Topics Concern  . Not on file   Social History Narrative   Married over 30 years. Sons 29,35 in 2017. 2  grandkis- 1 grandaughter - [place] Haw . 1 grandson. Hudson lewis. All grandkids same son      Cares for mother (drives back and forth to help), husband alcoholic- tremendous stress. Sister stays with mom- stressor      Occupation: office [employer]      :  Pertinent items are noted in HPI.  Exam: @IPVITALS @ Well-developed and well-nourished white female in no obvious distress. Her vital signs are temperature of 98.4. Pulse is 68. Blood pressure 133/77. Weight is 206 pounds. Head and neck exam shows no ocular or oral lesions. She has no scleral icterus. She has no adenopathy in the neck. Thyroid is nonpalpable. Lungs are relatively clear bilaterally. Cardiac exam regular rate and rhythm with no murmurs, rubs or bruits. Abdomen is soft. She has good bowel sounds. She is mildly obese. She has no fluid wave. There is no palpable liver or spleen tip. Back exam shows no tenderness over the spine, ribs or hips. Extremities shows no clubbing, cyanosis or edema. Neurological exam shows no focal neurological deficits. Skin exam shows no rashes, ecchymoses or petechia.  Recent Labs  07/10/16 1531  WBC 10.5*  HGB 14.5  HCT 42.3  PLT 227   No results for input(s): NA, K, CL, CO2, GLUCOSE, BUN, CREATININE, CALCIUM in the last 72 hours.  Blood smear review: Normochromic and normocytic population of red blood cells. There are no nucleated red blood cells. There are no teardrop cells. I see no target cells or inclusion bodies. White blood cells been normal in morphology maturation. There is no immature myeloid or lymphoid forms. I see no hyper segmented polys. Platelets are adequate in number and size.  Pathology: None     Assessment and Plan:  [name] [name] is a 60 year old white female. She has minimal leukocytosis. She has a chest x-ray which shows some bronchitis.  I wonder if this might be a leukemoid reaction from her smoking. We can sometimes see a slight leukocytosis and women who  smoke. Her chest x-ray was reassuring that there is no obvious malignancy. Iron was at a CT of the chest would be a little bit better given her history of extensive tobacco use.  I can find nothing on her exam that suggest an underlying malignancy.  Her blood smear does not show anything that looks like a myeloproliferative process.  She has obviously had this mild leukocytosis for several years. Again, given the fact that she has a normal white cell differential, I have to believe that the transient leukocytosis is more  of a reaction and leukemoid process.  I spent about 45 minutes with her. I went over the lab work. We will call her about the results of the chest x-ray.  I really don't think we have to see her back in the clinic. I just don't see that we have any type of hematologic issue to worry about. She definitely does not need a bone marrow test. She is reassured.  She is thankful that Dr. Yong [name] sent her to our office. She has always felt comfortable in her office when she came with her mom.

## 2016-07-18 ENCOUNTER — Other Ambulatory Visit: Payer: Self-pay | Admitting: *Deleted

## 2016-07-18 DIAGNOSIS — K219 Gastro-esophageal reflux disease without esophagitis: Secondary | ICD-10-CM

## 2016-07-18 MED ORDER — ESOMEPRAZOLE MAGNESIUM 40 MG PO CPDR: 40.0000 mg | DELAYED_RELEASE_CAPSULE | Freq: Two times a day (BID) | ORAL | 1 refills | Status: DC

## 2016-08-05 ENCOUNTER — Ambulatory Visit (INDEPENDENT_AMBULATORY_CARE_PROVIDER_SITE_OTHER): Payer: BLUE CROSS/BLUE SHIELD | Admitting: Family Medicine

## 2016-08-05 ENCOUNTER — Telehealth: Payer: Self-pay

## 2016-08-05 ENCOUNTER — Encounter: Payer: Self-pay | Admitting: Family Medicine

## 2016-08-05 VITALS — BP 144/82 | HR 66 | Temp 98.0°F | Ht 64.5 in | Wt 210.4 lb

## 2016-08-05 DIAGNOSIS — F17200 Nicotine dependence, unspecified, uncomplicated: Secondary | ICD-10-CM | POA: Diagnosis not present

## 2016-08-05 DIAGNOSIS — R062 Wheezing: Secondary | ICD-10-CM | POA: Diagnosis not present

## 2016-08-05 DIAGNOSIS — R059 Cough, unspecified: Secondary | ICD-10-CM

## 2016-08-05 DIAGNOSIS — R05 Cough: Secondary | ICD-10-CM | POA: Diagnosis not present

## 2016-08-05 MED ORDER — PREDNISONE 20 MG PO TABS: ORAL_TABLET | ORAL | 0 refills | Status: DC

## 2016-08-05 MED ORDER — GUAIFENESIN-CODEINE 100-10 MG/5ML PO SOLN: 5.0000 mL | Freq: Four times a day (QID) | ORAL | 0 refills | Status: DC | PRN

## 2016-08-05 NOTE — Progress Notes (Signed)
Pre visit review using our clinic review tool, if applicable. No additional management support is needed unless otherwise documented below in the visit note. 

## 2016-08-05 NOTE — Patient Instructions (Signed)
I hope this is just an upper respiratory infection but very well could be bronchitis as well. Upper respiratory infections last up to 2 weeks where as bronchitis is more in 3-6 week range.   Let's try prednisone 7 day course given your long term smoking history and mild wheezing today  Codeine cough syrup if you dont have to drive for 8 hours. Usually this is best used at night.   Give me an update in about 10 days or sooner if you worsen or particularly fever or shortness of breath

## 2016-08-05 NOTE — Progress Notes (Addendum)
PCP: [name], MD  Subjective:  [name] [name] is a 61 y.o. year old very pleasant female patient who presents with  symptoms including nasal congestion, sore throat, cough, now feels like symptoms have moved down into her chest -started: 7 days ago, symptoms are improving mildly -previous treatments:  Dayquil, sudafed -sick contacts/travel/risks: denies flu exposure.   ROS-denies fever, SOB (above baseline), NVD, tooth pain  Pertinent Past Medical History-  Patient Active Problem List   Diagnosis Date Noted  . TOBACCO ABUSE 09/30/2008    Priority: High  . Adjustment disorder with mixed anxiety and depressed mood 08/17/2012    Priority: Medium  . Hot flashes 03/23/2012    Priority: Medium  . GERD 08/28/2009    Priority: Medium  . Abnormal glucose 08/28/2009    Priority: Medium  . PARESTHESIA 11/11/2008    Priority: Medium  . Hyperlipemia 09/30/2008    Priority: Medium  . Osteopenia 09/30/2008    Priority: Medium  . OSA (obstructive sleep apnea) 01/18/2013    Priority: Low  . History of colonic polyps 11/11/2008    Priority: Low  . Benign paroxysmal positional vertigo 10/25/2008    Priority: Low  . 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 Leukocytosis 05/23/2016   Medications- reviewed  Current Outpatient Prescriptions  Medication Sig Dispense Refill  . citalopram (CELEXA) 20 MG tablet TAKE 1 TABLET (20 MG TOTAL) BY MOUTH DAILY. 90 tablet 0  . esomeprazole (NEXIUM) 40 MG capsule Take 1 capsule (40 mg total) by mouth 2 (two) times daily. 180 capsule 1  . mometasone (NASONEX) 50 MCG/ACT nasal spray Place 2 sprays into the nose daily. (Patient taking differently: Place 2 sprays into the nose daily as needed. ) 17 g 12  . pravastatin (PRAVACHOL) 40 MG tablet Take 1 tablet (40 mg total) by mouth daily. 100 tablet 3  . guaiFENesin-codeine 100-10 MG/5ML syrup Take 5 mLs by mouth every 6 (six) hours as needed for cough. 120 mL 0  . predniSONE (DELTASONE) 20 MG tablet Take 2 pills for  2 days, 1 pill for 5 days 9 tablet 0   No current facility-administered medications for this visit.     Objective: BP (!) 144/82   Pulse 66   Temp 98 F (36.7 C) (Oral)   Ht 5' 4.5" (1.638 m)   Wt 210 lb 6.4 oz (95.4 kg)   SpO2 94%   BMI 35.56 kg/m  Gen: NAD, resting comfortably HEENT: Turbinates erythematous, TM normal, pharynx mildly erythematous with no tonsilar exudate or edema, no sinus tenderness CV: RRR no murmurs rubs or gallops Lungs: mild diffuse wheeze but no no crackles. No labored breathing Abdomen: soft/nontender/nondistended/normal bowel sounds. No rebound or guarding.  Ext: no edema Skin: warm, dry, no rash Neuro: grossly normal, moves all extremities  Assessment/Plan:  Upper Respiratory infection vs. Bronchitis (time will tell) vs copd exacerbation with long term smoking (but no formal diagnosis) History and exam today are suggestive of viral infection most likely due to upper respiratory infection. Symptomatic treatment with: codeine cough syrup. We are covering with prednisone due to her wheeze. I do not think she requires antibiotics at this time though may have underlying COPD with long term smoking- if doesn't improve would consider but has been slowly improving.   We discussed that we did not find any infection that had higher probability of being bacterial such as pneumonia or strep throat. We discussed signs that bacterial infection may have developed particularly fever or shortness of breath. Likely course of  1-2 weeks. Patient is contagious and advised good handwashing and consideration of mask If going to be in public places.   Finally, we reviewed reasons to return to care including if symptoms worsen or persist or new concerns arise- once again particularly shortness of breath or fever.  High risk patient due to age and long term smoking history  Meds ordered this encounter  Medications  . predniSONE (DELTASONE) 20 MG tablet    Sig: Take 2 pills for  2 days, 1 pill for 5 days    Dispense:  9 tablet    Refill:  0  . guaiFENesin-codeine 100-10 MG/5ML syrup    Sig: Take 5 mLs by mouth every 6 (six) hours as needed for cough.    Dispense:  120 mL    Refill:  0   [name], MD

## 2016-08-05 NOTE — Telephone Encounter (Signed)
May substitute the chlorpheniramine/codeine combo

## 2016-08-05 NOTE — Telephone Encounter (Signed)
[facility] cough syrup prescribed today is on back order. They need to know if ok to substitute Chlorpheniramine/Codeine or Promethazine/Codeine.   Dr. Yong Channel - Please advise. Thanks!

## 2016-08-06 MED ORDER — CHLORPHENIRAMINE-CODEINE 2-10 MG/5ML PO LIQD: 5.0000 mL | Freq: Four times a day (QID) | ORAL | 0 refills | Status: DC | PRN

## 2016-08-06 NOTE — Telephone Encounter (Signed)
Spoke with [facility]icks[facility] and advised of change in medication. She states that it can be E-Scrobed since they are out of original medication. Rx sent. Nothing further needed.

## 2016-08-06 NOTE — Telephone Encounter (Signed)
Rx would not E-Scribe. Rx printed and signed. Pt aware and will come pick up. Nothing further needed.

## 2016-08-20 ENCOUNTER — Encounter: Payer: Self-pay | Admitting: Family Medicine

## 2016-08-20 ENCOUNTER — Ambulatory Visit (INDEPENDENT_AMBULATORY_CARE_PROVIDER_SITE_OTHER): Payer: BLUE CROSS/BLUE SHIELD | Admitting: Family Medicine

## 2016-08-20 VITALS — BP 136/84 | HR 69 | Temp 98.0°F | Ht 64.5 in | Wt 210.4 lb

## 2016-08-20 DIAGNOSIS — B9689 Other specified bacterial agents as the cause of diseases classified elsewhere: Secondary | ICD-10-CM | POA: Diagnosis not present

## 2016-08-20 DIAGNOSIS — J329 Chronic sinusitis, unspecified: Secondary | ICD-10-CM | POA: Diagnosis not present

## 2016-08-20 DIAGNOSIS — R030 Elevated blood-pressure reading, without diagnosis of hypertension: Secondary | ICD-10-CM | POA: Diagnosis not present

## 2016-08-20 MED ORDER — DOXYCYCLINE HYCLATE 100 MG PO TABS: 100.0000 mg | ORAL_TABLET | Freq: Two times a day (BID) | ORAL | 0 refills | Status: DC

## 2016-08-20 NOTE — Progress Notes (Signed)
PCP: [name], MD  Subjective:  [name] [name] is a 61 y.o. year old very pleasant female patient who presents with sinusitis symptoms including nasal congestion, sinus tenderness that started after treatment prior illness -other symptoms include: some cough and chest congestion and wheeze which improved on prednisone over 10 days ago -day of illness:at least 2 weeks -Symptoms show no change -previous treatments: cough drops, mucinex -sick contacts/travel/risks: denies flu exposure.   ROS-denies fever, SOB, NVD, tooth pain. Wheeze has resolved  Pertinent Past Medical History-  Patient Active Problem List   Diagnosis Date Noted  . TOBACCO ABUSE 09/30/2008    Priority: High  . Adjustment disorder with mixed anxiety and depressed mood 08/17/2012    Priority: Medium  . Hot flashes 03/23/2012    Priority: Medium  . GERD 08/28/2009    Priority: Medium  . Abnormal glucose 08/28/2009    Priority: Medium  . PARESTHESIA 11/11/2008    Priority: Medium  . Hyperlipemia 09/30/2008    Priority: Medium  . Osteopenia 09/30/2008    Priority: Medium  . OSA (obstructive sleep apnea) 01/18/2013    Priority: Low  . History of colonic polyps 11/11/2008    Priority: Low  . Benign paroxysmal positional vertigo 10/25/2008    Priority: Low  . 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 Leukocytosis 05/23/2016    Medications- reviewed  Current Outpatient Prescriptions  Medication Sig Dispense Refill  . citalopram (CELEXA) 20 MG tablet TAKE 1 TABLET (20 MG TOTAL) BY MOUTH DAILY. 90 tablet 0  . esomeprazole (NEXIUM) 40 MG capsule Take 1 capsule (40 mg total) by mouth 2 (two) times daily. 180 capsule 1  . mometasone (NASONEX) 50 MCG/ACT nasal spray Place 2 sprays into the nose daily. (Patient taking differently: Place 2 sprays into the nose daily as needed. ) 17 g 12  . pravastatin (PRAVACHOL) 40 MG tablet Take 1 tablet (40 mg total) by mouth daily. 100 tablet 3  . doxycycline (VIBRA-TABS) 100 MG tablet  Take 1 tablet (100 mg total) by mouth 2 (two) times daily. 14 tablet 0   No current facility-administered medications for this visit.     Objective: BP 136/84   Pulse 69   Temp 98 F (36.7 C) (Oral)   Ht 5' 4.5" (1.638 m)   Wt 210 lb 6.4 oz (95.4 kg)   SpO2 94%   BMI 35.56 kg/m  Gen: NAD, resting comfortably HEENT: Turbinates erythematous with yellow drainage, TM normal, pharynx mildly erythematous with no tonsilar exudate or edema, minimal sinus tenderness CV: RRR no murmurs rubs or gallops Lungs: CTAB no crackles, wheeze, rhonchi Ext: no edema Skin: warm, dry, no rash Neuro: grossly normal, moves all extremities  Assessment/Plan:  Sinsusitis Bacterial based on: Symptoms >10 days, double sickening, or severe symptoms in first 3 days  Treatment: -considered steroid: we opted out as recently treated -other symptomatic care with mucinex -Antibiotic indicated: yes  Finally, we reviewed reasons to return to care including if symptoms worsen or persist or new concerns arise (particularly fever or shortness of breath)  Meds ordered this encounter  Medications  . doxycycline (VIBRA-TABS) 100 MG tablet    Sig: Take 1 tablet (100 mg total) by mouth 2 (two) times daily.    Dispense:  14 tablet    Refill:  0   Also discussed Elevated blood pressure reading S:Home BP cuff 172/91. BP in office initial 144/78.  On repeat about 30 points different systolic and 10mg  diastolic.  Home readings even at pharmacy in  150s.  A/P: we discussed prior BPs were controlled until ill. May be illness related. Will follow up after feeling better in about next 2 months. We discussed dash eating plan and diet may help as well- I am not convinced this is true hypertension though.    [name], MD

## 2016-08-20 NOTE — Patient Instructions (Signed)
Appear to have sinus infection based on history and exam. Also seem to have a bronchitis but this seems to be slowly improving.   Blood pressure slightly high today and has been high since ill. Lets follow up 2 months and if still high when feeling well- consider medication. In mealtime work on eating plan below which can help. Weight loss can also help as can regular exercise. Goal blood pressure <140/90.   DASH Eating Plan DASH stands for "Dietary Approaches to Stop Hypertension." The DASH eating plan is a healthy eating plan that has been shown to reduce high blood pressure (hypertension). Additional health benefits may include reducing the risk of type 2 diabetes mellitus, heart disease, and stroke. The DASH eating plan may also help with weight loss. What do I need to know about the DASH eating plan? For the DASH eating plan, you will follow these general guidelines:  Choose foods with less than 150 milligrams of sodium per serving (as listed on the food label).  Use salt-free seasonings or herbs instead of table salt or sea salt.  Check with your health care provider or pharmacist before using salt substitutes.  Eat lower-sodium products. These are often labeled as "low-sodium" or "no salt added."  Eat fresh foods. Avoid eating a lot of canned foods.  Eat more vegetables, fruits, and low-fat dairy products.  Choose whole grains. Look for the word "whole" as the first word in the ingredient list.  Choose fish and skinless chicken or [place] more often than red meat. Limit fish, poultry, and meat to 6 oz (170 g) each day.  Limit sweets, desserts, sugars, and sugary drinks.  Choose heart-healthy fats.  Eat more home-cooked food and less restaurant, buffet, and fast food.  Limit fried foods.  Do not fry foods. Cook foods using methods such as baking, boiling, grilling, and broiling instead.  When eating at a restaurant, ask that your food be prepared with less salt, or no salt if  possible. What foods can I eat? Seek help from a dietitian for individual calorie needs. Grains  Whole grain or whole wheat bread. Brown rice. Whole grain or whole wheat pasta. Quinoa, bulgur, and whole grain cereals. Low-sodium cereals. Corn or whole wheat flour tortillas. Whole grain cornbread. Whole grain crackers. Low-sodium crackers. Vegetables  Fresh or frozen vegetables (raw, steamed, roasted, or grilled). Low-sodium or reduced-sodium tomato and vegetable juices. Low-sodium or reduced-sodium tomato sauce and paste. Low-sodium or reduced-sodium canned vegetables. Fruits  All fresh, canned (in natural juice), or frozen fruits. Meat and Other Protein Products  Ground beef (85% or leaner), grass-fed beef, or beef trimmed of fat. Skinless chicken or [place]. Ground chicken or [place]. Pork trimmed of fat. All fish and seafood. Eggs. Dried beans, peas, or lentils. Unsalted nuts and seeds. Unsalted canned beans. Dairy  Low-fat dairy products, such as skim or 1% milk, 2% or reduced-fat cheeses, low-fat ricotta or cottage cheese, or plain low-fat yogurt. Low-sodium or reduced-sodium cheeses. Fats and Oils  Tub margarines without trans fats. Light or reduced-fat mayonnaise and salad dressings (reduced sodium). Avocado. Safflower, olive, or canola oils. Natural peanut or almond butter. Other  Unsalted popcorn and pretzels. The items listed above may not be a complete list of recommended foods or beverages. Contact your dietitian for more options.  What foods are not recommended? Grains  White bread. White pasta. White rice. Refined cornbread. Bagels and croissants. Crackers that contain trans fat. Vegetables  Creamed or fried vegetables. Vegetables in a cheese sauce. Regular  canned vegetables. Regular canned tomato sauce and paste. Regular tomato and vegetable juices. Fruits  Canned fruit in light or heavy syrup. Fruit juice. Meat and Other Protein Products  Fatty cuts of meat. Ribs, chicken  wings, bacon, sausage, bologna, salami, chitterlings, fatback, hot dogs, bratwurst, and packaged luncheon meats. Salted nuts and seeds. Canned beans with salt. Dairy  Whole or 2% milk, cream, half-and-half, and cream cheese. Whole-fat or sweetened yogurt. Full-fat cheeses or blue cheese. Nondairy creamers and whipped toppings. Processed cheese, cheese spreads, or cheese curds. Condiments  Onion and garlic salt, seasoned salt, table salt, and sea salt. Canned and packaged gravies. Worcestershire sauce. Tartar sauce. Barbecue sauce. Teriyaki sauce. Soy sauce, including reduced sodium. Steak sauce. Fish sauce. Oyster sauce. Cocktail sauce. Horseradish. Ketchup and mustard. Meat flavorings and tenderizers. Bouillon cubes. Hot sauce. Tabasco sauce. Marinades. Taco seasonings. Relishes. Fats and Oils  Butter, stick margarine, lard, shortening, ghee, and bacon fat. Coconut, palm kernel, or palm oils. Regular salad dressings. Other  Pickles and olives. Salted popcorn and pretzels. The items listed above may not be a complete list of foods and beverages to avoid. Contact your dietitian for more information.  Where can I find more information? National Heart, Lung, and Blood Institute: travelstabloid.com This information is not intended to replace advice given to you by your health care provider. Make sure you discuss any questions you have with your health care provider. Document Released: 06/20/2011 Document Revised: 12/07/2015 Document Reviewed: 05/05/2013 Elsevier Interactive Patient Education  2017 [employer].

## 2016-08-20 NOTE — Progress Notes (Signed)
Pre visit review using our clinic review tool, if applicable. No additional management support is needed unless otherwise documented below in the visit note. 

## 2016-08-20 NOTE — Assessment & Plan Note (Signed)
S:Home BP cuff 172/91. BP in office initial 144/78.  On repeat about 30 points different systolic and 10mg  diastolic.  Home readings even at pharmacy in 150s.  A/P: we discussed prior BPs were controlled until ill. May be illness related. Will follow up after feeling better in about next 2 months. We discussed dash eating plan and diet may help as well- I am not convinced this is true hypertension though.

## 2016-08-28 DIAGNOSIS — D485 Neoplasm of uncertain behavior of skin: Secondary | ICD-10-CM | POA: Diagnosis not present

## 2016-08-28 DIAGNOSIS — L570 Actinic keratosis: Secondary | ICD-10-CM | POA: Diagnosis not present

## 2016-08-28 DIAGNOSIS — C44622 Squamous cell carcinoma of skin of right upper limb, including shoulder: Secondary | ICD-10-CM | POA: Diagnosis not present

## 2016-08-28 DIAGNOSIS — T07XXXA Unspecified multiple injuries, initial encounter: Secondary | ICD-10-CM | POA: Diagnosis not present

## 2016-09-11 DIAGNOSIS — L538 Other specified erythematous conditions: Secondary | ICD-10-CM | POA: Diagnosis not present

## 2016-09-11 DIAGNOSIS — L859 Epidermal thickening, unspecified: Secondary | ICD-10-CM | POA: Diagnosis not present

## 2016-09-11 DIAGNOSIS — D0461 Carcinoma in situ of skin of right upper limb, including shoulder: Secondary | ICD-10-CM | POA: Diagnosis not present

## 2016-09-12 ENCOUNTER — Encounter: Payer: Self-pay | Admitting: Family Medicine

## 2016-09-12 DIAGNOSIS — Z85828 Personal history of other malignant neoplasm of skin: Secondary | ICD-10-CM | POA: Insufficient documentation

## 2016-09-17 ENCOUNTER — Other Ambulatory Visit: Payer: Self-pay | Admitting: Family Medicine

## 2016-10-10 ENCOUNTER — Other Ambulatory Visit: Payer: Self-pay | Admitting: Family Medicine

## 2016-10-10 DIAGNOSIS — K219 Gastro-esophageal reflux disease without esophagitis: Secondary | ICD-10-CM

## 2016-12-03 DIAGNOSIS — L820 Inflamed seborrheic keratosis: Secondary | ICD-10-CM | POA: Diagnosis not present

## 2016-12-03 DIAGNOSIS — D485 Neoplasm of uncertain behavior of skin: Secondary | ICD-10-CM | POA: Diagnosis not present

## 2016-12-10 ENCOUNTER — Telehealth: Payer: Self-pay

## 2016-12-10 NOTE — Telephone Encounter (Signed)
Received PA request for Nexium. PA submitted & pending. Key: [id number]

## 2016-12-12 ENCOUNTER — Other Ambulatory Visit: Payer: Self-pay | Admitting: Family Medicine

## 2016-12-12 DIAGNOSIS — K219 Gastro-esophageal reflux disease without esophagitis: Secondary | ICD-10-CM

## 2016-12-12 MED ORDER — ESOMEPRAZOLE MAGNESIUM 40 MG PO CPDR: 40.0000 mg | DELAYED_RELEASE_CAPSULE | Freq: Two times a day (BID) | ORAL | 2 refills | Status: DC

## 2017-02-08 ENCOUNTER — Other Ambulatory Visit: Payer: Self-pay | Admitting: Family Medicine

## 2017-02-08 DIAGNOSIS — K219 Gastro-esophageal reflux disease without esophagitis: Secondary | ICD-10-CM

## 2017-04-10 ENCOUNTER — Other Ambulatory Visit: Payer: Self-pay | Admitting: Family Medicine

## 2017-05-07 ENCOUNTER — Ambulatory Visit: Payer: BLUE CROSS/BLUE SHIELD

## 2017-05-21 ENCOUNTER — Other Ambulatory Visit: Payer: Self-pay

## 2017-05-21 MED ORDER — PRAVASTATIN SODIUM 40 MG PO TABS: 40.0000 mg | ORAL_TABLET | Freq: Every day | ORAL | 3 refills | Status: DC

## 2017-05-26 ENCOUNTER — Telehealth: Payer: Self-pay

## 2017-05-26 NOTE — Telephone Encounter (Signed)
PA for Nexium initiated through Cover My Meds.  Awaiting decision.

## 2017-05-26 NOTE — Telephone Encounter (Signed)
PA for Nexium 40 mg BID approved through 11/22/2017.

## 2017-06-11 ENCOUNTER — Other Ambulatory Visit: Payer: Self-pay

## 2017-06-11 ENCOUNTER — Other Ambulatory Visit: Payer: Self-pay | Admitting: *Deleted

## 2017-06-11 DIAGNOSIS — K219 Gastro-esophageal reflux disease without esophagitis: Secondary | ICD-10-CM

## 2017-06-11 MED ORDER — ESOMEPRAZOLE MAGNESIUM 40 MG PO CPDR: 40.0000 mg | DELAYED_RELEASE_CAPSULE | Freq: Two times a day (BID) | ORAL | 0 refills | Status: DC

## 2017-06-11 NOTE — Telephone Encounter (Signed)
Rx refill already done for Nexium.

## 2017-08-15 ENCOUNTER — Telehealth: Payer: Self-pay

## 2017-08-15 NOTE — Telephone Encounter (Signed)
Prior Authorization completed and faxed to [facility] for esomeprazole (NEXIUM) 40 MG capsule. Awaiting response at this time

## 2017-08-21 ENCOUNTER — Telehealth: Payer: Self-pay | Admitting: Family Medicine

## 2017-08-21 ENCOUNTER — Encounter: Payer: Self-pay | Admitting: Family Medicine

## 2017-08-21 MED ORDER — OMEPRAZOLE 40 MG PO CPDR: 40.0000 mg | DELAYED_RELEASE_CAPSULE | Freq: Every day | ORAL | 3 refills | Status: DC

## 2017-08-21 NOTE — Telephone Encounter (Signed)
[name] [name],  Nexium was denied from insurance for prior authorization.   I sent in omeprazole for you to try at 40mg   [name]

## 2017-10-01 ENCOUNTER — Other Ambulatory Visit: Payer: Self-pay

## 2017-10-01 DIAGNOSIS — K219 Gastro-esophageal reflux disease without esophagitis: Secondary | ICD-10-CM

## 2017-10-01 MED ORDER — ESOMEPRAZOLE MAGNESIUM 40 MG PO CPDR: 40.0000 mg | DELAYED_RELEASE_CAPSULE | Freq: Two times a day (BID) | ORAL | 0 refills | Status: DC

## 2017-10-03 ENCOUNTER — Other Ambulatory Visit: Payer: Self-pay | Admitting: Family Medicine

## 2017-10-03 DIAGNOSIS — J4520 Mild intermittent asthma, uncomplicated: Secondary | ICD-10-CM

## 2017-10-06 NOTE — Telephone Encounter (Signed)
Last seen over a year ago- lets have her come in

## 2017-10-08 NOTE — Telephone Encounter (Signed)
Called patient and left a voicemail message asking patient to return call and schedule an appointment

## 2017-10-08 NOTE — Telephone Encounter (Signed)
Copied from [place]. Topic: Quick Communication - Office Called Patient >> Oct 08, 2017 10:27 AM [place], [place], [place] wrote: Reason for [id number] a request from the pharmacy for  [place]. Patient has not been seen in over a year. Please schedule patient for an appointment

## 2017-10-08 NOTE — Telephone Encounter (Signed)
Attempted to contact pt regarding refill request; note dated 10/08/17 states the pt needs to schedule an appointment; left message for pt at [phone].

## 2017-10-10 ENCOUNTER — Other Ambulatory Visit: Payer: Self-pay

## 2017-10-10 DIAGNOSIS — F4323 Adjustment disorder with mixed anxiety and depressed mood: Secondary | ICD-10-CM

## 2017-10-10 MED ORDER — CITALOPRAM HYDROBROMIDE 20 MG PO TABS: 20.0000 mg | ORAL_TABLET | Freq: Every day | ORAL | 1 refills | Status: DC

## 2017-10-20 ENCOUNTER — Encounter: Payer: Self-pay | Admitting: Family Medicine

## 2017-10-20 ENCOUNTER — Ambulatory Visit: Payer: BLUE CROSS/BLUE SHIELD | Admitting: Family Medicine

## 2017-10-20 VITALS — BP 146/92 | HR 61 | Temp 98.3°F | Ht 64.5 in | Wt 212.6 lb

## 2017-10-20 DIAGNOSIS — I10 Essential (primary) hypertension: Secondary | ICD-10-CM | POA: Diagnosis not present

## 2017-10-20 DIAGNOSIS — Z23 Encounter for immunization: Secondary | ICD-10-CM | POA: Diagnosis not present

## 2017-10-20 DIAGNOSIS — F17200 Nicotine dependence, unspecified, uncomplicated: Secondary | ICD-10-CM

## 2017-10-20 MED ORDER — AMLODIPINE BESYLATE 2.5 MG PO TABS: 2.5000 mg | ORAL_TABLET | Freq: Every day | ORAL | 3 refills | Status: DC

## 2017-10-20 NOTE — Patient Instructions (Signed)
Start amlodipine 2.5mg   See me for next available physical. Come fasting but take all medicines with water   DASH Eating Plan DASH stands for "Dietary Approaches to Stop Hypertension." The DASH eating plan is a healthy eating plan that has been shown to reduce high blood pressure (hypertension). It may also reduce your risk for type 2 diabetes, heart disease, and stroke. The DASH eating plan may also help with weight loss. What are tips for following this plan? General guidelines  Avoid eating more than 2,300 mg (milligrams) of salt (sodium) a day. If you have hypertension, you may need to reduce your sodium intake to 1,500 mg a day.  Limit alcohol intake to no more than 1 drink a day for nonpregnant women and 2 drinks a day for men. One drink equals 12 oz of beer, 5 oz of wine, or 1 oz of hard liquor.  Work with your health care provider to maintain a healthy body weight or to lose weight. Ask what an ideal weight is for you.  Get at least 30 minutes of exercise that causes your heart to beat faster (aerobic exercise) most days of the week. Activities may include walking, swimming, or biking.  Work with your health care provider or diet and nutrition specialist (dietitian) to adjust your eating plan to your individual calorie needs. Reading food labels  Check food labels for the amount of sodium per serving. Choose foods with less than 5 percent of the Daily Value of sodium. Generally, foods with less than 300 mg of sodium per serving fit into this eating plan.  To find whole grains, look for the word "whole" as the first word in the ingredient list. Shopping  Buy products labeled as "low-sodium" or "no salt added."  Buy fresh foods. Avoid canned foods and premade or frozen meals. Cooking  Avoid adding salt when cooking. Use salt-free seasonings or herbs instead of table salt or sea salt. Check with your health care provider or pharmacist before using salt substitutes.  Do not fry  foods. Cook foods using healthy methods such as baking, boiling, grilling, and broiling instead.  Cook with heart-healthy oils, such as olive, canola, soybean, or sunflower oil. Meal planning   Eat a balanced diet that includes: ? 5 or more servings of fruits and vegetables each day. At each meal, try to fill half of your plate with fruits and vegetables. ? Up to 6-8 servings of whole grains each day. ? Less than 6 oz of lean meat, poultry, or fish each day. A 3-oz serving of meat is about the same size as a deck of cards. One egg equals 1 oz. ? 2 servings of low-fat dairy each day. ? A serving of nuts, seeds, or beans 5 times each week. ? Heart-healthy fats. Healthy fats called Omega-3 fatty acids are found in foods such as flaxseeds and coldwater fish, like sardines, salmon, and mackerel.  Limit how much you eat of the following: ? Canned or prepackaged foods. ? Food that is high in trans fat, such as fried foods. ? Food that is high in saturated fat, such as fatty meat. ? Sweets, desserts, sugary drinks, and other foods with added sugar. ? Full-fat dairy products.  Do not salt foods before eating.  Try to eat at least 2 vegetarian meals each week.  Eat more home-cooked food and less restaurant, buffet, and fast food.  When eating at a restaurant, ask that your food be prepared with less salt or no salt, if  possible. What foods are recommended? The items listed may not be a complete list. Talk with your dietitian about what dietary choices are best for you. Grains Whole-grain or whole-wheat bread. Whole-grain or whole-wheat pasta. Brown rice. [place]. Bulgur. Whole-grain and low-sodium cereals. Pita bread. Low-fat, low-sodium crackers. Whole-wheat flour tortillas. Vegetables Fresh or frozen vegetables (raw, steamed, roasted, or grilled). Low-sodium or reduced-sodium tomato and vegetable juice. Low-sodium or reduced-sodium tomato sauce and tomato paste. Low-sodium or  reduced-sodium canned vegetables. Fruits All fresh, dried, or frozen fruit. Canned fruit in natural juice (without added sugar). Meat and other protein foods Skinless chicken or [place]. Ground chicken or [place]. Pork with fat trimmed off. Fish and seafood. Egg whites. Dried beans, peas, or lentils. Unsalted nuts, nut butters, and seeds. Unsalted canned beans. Lean cuts of beef with fat trimmed off. Low-sodium, lean deli meat. Dairy Low-fat (1%) or fat-free (skim) milk. Fat-free, low-fat, or reduced-fat cheeses. Nonfat, low-sodium ricotta or cottage cheese. Low-fat or nonfat yogurt. Low-fat, low-sodium cheese. Fats and oils Soft margarine without trans fats. Vegetable oil. Low-fat, reduced-fat, or light mayonnaise and salad dressings (reduced-sodium). Canola, safflower, olive, soybean, and sunflower oils. Avocado. Seasoning and other foods Herbs. Spices. Seasoning mixes without salt. Unsalted popcorn and pretzels. Fat-free sweets. What foods are not recommended? The items listed may not be a complete list. Talk with your dietitian about what dietary choices are best for you. Grains Baked goods made with fat, such as croissants, muffins, or some breads. Dry pasta or rice meal packs. Vegetables Creamed or fried vegetables. Vegetables in a cheese sauce. Regular canned vegetables (not low-sodium or reduced-sodium). Regular canned tomato sauce and paste (not low-sodium or reduced-sodium). Regular tomato and vegetable juice (not low-sodium or reduced-sodium). Angie Fava. Olives. Fruits Canned fruit in a light or heavy syrup. Fried fruit. Fruit in cream or butter sauce. Meat and other protein foods Fatty cuts of meat. Ribs. Fried meat. Berniece Salines. Sausage. Bologna and other processed lunch meats. Salami. Fatback. Hotdogs. Bratwurst. Salted nuts and seeds. Canned beans with added salt. Canned or smoked fish. Whole eggs or egg yolks. Chicken or [place] with skin. Dairy Whole or 2% milk, cream, and half-and-half.  Whole or full-fat cream cheese. Whole-fat or sweetened yogurt. Full-fat cheese. Nondairy creamers. Whipped toppings. Processed cheese and cheese spreads. Fats and oils Butter. Stick margarine. Lard. Shortening. Ghee. Bacon fat. Tropical oils, such as coconut, palm kernel, or palm oil. Seasoning and other foods Salted popcorn and pretzels. Onion salt, garlic salt, seasoned salt, table salt, and sea salt. Worcestershire sauce. Tartar sauce. Barbecue sauce. Teriyaki sauce. Soy sauce, including reduced-sodium. Steak sauce. Canned and packaged gravies. Fish sauce. Oyster sauce. Cocktail sauce. Horseradish that you find on the shelf. Ketchup. Mustard. Meat flavorings and tenderizers. Bouillon cubes. Hot sauce and Tabasco sauce. Premade or packaged marinades. Premade or packaged taco seasonings. Relishes. Regular salad dressings. Where to find more information:  National Heart, Lung, and [place]: https://wilson-eaton.com/  American Heart Association: www.heart.org Summary  The DASH eating plan is a healthy eating plan that has been shown to reduce high blood pressure (hypertension). It may also reduce your risk for type 2 diabetes, heart disease, and stroke.  With the DASH eating plan, you should limit salt (sodium) intake to 2,300 mg a day. If you have hypertension, you may need to reduce your sodium intake to 1,500 mg a day.  When on the DASH eating plan, aim to eat more fresh fruits and vegetables, whole grains, lean proteins, low-fat dairy, and heart-healthy fats.  Work with your health care provider or diet and nutrition specialist (dietitian) to adjust your eating plan to your individual calorie needs. This information is not intended to replace advice given to you by your health care provider. Make sure you discuss any questions you have with your health care provider. Document Released: 06/20/2011 Document Revised: 06/24/2016 Document Reviewed: 06/24/2016 Elsevier Interactive Patient Education   [employer].

## 2017-10-20 NOTE — Progress Notes (Signed)
Subjective:  [name] [name] is a 62 y.o. year old very pleasant female patient who presents for/with See problem oriented charting ROS- flushed face. No chest pain or shortness of breath. No abdominal pain, nausea, vomiting   Past Medical History-  Patient Active Problem List   Diagnosis Date Noted  . TOBACCO ABUSE 09/30/2008    Priority: High  . History of squamous cell carcinoma of skin 09/12/2016    Priority: Medium  . Adjustment disorder with mixed anxiety and depressed mood 08/17/2012    Priority: Medium  . Hot flashes 03/23/2012    Priority: Medium  . GERD 08/28/2009    Priority: Medium  . Abnormal glucose 08/28/2009    Priority: Medium  . PARESTHESIA 11/11/2008    Priority: Medium  . Hyperlipemia 09/30/2008    Priority: Medium  . Osteopenia 09/30/2008    Priority: Medium  . OSA (obstructive sleep apnea) 01/18/2013    Priority: Low  . History of colonic polyps 11/11/2008    Priority: Low  . Benign paroxysmal positional vertigo 10/25/2008    Priority: Low  . Hypertension 10/20/2017  . Elevated blood pressure reading 08/20/2016  . 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 Leukocytosis 05/23/2016    Medications- reviewed and updated Current Outpatient Medications  Medication Sig Dispense Refill  . citalopram (CELEXA) 20 MG tablet Take 1 tablet (20 mg total) by mouth daily. 90 tablet 1  . esomeprazole (NEXIUM) 40 MG capsule Take 1 capsule (40 mg total) by mouth 2 (two) times daily. 180 capsule 0   No current facility-administered medications for this visit.     Objective: BP (!) 146/92 (BP Location: Left Arm, Patient Position: Sitting, Cuff Size: Large)   Pulse 61   Temp 98.3 F (36.8 C) (Oral)   Ht 5' 4.5" (1.638 m)   Wt 212 lb 9.6 oz (96.4 kg)   SpO2 94%   BMI 35.93 kg/m  Gen: NAD, resting comfortably CV: RRR no murmurs rubs or gallops Lungs: CTAB no crackles, wheeze, rhonchi Abdomen: soft/obese Ext: trace edema Skin: warm, dry Neuro: normal  gait  Assessment/Plan:  Hypertension S:  Patient has had feelings of being hot while at work. Felt like "heat was generating from me". Felt slightly off balance. Apparently looked flushed and [facility]- BP was in 159/93. Also has had some spot checks from time to time and there have been some elevations. We reviewed prior BPs which overall are either high normal or in hypertension range- enough to diagnose with hypertension. Hasnt had much to drink today.  BP Readings from Last 3 Encounters:  10/20/17 (!) 146/92  08/20/16 136/84  08/05/16 (!) 144/82  A/P: new diagnosis hypertension. Discussed quitting smoking, dash eating program, regular exercise as options to get BP down. She is concerned about her motivation for these. Does agree to take amlodipine and read over information for DASH as well as come back for CPE at next available. Do wonder if what she is feeling was from high blood pressure this morning- will get baseline labs regardless at follow up. Advised patient no sudafed which she commonly has used in past for sinus issues- can try flonase, tylenol, zyrtec if needed   Lab/Order associations: Need for prophylactic vaccination with combined diphtheria-tetanus-pertussis (DTP) vaccine - Plan: Tdap vaccine greater than or equal to 7yo IM  Meds ordered this encounter  Medications  . amLODipine (NORVASC) 2.5 MG tablet    Sig: Take 1 tablet (2.5 mg total) by mouth daily.    Dispense:  30 tablet    Refill:  3    Return precautions advised.  [name], MD

## 2017-10-20 NOTE — Assessment & Plan Note (Signed)
S:  Patient has had feelings of being hot while at work. Felt like "heat was generating from me". Felt slightly off balance. Apparently looked flushed and [facility]- BP was in 159/93. Also has had some spot checks from time to time and there have been some elevations. We reviewed prior BPs which overall are either high normal or in hypertension range- enough to diagnose with hypertension. Hasnt had much to drink today.  BP Readings from Last 3 Encounters:  10/20/17 (!) 146/92  08/20/16 136/84  08/05/16 (!) 144/82  A/P: new diagnosis hypertension. Discussed quitting smoking, dash eating program, regular exercise as options to get BP down. She is concerned about her motivation for these. Does agree to take amlodipine and read over information for DASH as well as come back for CPE at next available. Do wonder if what she is feeling was from high blood pressure this morning- will get baseline labs regardless at follow up. Advised patient no sudafed which she commonly has used in past for sinus issues- can try flonase, tylenol, zyrtec if needed

## 2017-11-11 ENCOUNTER — Telehealth: Payer: Self-pay | Admitting: Family Medicine

## 2017-11-11 NOTE — Telephone Encounter (Unsigned)
Copied from [place] [id number]. Topic: Quick Communication - Rx Refill/Question >> Nov 11, 2017  2:40 PM Neva Seat wrote: esomeprazole (NEXIUM) 40 MG capsule  Pt needing a refill on this.  [facility] - Pt is almost will need asap - going out of town on [place].   [id number] [place], [place] - [address] [address] Phone: [phone] Fax: [phone]

## 2017-11-12 NOTE — Telephone Encounter (Signed)
Called patient and let her know that I called the pharmacy and they also tried to run the prescription through as a 30 day instead of a 90 day. This too was denied. I called the patient and let her know and also let her know that we had filed a PA this morning that was denied. I told her we will file an appeal but we are unsure of how long the response time will be. She verbalized understanding

## 2017-11-12 NOTE — Telephone Encounter (Signed)
See note

## 2017-11-12 NOTE — Telephone Encounter (Signed)
Attempted PA.  Denied again due to previous denial from February being on file.  Can attempt appeal, but this will take some time.

## 2017-11-12 NOTE — Telephone Encounter (Signed)
Per My Chart message dated 08/21/2017:  Nexium was denied from insurance for prior authorization.   I sent in omeprazole for you to try at 40mg 

## 2017-11-12 NOTE — Telephone Encounter (Signed)
[name] [name] - [phone] [facility] called in to discuss and receive further assistance with PA for pt's medication.

## 2017-11-12 NOTE — Telephone Encounter (Signed)
Called and spoke with Raquel Sarna at [facility] who states that she called the insurance company and was able to get a refill approved today. She did state that the PA is no longer valid after 11/22/17. I told her we had filed a PA this morning and an appeal. She verbalized understanding waiting results of appeal.

## 2017-11-12 NOTE — Telephone Encounter (Signed)
Notified [facility] [phone] regarding last refill for this patient, for esomeprazole 40 mg capsule.  They stated that they needed prior authorization for this med.   LOV  10/20/17 NOV 12/18/17 Provider  S. Yong Channel, MD  [facility] [phone]  [address]  Please review.

## 2017-11-14 ENCOUNTER — Telehealth: Payer: Self-pay

## 2017-11-14 NOTE — Telephone Encounter (Signed)
Received record that Nexium 40mg  Capsule has been approved for patient from 10/13/2017 through 11/12/2018

## 2017-12-18 ENCOUNTER — Ambulatory Visit (INDEPENDENT_AMBULATORY_CARE_PROVIDER_SITE_OTHER): Payer: BLUE CROSS/BLUE SHIELD | Admitting: Family Medicine

## 2017-12-18 ENCOUNTER — Encounter: Payer: Self-pay | Admitting: Family Medicine

## 2017-12-18 VITALS — BP 126/84 | HR 79 | Temp 98.3°F | Ht 64.5 in | Wt 215.2 lb

## 2017-12-18 DIAGNOSIS — F4323 Adjustment disorder with mixed anxiety and depressed mood: Secondary | ICD-10-CM | POA: Diagnosis not present

## 2017-12-18 DIAGNOSIS — F17200 Nicotine dependence, unspecified, uncomplicated: Secondary | ICD-10-CM

## 2017-12-18 DIAGNOSIS — M546 Pain in thoracic spine: Secondary | ICD-10-CM

## 2017-12-18 DIAGNOSIS — I10 Essential (primary) hypertension: Secondary | ICD-10-CM

## 2017-12-18 DIAGNOSIS — E785 Hyperlipidemia, unspecified: Secondary | ICD-10-CM | POA: Diagnosis not present

## 2017-12-18 DIAGNOSIS — Z0001 Encounter for general adult medical examination with abnormal findings: Secondary | ICD-10-CM | POA: Diagnosis not present

## 2017-12-18 DIAGNOSIS — K219 Gastro-esophageal reflux disease without esophagitis: Secondary | ICD-10-CM

## 2017-12-18 LAB — POC URINALSYSI DIPSTICK (AUTOMATED)
Bilirubin, UA: 1
Blood, UA: NEGATIVE
GLUCOSE UA: NEGATIVE
KETONES UA: NEGATIVE
Nitrite, UA: NEGATIVE
Protein, UA: NEGATIVE
SPEC GRAV UA: 1.025 (ref 1.010–1.025)
Urobilinogen, UA: 0.2 E.U./dL
pH, UA: 6 (ref 5.0–8.0)

## 2017-12-18 MED ORDER — PREDNISONE 20 MG PO TABS: ORAL_TABLET | ORAL | 0 refills | Status: DC

## 2017-12-18 MED ORDER — CITALOPRAM HYDROBROMIDE 10 MG PO TABS: 10.0000 mg | ORAL_TABLET | Freq: Every day | ORAL | 3 refills | Status: DC

## 2017-12-18 MED ORDER — ESOMEPRAZOLE MAGNESIUM 40 MG PO CPDR: 40.0000 mg | DELAYED_RELEASE_CAPSULE | Freq: Every day | ORAL | 3 refills | Status: DC

## 2017-12-18 NOTE — Addendum Note (Signed)
Addended by: Rene Kocher on: 12/18/2017 03:36 PM   Modules accepted: Orders

## 2017-12-18 NOTE — Progress Notes (Signed)
Phone: [phone]  Subjective:  Patient presents today for their annual physical. Chief complaint-noted.   See problem oriented charting- ROS- full  review of systems was completed and negative except for: decreased urination, back pain, seasonal allergies  The following were reviewed and entered/updated in epic: Past Medical History:  Diagnosis Date  . Abnormal glucose   . Anxiety   . Esophageal stricture    Dr.Dora Olevia Perches  . GERD (gastroesophageal reflux disease)   . History of colonic polyps   . Hyperlipidemia   . MVP (mitral valve prolapse)    history of   . Osteoporosis   . Tobacco abuse    tried Chantix- caused agression   Patient Active Problem List   Diagnosis Date Noted  . TOBACCO ABUSE 09/30/2008    Priority: High  . Hypertension 10/20/2017    Priority: Medium  . History of squamous cell carcinoma of skin 09/12/2016    Priority: Medium  . 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 Adjustment disorder with mixed anxiety and depressed mood 08/17/2012    Priority: Medium  . Hot flashes 03/23/2012    Priority: Medium  . GERD 08/28/2009    Priority: Medium  . Abnormal glucose 08/28/2009    Priority: Medium  . PARESTHESIA 11/11/2008    Priority: Medium  . Hyperlipemia 09/30/2008    Priority: Medium  . Osteopenia 09/30/2008    Priority: Medium  . OSA (obstructive sleep apnea) 01/18/2013    Priority: Low  . History of colonic polyps 11/11/2008    Priority: Low  . Benign paroxysmal positional vertigo 10/25/2008    Priority: Low   Past Surgical History:  Procedure Laterality Date  . APPENDECTOMY  2008  . BREAST BIOPSY  2000    Family History  Problem Relation Age of Onset  . Hyperlipidemia Unknown   . Hiatal hernia Unknown        several family members  . Hypertension Mother   . Cancer Mother        apendix. right hemicolectomy  . Alcohol abuse Father   . Cirrhosis Father        deceased form cirrhosis  .  Hypertension Father   . Other Unknown        Fh of abnormal glucose  . Hypertension Sister   . Hypertension Brother     Medications- reviewed and updated Current Outpatient Medications  Medication Sig Dispense Refill  . amLODipine (NORVASC) 2.5 MG tablet Take 1 tablet (2.5 mg total) by mouth daily. 30 tablet 3  . citalopram (CELEXA) 20 MG tablet Take 1 tablet (20 mg total) by mouth daily. 90 tablet 1  . esomeprazole (NEXIUM) 40 MG capsule Take 1 capsule (40 mg total) by mouth 2 (two) times daily. 180 capsule 0   No current facility-administered medications for this visit.     Allergies-reviewed and updated Allergies  Allergen Reactions  . Penicillins Other (See Comments)    whelps, ,skin hot to touch    Social History   Social History Narrative   Married over 30 years. Sons 29,35 in 2017. 2 grandkis- 1 grandaughter - Bolivar Haw . 1 grandson. Hudson lewis. All grandkids same son      Cares for mother (drives back and forth to help), husband alcoholic- tremendous stress. Sister stays with mom- stressor      Occupation: office admin - [employer]     Objective: BP 126/84 (BP Location: Left Arm, Patient Position: Sitting, Cuff Size: Normal)  Pulse 79   Temp 98.3 F (36.8 C) (Oral)   Ht 5' 4.5" (1.638 m)   Wt 215 lb 3.2 oz (97.6 kg)   SpO2 93%   BMI 36.37 kg/m  Gen: NAD, resting comfortably HEENT: Mucous membranes are moist. Oropharynx normal Neck: no thyromegaly CV: RRR no murmurs rubs or gallops Lungs: CTAB no crackles, wheeze, rhonchi Abdomen: soft/nontender/nondistended/normal bowel sounds. No rebound or guarding.  Ext: no edema Skin: warm, dry Neuro: grossly normal, moves all extremities, PERRLA  Assessment/Plan:  62 y.o. female presenting for annual physical.  Health Maintenance counseling: 1. Anticipatory guidance: Patient counseled regarding regular dental exams -q6 months, eye exams - yearly or so, wearing seatbelts.  2. Risk factor reduction:   Advised patient of need for regular exercise and diet rich and fruits and vegetables to reduce risk of heart attack and stroke. Exercise- not at present- advised 150 minutes a week. Diet-admits she could improve her intake- she states trying to do a better job with cutting down on salt. Loves ice cream, hot dogs- over dose both of these. also drinking hot chocolate.  Wt Readings from Last 3 Encounters:  12/18/17 215 lb 3.2 oz (97.6 kg)  10/20/17 212 lb 9.6 oz (96.4 kg)  08/20/16 210 lb 6.4 oz (95.4 kg)  3. Immunizations/screenings/ancillary studies- - We discussed shingrix availability issues  as well as coverage issues - I recommended that patient get vaccine at the pharmacy  As we do not have available  Immunization History  Administered Date(s) Administered  . Influenza,inj,Quad PF,6+ Mos 04/29/2013, 06/01/2015, 05/02/2016  . Pneumococcal Polysaccharide-23 11/01/2012  . Td 02/03/2007  . Tdap 10/20/2017   Health Maintenance Due  Topic Date Due  . MAMMOGRAM - Patient will schedule appointment. 04/24/2013  . PAP SMEAR - Patient will schedule appointment. 09/21/2017   4. Cervical cancer screening- sees gyn- knows she is past due 5. Breast cancer screening-  breast exam planned with GYN and mammogram - wants ordered with them 6. Colon cancer screening - 08/19/2008- she is interested in cologuard - trying to work with insurance to get it covered. Hyperplastic polyp 2010 7. Skin cancer screening- no dermatologist regularly- saw one last year- not cancerous. advised regular sunscreen use. Denies worrisome, changing, or new skin lesions.  8. Birth control/STD check- monogomous/postmenopausal 9. Osteoporosis screening at 1- will plan on this 10. Smoker- 2 PPD. Refused lung cancer screening. Will get UA. She is not ready to quit but advised her to.   Status of chronic or acute concerns   Mid back pain that tends to migrate- has been going on for a month. Has taken as needed ibuprofen. Pain 2-3/10-  tends to bother her more at work. Shifting to try to find right position. Not getting better.  She reports excellent response to steroids in the past for musculoskeletal pain. Plan- "If you are not an increased risk for diabetes- I want you to trial prednisone for your back.  I want you to use heat 3-4 times a day for 20 minutes for the next 3 to 4 days.  If not improving I want you to see a massage therapist.  If still not better in 2 weeks I want to see you back-may refer you to Dr. Paulla Fore our sports medicine specialist."  Feels like flow of urine not as strong- still able to urinate though.   Seasonal allergies- not taking anything. Discussed cant take sudafed or other decongestants. Can take antihistamines like zyrtec.   Hypertension BP controlled on amlodipine  2.5mg   Hyperlipemia Update lipids-.suspect will need to start statin.  Will calculate 10-year risk  Adjustment disorder with mixed anxiety and depressed mood Despite multiple stressors-PHQ 9 of 3 today.  Reduce citalopram to 10 mg due to risk of QT prolongation.  No SI.  I would like to see her back in 2 months to check how she is doing.  GERD Ms. Shelden reports a choking sensation when she does not take Nexium twice a day.  She is on very high-dose Nexium at 40 mg twice daily.  I asked her to try once a day before dinner- if she has significant worsening of symptoms I want to get her plugged back into gastroenterology-last saw Dr. Olevia Perches   No future appointments. Return in about 8 weeks (around 02/12/2018).  Lab/Order associations: Encounter for general adult medical examination with abnormal findings - Plan: CBC, Comprehensive metabolic panel, Lipid panel, POCT Urinalysis Dipstick (Automated)  Essential hypertension - Plan: CBC, Comprehensive metabolic panel, Lipid panel  TOBACCO ABUSE - Plan: POCT Urinalysis Dipstick (Automated)  Hyperlipidemia, unspecified hyperlipidemia type - Plan: CBC, Comprehensive metabolic panel, Lipid  panel   Meds ordered this encounter  Medications  . esomeprazole (NEXIUM) 40 MG capsule    Sig: Take 1 capsule (40 mg total) by mouth daily before supper. If you dont do well with once a day- we need to have you see GI    Dispense:  90 capsule    Refill:  3  . predniSONE (DELTASONE) 20 MG tablet    Sig: Take 2 pills for 3 days, 1 pill for 4 days    Dispense:  10 tablet    Refill:  0  . citalopram (CELEXA) 10 MG tablet    Sig: Take 1 tablet (10 mg total) by mouth daily.    Dispense:  90 tablet    Refill:  3    Return precautions advised.  [name], MD

## 2017-12-18 NOTE — Patient Instructions (Addendum)
Health Maintenance Due  Topic Date Due  . MAMMOGRAM - Patient will schedule appointment. 04/24/2013  . PAP SMEAR - Patient will schedule appointment. 09/21/2017  PLEASE call to schedule these.   Please check with your pharmacy to see if they have the shingrix vaccine. If they do- please get this immunization and update [place] by phone call or mychart with dates you receive the vaccine  We will check on your cholesterol-suspect you will need cholesterol medicine  If you are not an increased risk for diabetes- I want you to trial prednisone for your back.  I want you to use heat 3-4 times a day for 20 minutes for the next 3 to 4 days.  If not improving I want you to see a massage therapist.  If still not better in 2 weeks I want to see you back-may refer you to Dr. Paulla Fore our sports medicine specialist.  [name] [name] reports a choking sensation when she does not take Nexium twice a day.  She is on very high-dose Nexium at 40 mg twice daily.  I asked her to try once a day before dinner- if she has significant worsening of symptoms I want to get her plugged back into gastroenterology-last saw Dr. Olevia Perches  Nexium at any dose also increases her risk of a dangerous heart rhythm when taking with citalopram above 10 mg-I reduced your citalopram to 10 mg and I want to see you back in 2 months.  If you have worsening symptoms of depression off of this please see [place] back sooner.

## 2017-12-18 NOTE — Assessment & Plan Note (Signed)
Update lipids-.suspect will need to start statin.  Will calculate 10-year risk

## 2017-12-18 NOTE — Assessment & Plan Note (Signed)
[name] [name] reports a choking sensation when she does not take Nexium twice a day.  She is on very high-dose Nexium at 40 mg twice daily.  I asked her to try once a day before dinner- if she has significant worsening of symptoms I want to get her plugged back into gastroenterology-last saw Dr. Olevia Perches

## 2017-12-18 NOTE — Assessment & Plan Note (Signed)
BP controlled on amlodipine 2.5mg 

## 2017-12-18 NOTE — Assessment & Plan Note (Addendum)
Despite multiple stressors-PHQ 9 of 3 today.  Reduce citalopram to 10 mg due to risk of QT prolongation.  No SI.  I would like to see her back in 2 months to check how she is doing.

## 2017-12-19 ENCOUNTER — Other Ambulatory Visit: Payer: Self-pay | Admitting: Family Medicine

## 2017-12-19 LAB — LDL CHOLESTEROL, DIRECT: Direct LDL: 190 mg/dL

## 2017-12-19 LAB — COMPREHENSIVE METABOLIC PANEL
ALBUMIN: 4.3 g/dL (ref 3.5–5.2)
ALT: 14 U/L (ref 0–35)
AST: 13 U/L (ref 0–37)
Alkaline Phosphatase: 111 U/L (ref 39–117)
BUN: 13 mg/dL (ref 6–23)
CALCIUM: 9.9 mg/dL (ref 8.4–10.5)
CHLORIDE: 100 meq/L (ref 96–112)
CO2: 27 mEq/L (ref 19–32)
CREATININE: 0.61 mg/dL (ref 0.40–1.20)
GFR: 106.1 mL/min (ref >60.00)
Glucose, Bld: 85 mg/dL (ref 70–99)
POTASSIUM: 4.2 meq/L (ref 3.5–5.1)
Sodium: 140 mEq/L (ref 135–145)
Total Bilirubin: 0.4 mg/dL (ref 0.2–1.2)
Total Protein: 7.4 g/dL (ref 6.0–8.3)

## 2017-12-19 LAB — CBC
HEMATOCRIT: 44.6 % (ref 36.0–46.0)
HEMOGLOBIN: 15.1 g/dL — AB (ref 12.0–15.0)
MCHC: 33.7 g/dL (ref 30.0–36.0)
MCV: 89.7 fl (ref 78.0–100.0)
PLATELETS: 240 10*3/uL (ref 150.0–400.0)
RBC: 4.97 Mil/uL (ref 3.87–5.11)
RDW: 14.3 % (ref 11.5–15.5)
WBC: 13.3 10*3/uL — ABNORMAL HIGH (ref 4.0–10.5)

## 2017-12-19 LAB — LIPID PANEL
CHOL/HDL RATIO: 7
CHOLESTEROL: 256 mg/dL — AB (ref 0–200)
HDL: 38.7 mg/dL — AB (ref >39.00)
NonHDL: 217.52
TRIGLYCERIDES: 238 mg/dL — AB (ref 0.0–149.0)
VLDL: 47.6 mg/dL — AB (ref 0.0–40.0)

## 2017-12-19 NOTE — Progress Notes (Signed)
Your CBC was stable (blood counts, infection fighting cells, platelets) with slightly high infection fighting cells- we should watch this each visit. If worsens in future- may need to get blood doctors opinion. Blood also slightly thick- quitting smoking owuld help with this.  Your CMET was normal (kidney, liver, and electrolytes, blood sugar).  Your cholesterol is far too high-team please send in atorvastatin 40 mg #90 with 3 refills for daily use

## 2017-12-22 ENCOUNTER — Other Ambulatory Visit: Payer: Self-pay

## 2017-12-22 MED ORDER — ATORVASTATIN CALCIUM 40 MG PO TABS: 40.0000 mg | ORAL_TABLET | Freq: Every day | ORAL | 3 refills | Status: DC

## 2018-01-14 ENCOUNTER — Other Ambulatory Visit: Payer: Self-pay

## 2018-01-14 MED ORDER — ESOMEPRAZOLE MAGNESIUM 40 MG PO CPDR: 40.0000 mg | DELAYED_RELEASE_CAPSULE | Freq: Every day | ORAL | 5 refills | Status: DC

## 2018-01-14 NOTE — Telephone Encounter (Signed)
Message sent to patients PCP Dr Yong Channel.

## 2018-01-14 NOTE — Telephone Encounter (Signed)
Will need to be changed to once daily

## 2018-01-14 NOTE — Telephone Encounter (Signed)
Change has been made to once daily.

## 2018-01-14 NOTE — Telephone Encounter (Signed)
May send in as once a day #30 with 5 refills. If she has issues with this dose- needs to be seen by GI

## 2018-01-19 ENCOUNTER — Telehealth: Payer: Self-pay | Admitting: Family Medicine

## 2018-01-19 DIAGNOSIS — K219 Gastro-esophageal reflux disease without esophagitis: Secondary | ICD-10-CM

## 2018-01-19 NOTE — Telephone Encounter (Signed)
See note

## 2018-01-19 NOTE — Telephone Encounter (Signed)
Copied from [place] [phone]. Topic: Quick Communication - See Telephone Encounter >> Jan 19, 2018  3:21 PM Synthia Innocent wrote: CRM for notification. See Telephone encounter for: 01/19/18. Patient states she is unable to generic of esomeprazole (NEXIUM) 40 MG capsule , needs brand name, takes 2 tablets 2x daily. [facility] [address]

## 2018-01-19 NOTE — Telephone Encounter (Signed)
Please refer her to GI under GERD. You may give 2 months of refills of BID dosing to allow her time to get in. Beyond that no more refills will be given. I strongly prefer she take this just once a day if possible

## 2018-01-19 NOTE — Telephone Encounter (Signed)
Dr. Yong Channel, please see message and advise of okay to refill per pt's directions? Looks like it is only ordered once a day per you.

## 2018-01-20 MED ORDER — ESOMEPRAZOLE MAGNESIUM 40 MG PO CPDR: 40.0000 mg | DELAYED_RELEASE_CAPSULE | Freq: Two times a day (BID) | ORAL | 1 refills | Status: DC

## 2018-01-20 NOTE — Telephone Encounter (Signed)
Left message on voicemail to call office. Rx sent to pharmacy.  

## 2018-01-20 NOTE — Telephone Encounter (Signed)
Left message on voicemail to call office.  

## 2018-01-21 ENCOUNTER — Encounter: Payer: Self-pay | Admitting: Gastroenterology

## 2018-01-21 NOTE — Telephone Encounter (Signed)
Patient called back stating her GI appointment is in September and I advised her that her script was sent to pharmacy.

## 2018-01-21 NOTE — Telephone Encounter (Signed)
Left message on voicemail to call office. Rx was sent to pharmacy and Referral to GI was placed someone will be contacting her to schedule appointment.

## 2018-01-22 NOTE — Telephone Encounter (Signed)
Noted  

## 2018-02-08 ENCOUNTER — Other Ambulatory Visit: Payer: Self-pay | Admitting: Family Medicine

## 2018-03-03 ENCOUNTER — Telehealth: Payer: Self-pay | Admitting: Family Medicine

## 2018-03-03 NOTE — Telephone Encounter (Signed)
I called and left a voicemail for patient to call to schedule as soon as possible.

## 2018-03-03 NOTE — Telephone Encounter (Signed)
-----   Message from [name], MD sent at 03/03/2018  8:46 AM EDT ----- Please convert to phone call. Patient was supposed to return in 2 months for follow up on change in depression medicine. She has not scheduled this. Can you please reach out to her to help her schedule this?  [name] [name]

## 2018-03-06 NOTE — Telephone Encounter (Signed)
Noted  

## 2018-03-24 ENCOUNTER — Encounter: Payer: Self-pay | Admitting: Gastroenterology

## 2018-03-24 ENCOUNTER — Ambulatory Visit: Payer: BLUE CROSS/BLUE SHIELD | Admitting: Gastroenterology

## 2018-03-24 VITALS — BP 112/72 | HR 78 | Ht 65.0 in | Wt 220.0 lb

## 2018-03-24 DIAGNOSIS — R197 Diarrhea, unspecified: Secondary | ICD-10-CM

## 2018-03-24 DIAGNOSIS — K219 Gastro-esophageal reflux disease without esophagitis: Secondary | ICD-10-CM

## 2018-03-24 DIAGNOSIS — R159 Full incontinence of feces: Secondary | ICD-10-CM

## 2018-03-24 DIAGNOSIS — Z800 Family history of malignant neoplasm of digestive organs: Secondary | ICD-10-CM | POA: Diagnosis not present

## 2018-03-24 MED ORDER — ESOMEPRAZOLE MAGNESIUM 40 MG PO CPDR: 40.0000 mg | DELAYED_RELEASE_CAPSULE | Freq: Two times a day (BID) | ORAL | 3 refills | Status: DC

## 2018-03-24 MED ORDER — NA SULFATE-K SULFATE-MG SULF 17.5-3.13-1.6 GM/177ML PO SOLN
1.0000 | Freq: Once | ORAL | 0 refills | Status: AC
Start: 2018-03-24 — End: 2018-03-24

## 2018-03-24 NOTE — Progress Notes (Signed)
Donte Kary [facility]    [id number]    1955/12/19  Primary Care Physician:Hunter, [name], MD  Referring Physician: [name], MD [place], [place] [address]  Chief complaint: GERD HPI: 62 year old female previously followed by Dr. Olevia Perches here to establish care she was last seen in 2010.  She has history of chronic gastroesophageal reflux disease and her symptoms are stable on Nexium 40 mg twice daily for past 10 years.  Prior to taking Nexium she has tried multiple other PPI and H2 blockers with persistent breakthrough heartburn She is having worsening episodes of diarrhea and leg cramps since she started Lipitor.  On further questioning patient was having intermittent episodes of diarrhea even prior to starting Lipitor.  She likes to drink milk, eats ice cream and consumes quite a bit of dairy products .diarrhea and leg cramps worse since started new cholesterol medication. Denies any dysphagia, odynophagia, nausea, vomiting, abdominal pain, melena or blood per rectum.  Colonoscopy August 19, 2008 by Dr. Olevia Perches with removal of diminutive polyp in sigmoid colon. EGD Nov 27, 2006 by Dr. 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 amLODipine (NORVASC) 2.5 MG tablet TAKE 1 TABLET BY MOUTH EVERY DAY  . atorvastatin (LIPITOR) 40 MG tablet Take 1 tablet (40 mg total) by mouth daily.  . citalopram (CELEXA) 10 MG tablet Take 1 tablet (10 mg total) by mouth daily.  Marland Kitchen esomeprazole (NEXIUM) 40 MG capsule Take 1 capsule (40 mg total) by mouth 2 (two) times daily before a meal.  . [DISCONTINUED] predniSONE (DELTASONE) 20 MG tablet Take 2 pills for 3 days, 1 pill for 4 days  .  [DISCONTINUED] promethazine (PHENERGAN) 25 MG tablet Take 1 tablet (25 mg total) by mouth every 6 (six) hours as needed for nausea or vomiting.   No facility-administered encounter medications on file as of 03/24/2018.     Allergies as of 03/24/2018 - Review Complete 03/24/2018  Allergen Reaction Noted  . Penicillins Other (See Comments) 08/01/2008    Past Medical History:  Diagnosis Date  . Abnormal glucose   . Anxiety   . Esophageal stricture    Dr.Dora Olevia Perches  . GERD (gastroesophageal reflux disease)   . History of colonic polyps   . Hyperlipidemia   . MVP (mitral valve prolapse)    history of   . Osteoporosis   . Tobacco abuse    tried Chantix- caused agression    Past Surgical History:  Procedure Laterality Date  . APPENDECTOMY  2008  . BREAST BIOPSY  2000  . COLONOSCOPY    . ESOPHAGOGASTRODUODENOSCOPY    . vocal cord polyps removed     benigh, Dr Erik Obey    Family History  Problem Relation Age of Onset  . Hyperlipidemia Unknown   . Hiatal hernia Unknown        several family members  . Hypertension Mother   . Cancer Mother        apendix. right hemicolectomy  . Other Mother        MGUS  . Alcohol abuse Father   . Cirrhosis Father        deceased form cirrhosis  . Hypertension Father   .  Other Unknown        Fh of abnormal glucose  . Hypertension Sister   . Hypertension Brother   . Colon cancer Neg Hx   . Esophageal cancer Neg Hx   . Rectal cancer Neg Hx     Social History   Socioeconomic History  . Marital status: Married    Spouse name: Not on file  . Number of children: 2  . Years of education: Not on file  . Highest education level: Not on file  Occupational History  . Occupation: Chartered certified accountant   Social Needs  . Financial resource strain: Not on file  . Food insecurity:    Worry: Not on file    Inability: Not on file  . Transportation needs:    Medical: Not on file    Non-medical: Not on file  Tobacco Use  . Smoking  status: Current Every Day Smoker    Packs/day: 1.50    Years: 36.00    Pack years: 54.00    Types: Cigarettes    Start date: 07/15/1978  . Smokeless tobacco: Never Used  Substance and Sexual Activity  . Alcohol use: No    Alcohol/week: 0.0 standard drinks  . Drug use: No  . Sexual activity: Not Currently  Lifestyle  . Physical activity:    Days per week: Not on file    Minutes per session: Not on file  . Stress: Not on file  Relationships  . Social connections:    Talks on phone: Not on file    Gets together: Not on file    Attends religious service: Not on file    Active member of club or organization: Not on file    Attends meetings of clubs or organizations: Not on file    Relationship status: Not on file  . Intimate partner violence:    Fear of current or ex partner: Not on file    Emotionally abused: Not on file    Physically abused: Not on file    Forced sexual activity: Not on file  Other Topics Concern  . Not on file  Social History Narrative   Married over 30 years. Sons 29,35 in 2017. 2 grandkis- 1 grandaughter - Bolivar Haw . 1 grandson. Hudson lewis. All grandkids same son      Cares for mother (drives back and forth to help), husband alcoholic- tremendous stress. Sister stays with mom- stressor      Occupation: office [employer]       Review of systems: Review of Systems  Constitutional: Negative for fever and chills.  HENT: Negative.   Eyes: Negative for blurred vision.  Respiratory: Negative for cough, shortness of breath and wheezing.   Cardiovascular: Negative for chest pain and palpitations.  Gastrointestinal: as per HPI Genitourinary: Negative for dysuria, urgency, frequency and hematuria.  Musculoskeletal: Negative for myalgias, back pain and joint pain.  Skin: Negative for itching and rash.  Neurological: Negative for dizziness, tremors, focal weakness, seizures and loss of consciousness.  Endo/Heme/Allergies: Positive for  seasonal allergies.  Psychiatric/Behavioral: Negative for depression, suicidal ideas and hallucinations.  All other systems reviewed and are negative.   Physical Exam: Vitals:   03/24/18 1413  BP: 112/72  Pulse: 78   Body mass index is 36.61 kg/m. Gen:      No acute distress HEENT:  EOMI, sclera anicteric Neck:     No masses; no thyromegaly Lungs:    Clear to auscultation bilaterally; normal respiratory effort CV:  Regular rate and rhythm; no murmurs Abd:      + bowel sounds; soft, non-tender; no palpable masses, no distension Ext:    No edema; adequate peripheral perfusion Skin:      Warm and dry; no rash Neuro: alert and oriented x 3 Psych: normal mood and affect  Data Reviewed:  Reviewed labs, radiology imaging, old records and pertinent past GI work up   Assessment and Plan/Recommendations:  62 year old female with history of chronic GERD, obesity, chronic smoker here to establish care GERD symptoms are well controlled on Nexium twice daily Discussed potential long-term side effect with PPI use Also discussed smoking cessation Status post EGD with esophageal dilation in 2008 for esophageal stricture.  Patient denies any dysphagia or odynophagia Discussed antireflux measures and lifestyle modifications  Intermittent diarrhea likely secondary to lactose intolerance We will do a trial of lactose-free diet If no improvement, will consider further evaluation  Fecal incontinence: Kegel exercises If no improvement will consider referral to pelvic floor physical therapy  Due for colorectal cancer screening: Family history of colon cancer in mother Last colonoscopy in February 2010  The risks and benefits as well as alternatives of endoscopic procedure(s) have been discussed and reviewed. All questions answered. The patient agrees to proceed.   Damaris Hippo , MD [phone]    CC: [name], MD

## 2018-03-24 NOTE — Patient Instructions (Signed)
We will send Nexium to your pharmacy  You have been scheduled for a colonoscopy. Please follow written instructions given to you at your visit today.  Please pick up your prep supplies at the pharmacy within the next 1-3 days. If you use inhalers (even only as needed), please bring them with you on the day of your procedure. Your physician has requested that you go to www.startemmi.com and enter the access code given to you at your visit today. This web site gives a general overview about your procedure. However, you should still follow specific instructions given to you by our office regarding your preparation for the procedure.    Lactose-Free Diet, Adult If you have lactose intolerance, you are not able to digest lactose. Lactose is a natural sugar found mainly in milk and milk products. You may need to avoid all foods and beverages that contain lactose. A lactose-free diet can help you do this. What do I need to know about this diet?  Do not consume foods, beverages, vitamins, minerals, or medicines with lactose. Read ingredients lists carefully.  Look for the words "lactose-free" on labels.  Use lactase enzyme drops or tablets as directed by your health care provider.  Use lactose-free milk or a milk alternative, such as soy milk, for drinking and cooking.  Make sure you get enough calcium and vitamin D in your diet. A lactose-free eating plan can be lacking in these important nutrients.  Take calcium and vitamin D supplements as directed by your health care provider. Talk to your provider about supplements if you are not able to get enough calcium and vitamin D from food. Which foods have lactose? Lactose is found in:  Milk and foods made from milk.  Yogurt.  Cheese.  Butter.  Margarine.  Sour cream.  Cream.  Whipped toppings and nondairy creamers.  Ice cream and other milk-based desserts.  Lactose is also found in foods or products made with milk or milk  ingredients. To find out whether a food contains milk or a milk ingredient, look at the ingredients list. Avoid foods with the statement "May contain milk" and foods that contain:  Butter.  Cream.  Milk.  Milk solids.  Milk powder.  Whey.  Curd.  Caseinate.  Lactose.  Lactalbumin.  Lactoglobulin.  What are some alternatives to milk and foods made with milk products?  Lactose-free milk.  Soy milk with added calcium and vitamin D.  Almond, coconut, or rice milk with added calcium and vitamin D. Note that these are low in protein.  Soy products, such as soy yogurt, soy cheese, soy ice cream, and soy-based sour cream. Which foods can I eat? Grains Breads and rolls made without milk, such as [place], [place], or [place] bread, bagels, pita, and [employer]. Corn tortillas, corn meal, grits, and polenta. Crackers without lactose or milk solids, such as soda crackers and graham crackers. Cooked or dry cereals without lactose or milk solids. Pasta, quinoa, couscous, barley, oats, bulgur, farro, rice, wild rice, or other grains prepared without milk or lactose. Plain popcorn. Vegetables Fresh, frozen, and canned vegetables without cheese, cream, or butter sauces. Fruits All fresh, canned, frozen, or dried fruits that are not processed with lactose. Meats and Other Protein Sources Plain beef, chicken, fish, [place], lamb, veal, pork, wild game, or ham. Kosher-prepared meat products. Strained or junior meats that do not contain milk. Eggs. Soy meat substitutes. Beans, lentils, and hummus. Tofu. Nuts and seeds. Peanut or other nut butters without lactose. Soups, casseroles, and  mixed dishes without cheese, cream, or milk. Dairy Lactose-free milk. Soy, rice, or almond milk with added calcium and vitamin D. Soy cheese and yogurt. Beverages Carbonated drinks. Tea. Coffee, freeze-dried coffee, and some instant coffees. Fruit and vegetable juices. Condiments Soy sauce. Carob powder. Olives.  Gravy made with water. Baker's cocoa. Angie Fava. Pure seasonings and spices. Ketchup. Mustard. Bouillon. Broth. Sweets and Desserts Water and fruit ices. Gelatin. Cookies, pies, or cakes made from allowed ingredients, such as angel food cake. Pudding made with water or a milk substitute. Lactose-free tofu desserts. Soy, coconut milk, or rice-milk-based frozen desserts. Sugar. Honey. Jam, jelly, and marmalade. Molasses. Pure sugar candy. Dark chocolate without milk. Marshmallows. Fats and Oils Margarines and salad dressings that do not contain milk. Berniece Salines. Vegetable oils. Shortening. Mayonnaise. Soy or coconut-based cream. The items listed above may not be a complete list of recommended foods or beverages. Contact your dietitian for more options. Which foods are not recommended? Grains Breads and rolls that contain milk. Toaster pastries. Muffins, biscuits, waffles, cornbread, and pancakes. These can be prepared at home, commercial, or from mixes. Sweet rolls, donuts, English muffins, fry bread, lefse, flour tortillas with lactose, or [place] toast made with milk or milk ingredients. Crackers that contain lactose. Corn curls. Cooked or dry cereals with lactose. Vegetables Creamed or breaded vegetables. Vegetables in a cheese or butter sauce or with lactose-containing margarines. Instant potatoes. [place] fries. Scalloped or au gratin potatoes. Fruits None. Meats and Other Protein Sources Scrambled eggs, omelets, and souffles that contain milk. Creamed or breaded meat, fish, chicken, or [place]. Sausage products, such as wieners and liver sausage. Cold cuts that contain milk solids. Cheese, cottage cheese, ricotta cheese, and cheese spreads. Lasagna and macaroni and cheese. Pizza. Peanut or other nut butters with added milk solids. Casseroles or mixed dishes containing milk or cheese. Dairy All dairy products, including milk, goat's milk, buttermilk, kefir, acidophilus milk, flavored milk, evaporated milk,  condensed milk, dulce de [place], eggnog, yogurt, cheese, and cheese spreads. Beverages Hot chocolate. Cocoa with lactose. Instant iced teas. Powdered fruit drinks. Smoothies made with milk or yogurt. Condiments Chewing gum that has lactose. Cocoa that has lactose. Spice blends if they contain milk products. Artificial sweeteners that contain lactose. Nondairy creamers. Sweets and Desserts Ice cream, ice milk, gelato, sherbet, and frozen yogurt. Custard, pudding, and mousse. Cake, cream pies, cookies, and other desserts containing milk, cream, cream cheese, or milk chocolate. Pie crust made with milk-containing margarine or butter. Reduced-calorie desserts made with a sugar substitute that contains lactose. Toffee and butterscotch. Milk, white, or dark chocolate that contains milk. Fudge. Caramel. Fats and Oils Margarines and salad dressings that contain milk or cheese. Cream. Half and half. Cream cheese. Sour cream. Chip dips made with sour cream or yogurt. The items listed above may not be a complete list of foods and beverages to avoid. Contact your dietitian for more information. Am I getting enough calcium? Calcium is found in many foods that contain lactose and is important for bone health. The amount of calcium you need depends on your age:  Adults younger than 50 years: 1000 mg of calcium a day.  Adults older than 50 years: 1200 mg of calcium a day.  If you are not getting enough calcium, other calcium sources include:  Orange juice with calcium added. There are 300-350 mg of calcium in 1 cup of orange juice.  Sardines with edible bones. There are 325 mg of calcium in 3 oz of sardines.  Calcium-fortified soy milk.  There are 300-400 mg of calcium in 1 cup of calcium-fortified soy milk.  Calcium-fortified rice or almond milk. There are 300 mg of calcium in 1 cup of calcium-fortified rice or almond milk.  Canned salmon with edible bones. There are 180 mg of calcium in 3 oz of canned  salmon with edible bones.  Calcium-fortified breakfast cereals. There are [phone] mg of calcium in calcium-fortified breakfast cereals.  Tofu set with calcium sulfate. There are 250 mg of calcium in  cup of tofu set with calcium sulfate.  Spinach, cooked. There are 145 mg of calcium in  cup of cooked spinach.  Edamame, cooked. There are 130 mg of calcium in  cup of cooked edamame.  Collard greens, cooked. There are 125 mg of calcium in  cup of cooked collard greens.  Kale, frozen or cooked. There are 90 mg of calcium in  cup of cooked or frozen kale.  Almonds. There are 95 mg of calcium in  cup of almonds.  Broccoli, cooked. There are 60 mg of calcium in 1 cup of cooked broccoli.  This information is not intended to replace advice given to you by your health care provider. Make sure you discuss any questions you have with your health care provider. Document Released: 12/21/2001 Document Revised: 12/07/2015 Document Reviewed: 10/01/2013 Elsevier Interactive Patient Education  2018 [employer].    Kegel Exercises Kegel exercises help strengthen the muscles that support the rectum, vagina, small intestine, bladder, and uterus. Doing Kegel exercises can help:  Improve bladder and bowel control.  Improve sexual response.  Reduce problems and discomfort during pregnancy.  Kegel exercises involve squeezing your pelvic floor muscles, which are the same muscles you squeeze when you try to stop the flow of urine. The exercises can be done while sitting, standing, or lying down, but it is best to vary your position. Phase 1 exercises 1. Squeeze your pelvic floor muscles tight. You should feel a tight lift in your rectal area. If you are a female, you should also feel a tightness in your vaginal area. Keep your stomach, buttocks, and legs relaxed. 2. Hold the muscles tight for up to 10 seconds. 3. Relax your muscles. Repeat this exercise 50 times a day or as many times as told by  your health care provider. Continue to do this exercise for at least 4-6 weeks or for as long as told by your health care provider. This information is not intended to replace advice given to you by your health care provider. Make sure you discuss any questions you have with your health care provider. Document Released: 06/17/2012 Document Revised: 02/24/2016 Document Reviewed: 05/21/2015 Elsevier Interactive Patient Education  2018 [place].   Gastroesophageal Reflux Disease, Adult Normally, food travels down the esophagus and stays in the stomach to be digested. However, when a person has gastroesophageal reflux disease (GERD), food and stomach acid move back up into the esophagus. When this happens, the esophagus becomes sore and inflamed. Over time, GERD can create small holes (ulcers) in the lining of the esophagus. What are the causes? This condition is caused by a problem with the muscle between the esophagus and the stomach (lower esophageal sphincter, or LES). Normally, the LES muscle closes after food passes through the esophagus to the stomach. When the LES is weakened or abnormal, it does not close properly, and that allows food and stomach acid to go back up into the esophagus. The LES can be weakened by certain dietary substances, medicines, and medical  conditions, including:  Tobacco use.  Pregnancy.  Having a hiatal hernia.  Heavy alcohol use.  Certain foods and beverages, such as coffee, chocolate, onions, and peppermint.  What increases the risk? This condition is more likely to develop in:  People who have an increased body weight.  People who have connective tissue disorders.  People who use NSAID medicines.  What are the signs or symptoms? Symptoms of this condition include:  Heartburn.  Difficult or painful swallowing.  The feeling of having a lump in the throat.  Abitter taste in the mouth.  Bad breath.  Having a large amount of  saliva.  Having an upset or bloated stomach.  Belching.  Chest pain.  Shortness of breath or wheezing.  Ongoing (chronic) cough or a night-time cough.  Wearing away of tooth enamel.  Weight loss.  Different conditions can cause chest pain. Make sure to see your health care provider if you experience chest pain. How is this diagnosed? Your health care provider will take a medical history and perform a physical exam. To determine if you have mild or severe GERD, your health care provider may also monitor how you respond to treatment. You may also have other tests, including:  An endoscopy toexamine your stomach and esophagus with a small camera.  A test thatmeasures the acidity level in your esophagus.  A test thatmeasures how much pressure is on your esophagus.  A barium swallow or modified barium swallow to show the shape, size, and functioning of your esophagus.  How is this treated? The goal of treatment is to help relieve your symptoms and to prevent complications. Treatment for this condition may vary depending on how severe your symptoms are. Your health care provider may recommend:  Changes to your diet.  Medicine.  Surgery.  Follow these instructions at home: Diet  Follow a diet as recommended by your health care provider. This may involve avoiding foods and drinks such as: ? Coffee and tea (with or without caffeine). ? Drinks that containalcohol. ? Energy drinks and sports drinks. ? Carbonated drinks or sodas. ? Chocolate and cocoa. ? Peppermint and mint flavorings. ? Garlic and onions. ? Horseradish. ? Spicy and acidic foods, including peppers, chili powder, curry powder, vinegar, hot sauces, and barbecue sauce. ? Citrus fruit juices and citrus fruits, such as oranges, lemons, and limes. ? Tomato-based foods, such as red sauce, chili, salsa, and pizza with red sauce. ? Fried and fatty foods, such as donuts, french fries, potato chips, and high-fat  dressings. ? High-fat meats, such as hot dogs and fatty cuts of red and white meats, such as rib eye steak, sausage, ham, and bacon. ? High-fat dairy items, such as whole milk, butter, and cream cheese.  Eat small, frequent meals instead of large meals.  Avoid drinking large amounts of liquid with your meals.  Avoid eating meals during the 2-3 hours before bedtime.  Avoid lying down right after you eat.  Do not exercise right after you eat. General instructions  Pay attention to any changes in your symptoms.  Take over-the-counter and prescription medicines only as told by your health care provider. Do not take aspirin, ibuprofen, or other NSAIDs unless your health care provider told you to do so.  Do not use any tobacco products, including cigarettes, chewing tobacco, and e-cigarettes. If you need help quitting, ask your health care provider.  Wear loose-fitting clothing. Do not wear anything tight around your waist that causes pressure on your abdomen.  Raise (elevate)  the head of your bed 6 inches (15cm).  Try to reduce your stress, such as with yoga or meditation. If you need help reducing stress, ask your health care provider.  If you are overweight, reduce your weight to an amount that is healthy for you. Ask your health care provider for guidance about a safe weight loss goal.  Keep all follow-up visits as told by your health care provider. This is important. Contact a health care provider if:  You have new symptoms.  You have unexplained weight loss.  You have difficulty swallowing, or it hurts to swallow.  You have wheezing or a persistent cough.  Your symptoms do not improve with treatment.  You have a hoarse voice. Get help right away if:  You have pain in your arms, neck, jaw, teeth, or back.  You feel sweaty, dizzy, or light-headed.  You have chest pain or shortness of breath.  You vomit and your vomit looks like blood or coffee grounds.  You  faint.  Your stool is bloody or black.  You cannot swallow, drink, or eat. This information is not intended to replace advice given to you by your health care provider. Make sure you discuss any questions you have with your health care provider. Document Released: 04/10/2005 Document Revised: 11/29/2015 Document Reviewed: 10/26/2014 Elsevier Interactive Patient Education  [employer].

## 2018-04-02 ENCOUNTER — Other Ambulatory Visit: Payer: Self-pay | Admitting: Family Medicine

## 2018-04-02 DIAGNOSIS — F4323 Adjustment disorder with mixed anxiety and depressed mood: Secondary | ICD-10-CM

## 2018-04-09 ENCOUNTER — Ambulatory Visit (AMBULATORY_SURGERY_CENTER): Payer: BLUE CROSS/BLUE SHIELD | Admitting: Gastroenterology

## 2018-04-09 ENCOUNTER — Encounter: Payer: Self-pay | Admitting: Gastroenterology

## 2018-04-09 ENCOUNTER — Other Ambulatory Visit: Payer: Self-pay

## 2018-04-09 VITALS — BP 121/76 | HR 59 | Temp 97.8°F | Resp 13 | Ht 65.0 in | Wt 220.0 lb

## 2018-04-09 DIAGNOSIS — K621 Rectal polyp: Secondary | ICD-10-CM

## 2018-04-09 DIAGNOSIS — D123 Benign neoplasm of transverse colon: Secondary | ICD-10-CM | POA: Diagnosis not present

## 2018-04-09 DIAGNOSIS — Z800 Family history of malignant neoplasm of digestive organs: Secondary | ICD-10-CM | POA: Diagnosis not present

## 2018-04-09 DIAGNOSIS — D120 Benign neoplasm of cecum: Secondary | ICD-10-CM | POA: Diagnosis not present

## 2018-04-09 DIAGNOSIS — Z1211 Encounter for screening for malignant neoplasm of colon: Secondary | ICD-10-CM | POA: Diagnosis not present

## 2018-04-09 DIAGNOSIS — Z86010 Personal history of colonic polyps: Secondary | ICD-10-CM | POA: Diagnosis not present

## 2018-04-09 DIAGNOSIS — D128 Benign neoplasm of rectum: Secondary | ICD-10-CM

## 2018-04-09 MED ORDER — SODIUM CHLORIDE 0.9 % IV SOLN: 500.0000 mL | Freq: Once | INTRAVENOUS | Status: DC

## 2018-04-09 NOTE — Progress Notes (Signed)
Called to room to assist during endoscopic procedure.  Patient ID and intended procedure confirmed with present staff. Received instructions for my participation in the procedure from the performing physician.  

## 2018-04-09 NOTE — Patient Instructions (Addendum)
Thank you for allowing [place] to care for you today!  Resume previous diet and medications.  Avoid NSAIDS  (Ibuprofen, Naprosyn) for the next two weeks.  Return to normal activities tomorrow  Await pathology results by mail, 1-2 weeks.  Expect next colonoscopy in 3 years, but await pathology for final decision.      YOU HAD AN ENDOSCOPIC PROCEDURE TODAY AT [facility]:   Refer to the procedure report that was given to you for any specific questions about what was found during the examination.  If the procedure report does not answer your questions, please call your gastroenterologist to clarify.  If you requested that your care partner not be given the details of your procedure findings, then the procedure report has been included in a sealed envelope for you to review at your convenience later.  YOU SHOULD EXPECT: Some feelings of bloating in the abdomen. Passage of more gas than usual.  Walking can help get rid of the air that was put into your GI tract during the procedure and reduce the bloating. If you had a lower endoscopy (such as a colonoscopy or flexible sigmoidoscopy) you may notice spotting of blood in your stool or on the toilet paper. If you underwent a bowel prep for your procedure, you may not have a normal bowel movement for a few days.  Please Note:  You might notice some irritation and congestion in your nose or some drainage.  This is from the oxygen used during your procedure.  There is no need for concern and it should clear up in a day or so.  SYMPTOMS TO REPORT IMMEDIATELY:   Following lower endoscopy (colonoscopy or flexible sigmoidoscopy):  Excessive amounts of blood in the stool  Significant tenderness or worsening of abdominal pains  Swelling of the abdomen that is new, acute  Fever of 100F or higher    For urgent or emergent issues, a gastroenterologist can be reached at any hour by calling [phone].   DIET:  We do recommend a small  meal at first, but then you may proceed to your regular diet.  Drink plenty of fluids but you should avoid alcoholic beverages for 24 hours.  ACTIVITY:  You should plan to take it easy for the rest of today and you should NOT DRIVE or use heavy machinery until tomorrow (because of the sedation medicines used during the test).    FOLLOW UP: Our staff will call the number listed on your records the next business day following your procedure to check on you and address any questions or concerns that you may have regarding the information given to you following your procedure. If we do not reach you, we will leave a message.  However, if you are feeling well and you are not experiencing any problems, there is no need to return our call.  We will assume that you have returned to your regular daily activities without incident.  If any biopsies were taken you will be contacted by phone or by letter within the next 1-3 weeks.  Please call us at [phone] if you have not heard about the biopsies in 3 weeks.    SIGNATURES/CONFIDENTIALITY: You and/or your care partner have signed paperwork which will be entered into your electronic medical record.  These signatures attest to the fact that that the information above on your After Visit Summary has been reviewed and is understood.  Full responsibility of the confidentiality of this discharge information lies with  you and/or your care-partner.

## 2018-04-09 NOTE — Progress Notes (Signed)
Pt's states no medical or surgical changes since previsit or office visit. 

## 2018-04-09 NOTE — Op Note (Signed)
[name] Patient Name: [name] [name] Procedure Date: 04/09/2018 7:58 AM MRN: [id number] Endoscopist: Mauri Pole , MD Age: 62 Referring MD:  Date of Birth: [birth year] Gender: Female Account #: [id number] Procedure:                Colonoscopy Indications:              Screening patient at increased risk: Family history                            of 1st-degree relative with colorectal cancer at                            age 30 years (or older) Medicines:                Monitored Anesthesia Care Procedure:                Pre-Anesthesia Assessment:                           - Prior to the procedure, a History and Physical                            was performed, and patient medications and                            allergies were reviewed. [name] tolerance of                            previous anesthesia was also reviewed. The risks                            and benefits of the procedure and the sedation                            options and risks were discussed with the patient.                            All questions were answered, and informed consent                            was obtained. Prior Anticoagulants: The patient has                            taken no previous anticoagulant or antiplatelet                            agents. ASA Grade Assessment: II - A patient with                            mild systemic disease. After reviewing the risks                            and benefits, the patient was deemed in  satisfactory condition to undergo the procedure.                           After obtaining informed consent, the colonoscope                            was passed under direct vision. Throughout the                            procedure, the patient's blood pressure, pulse, and                            oxygen saturations were monitored continuously. The                            Model PCF-[id number])  scope was introduced                            through the anus and advanced to the the cecum,                            identified by appendiceal orifice and ileocecal                            valve. The patient tolerated the procedure well.                            The quality of the bowel preparation was excellent.                            The ileocecal valve, appendiceal orifice, and                            rectum were photographed. Scope In: 8:09:57 AM Scope Out: 8:27:06 AM Scope Withdrawal Time: 0 hours 14 minutes 13 seconds  Total Procedure Duration: 0 hours 17 minutes 9 seconds  Findings:                 The perianal and digital rectal examinations were                            normal.                           Three sessile polyps were found in the rectum,                            transverse colon and cecum. The polyps were 5 to 14                            mm in size. These polyps were removed with a cold                            snare. Resection and retrieval were complete.  A 2 mm polyp was found in the transverse colon. The                            polyp was sessile. The polyp was removed with a                            cold biopsy forceps. Resection and retrieval were                            complete. Complications:            No immediate complications. Estimated Blood Loss:     Estimated blood loss was minimal. Impression:               - Three 5 to 14 mm polyps in the rectum, in the                            transverse colon and in the cecum, removed with a                            cold snare. Resected and retrieved.                           - One 2 mm polyp in the transverse colon, removed                            with a cold biopsy forceps. Resected and retrieved. Recommendation:           - Patient has a contact number available for                            emergencies. The signs and symptoms of potential                             delayed complications were discussed with the                            patient. Return to normal activities tomorrow.                            Written discharge instructions were provided to the                            patient.                           - Resume previous diet.                           - Continue present medications.                           - Await pathology results.                           -  Repeat colonoscopy in 3 years for surveillance                            based on pathology results. Mauri Pole, MD 04/09/2018 8:37:22 AM This report has been signed electronically.

## 2018-04-09 NOTE — Progress Notes (Signed)
Report given to PACU, vss 

## 2018-04-10 ENCOUNTER — Telehealth: Payer: Self-pay | Admitting: *Deleted

## 2018-04-10 NOTE — Telephone Encounter (Signed)
  Follow up Call-  Call back number 04/09/2018  Post procedure Call Back phone  # [phone]  Permission to leave phone message Yes  Some recent data might be hidden     Patient questions:  Do you have a fever, pain , or abdominal swelling? No. Pain Score  0 *  Have you tolerated food without any problems? Yes.    Have you been able to return to your normal activities? Yes.    Do you have any questions about your discharge instructions: Diet   No. Medications  No. Follow up visit  No.  Do you have questions or concerns about your Care? No.  Actions: * If pain score is 4 or above: No action needed, pain <4.

## 2018-04-15 ENCOUNTER — Encounter: Payer: Self-pay | Admitting: Gastroenterology

## 2018-06-11 ENCOUNTER — Other Ambulatory Visit: Payer: Self-pay | Admitting: Family Medicine

## 2018-10-05 ENCOUNTER — Other Ambulatory Visit: Payer: Self-pay | Admitting: Family Medicine

## 2018-10-05 DIAGNOSIS — F4323 Adjustment disorder with mixed anxiety and depressed mood: Secondary | ICD-10-CM

## 2018-10-05 NOTE — Telephone Encounter (Signed)
Okay for refill? Please advise 

## 2018-10-05 NOTE — Telephone Encounter (Signed)
Entered last message in error.

## 2018-10-05 NOTE — Telephone Encounter (Signed)
Reminder note left for me to remind pt to schedule a f/u when COVID-19 calms

## 2018-10-13 ENCOUNTER — Ambulatory Visit (INDEPENDENT_AMBULATORY_CARE_PROVIDER_SITE_OTHER): Payer: BLUE CROSS/BLUE SHIELD | Admitting: Family Medicine

## 2018-10-13 ENCOUNTER — Ambulatory Visit: Payer: Self-pay | Admitting: Family Medicine

## 2018-10-13 ENCOUNTER — Encounter: Payer: Self-pay | Admitting: Family Medicine

## 2018-10-13 VITALS — Temp 97.9°F | Ht 65.0 in | Wt 218.0 lb

## 2018-10-13 DIAGNOSIS — I10 Essential (primary) hypertension: Secondary | ICD-10-CM | POA: Diagnosis not present

## 2018-10-13 DIAGNOSIS — B9689 Other specified bacterial agents as the cause of diseases classified elsewhere: Secondary | ICD-10-CM | POA: Diagnosis not present

## 2018-10-13 DIAGNOSIS — J329 Chronic sinusitis, unspecified: Secondary | ICD-10-CM | POA: Diagnosis not present

## 2018-10-13 MED ORDER — DOXYCYCLINE HYCLATE 100 MG PO TABS: 100.0000 mg | ORAL_TABLET | Freq: Two times a day (BID) | ORAL | 0 refills | Status: AC

## 2018-10-13 NOTE — Telephone Encounter (Signed)
Schedule webex

## 2018-10-13 NOTE — Telephone Encounter (Signed)
Pt. Reports she noticed last Friday an area of swelling at the base of her skull. Noticed two "knots" which she thought could be lymph nodes. States her husband looked at the area, "but didn't really see anything." States she can feel the swelling when she leans her head back. States she currently has some sinus symptoms and has a tick bite after she noticed this swelling. No fever.Spoke with Watt Climes in practice and will send triage note for evaluation.Please advise pt. Answer Assessment - Initial Assessment Questions 1. APPEARANCE of SWELLING: "What does it look like?"     2 inch area 2. SIZE: "How large is the swelling?" (inches, cm or compare to coins)     2 inches  Knot on both sides 3. LOCATION: "Where is the swelling located?"     At the base of her skull 4. ONSET: "When did the swelling start?"     Started Friday 5. PAIN: "Is it painful?" If so, ask: "How much?"     Sensitive 6. ITCH: "Does it itch?" If so, ask: "How much?"     Yes - Mild 7. CAUSE: "What do you think caused the swelling?"     Unsure 8. NODE: "Does it feel like a lymph node?" (Note: nodes have a boundary or edge and are movable, unlike most insect bites)     Maybe  Protocols used: SKIN - LUMP OR LOCALIZED SWELLING-P-AH

## 2018-10-13 NOTE — Patient Instructions (Addendum)
Health Maintenance Due  Topic Date Due  . MAMMOGRAM have not had this done- she plans to do this 04/24/2013  . PAP SMEAR-going to schedule with gynecology-  had not had this done as well 09/21/2017  . INFLUENZA VACCINE - declined flu shot 02/12/2018   Start doxycycline for 7 days . If not improved particularly in regards to area in back of neck let us know within a month if not better- may refer to ENT

## 2018-10-13 NOTE — Progress Notes (Signed)
Phone [phone]   Subjective:  Virtual visit via Video note  Our team/I connected with [name] [name] [facility] on 10/13/18 at  3:00 PM EDT by a video enabled telemedicine application (webex) and verified that I am speaking with the correct person using two identifiers.  Location patient: [place] Location provider: [facility], office Persons participating in the virtual visit: patient  Our team/I discussed the limitations of evaluation and management by telemedicine and the availability of in person appointments. In light of current covid-19 pandemic, patient also understands that we are trying to protect them by minimizing in office contact if at all possible.  The patient expressed consent for telemedicine visit and agreed to proceed.   ROS- No chest pain or shortness of breath. No blurry vision. No fever.    Past Medical History-  Patient Active Problem List   Diagnosis Date Noted  . TOBACCO ABUSE 09/30/2008    Priority: High  . Hypertension 10/20/2017    Priority: Medium  . History of squamous cell carcinoma of skin 09/12/2016    Priority: Medium  . 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 Adjustment disorder with mixed anxiety and depressed mood 08/17/2012    Priority: Medium  . Hot flashes 03/23/2012    Priority: Medium  . GERD 08/28/2009    Priority: Medium  . Abnormal glucose 08/28/2009    Priority: Medium  . PARESTHESIA 11/11/2008    Priority: Medium  . Hyperlipemia 09/30/2008    Priority: Medium  . Osteopenia 09/30/2008    Priority: Medium  . OSA (obstructive sleep apnea) 01/18/2013    Priority: Low  . History of colonic polyps 11/11/2008    Priority: Low  . Benign paroxysmal positional vertigo 10/25/2008    Priority: Low    Medications- reviewed and updated Current Outpatient Medications  Medication Sig Dispense Refill  . amLODipine (NORVASC) 2.5 MG tablet TAKE 1 TABLET BY MOUTH EVERY DAY 90 tablet 1  . atorvastatin (LIPITOR) 40 MG  tablet Take 1 tablet (40 mg total) by mouth daily. 90 tablet 3  . citalopram (CELEXA) 20 MG tablet TAKE 1 TABLET BY MOUTH EVERY DAY 90 tablet 0  . esomeprazole (NEXIUM) 40 MG capsule Take 1 capsule (40 mg total) by mouth 2 (two) times daily before a meal. 60 capsule 1  . pseudoephedrine (SUDAFED) 30 MG tablet Take 30 mg by mouth every 8 (eight) hours as needed for congestion.     No current facility-administered medications for this visit.      Objective:  Temp 97.9 F (36.6 C) (Oral)   Ht 5\' 5"  (1.651 m)   Wt 218 lb (98.9 kg)   BMI 36.28 kg/m  Gen: NAD, resting comfortably, occasional cough Points to pain over frontal sinuses and over top of posterior neck- im unable to visualize abnormality by camera/video Lungs: nonlabored, normal respiratory rate  Skin: warm, dry, no obvious rash    Assessment and Plan   # Bacterial sinusitis S: has been dealing with sinus congestion for 2 weeks (worse over 4 days- with swelling in neck). Has been dealing with postnasal drip, ear pressure and head/sinus fullness particularly in the afternoon. Ears feel full. Has tried delsym and sudafed (advised against it due to hypertension). Blowing out somewhat clear discharge- but very thick. No fever. Some cough from congestion.   Felt like she had some swelling in her neck- sounds like possible lymph nodes. Feels like swelling has gone down. Was some itching along the hairline as  well.  A/P: bacterial sinusitis with likely related enlarged lymph node in the neck.  - treat with doxycycline given 2 weeks of sinus symptoms - if lymph node doesn't go down within a month- refer to ent - went over self quarantine guidelines- 7 days past onset of symptoms AND 3 days post cough  #hypertension S: controlled on amlodipine 2.5mg  in the past BP Readings from Last 3 Encounters:  04/09/18 121/76  03/24/18 112/72  12/18/17 126/84  A/P: though BP has been controlled-recommended against using Sudafed as this may raise  blood pressure-hopefully doxycycline will clear sinus symptoms  No future appointments. No follow-ups on file.  Lab/Order associations: Bacterial sinusitis  Essential hypertension  Meds ordered this encounter  Medications  . doxycycline (VIBRA-TABS) 100 MG tablet    Sig: Take 1 tablet (100 mg total) by mouth 2 (two) times daily for 7 days.    Dispense:  14 tablet    Refill:  0   Return precautions advised.  [name], MD

## 2018-12-28 ENCOUNTER — Other Ambulatory Visit: Payer: Self-pay | Admitting: Family Medicine

## 2019-01-02 ENCOUNTER — Other Ambulatory Visit: Payer: Self-pay | Admitting: Family Medicine

## 2019-01-02 DIAGNOSIS — F4323 Adjustment disorder with mixed anxiety and depressed mood: Secondary | ICD-10-CM

## 2019-02-22 ENCOUNTER — Telehealth: Payer: Self-pay | Admitting: Gastroenterology

## 2019-02-22 NOTE — Telephone Encounter (Signed)
Working on Prior auth through [employer] My meds

## 2019-02-23 NOTE — Telephone Encounter (Signed)
Had to contact [facility] to get current insurance information   [employer]  BIN 4000 ID [id number]   Cover my meds approved Nexium

## 2019-03-26 ENCOUNTER — Other Ambulatory Visit: Payer: Self-pay | Admitting: Family Medicine

## 2019-04-08 ENCOUNTER — Other Ambulatory Visit: Payer: Self-pay | Admitting: Family Medicine

## 2019-04-08 DIAGNOSIS — F4323 Adjustment disorder with mixed anxiety and depressed mood: Secondary | ICD-10-CM

## 2019-04-08 NOTE — Telephone Encounter (Signed)
Due for follow-up visit. Please call to schedule  Thanks!

## 2019-04-11 DIAGNOSIS — Z23 Encounter for immunization: Secondary | ICD-10-CM | POA: Diagnosis not present

## 2019-05-03 ENCOUNTER — Telehealth: Payer: Self-pay | Admitting: Family Medicine

## 2019-05-03 ENCOUNTER — Ambulatory Visit (INDEPENDENT_AMBULATORY_CARE_PROVIDER_SITE_OTHER): Payer: BC Managed Care – PPO | Admitting: Family Medicine

## 2019-05-03 ENCOUNTER — Encounter: Payer: Self-pay | Admitting: Family Medicine

## 2019-05-03 DIAGNOSIS — R05 Cough: Secondary | ICD-10-CM

## 2019-05-03 DIAGNOSIS — R059 Cough, unspecified: Secondary | ICD-10-CM

## 2019-05-03 MED ORDER — GUAIFENESIN-CODEINE 100-10 MG/5ML PO SOLN: 5.0000 mL | Freq: Three times a day (TID) | ORAL | 0 refills | Status: DC | PRN

## 2019-05-03 MED ORDER — AZELASTINE HCL 0.1 % NA SOLN: 2.0000 | Freq: Two times a day (BID) | NASAL | 12 refills | Status: DC

## 2019-05-03 NOTE — Telephone Encounter (Signed)
° °  Pt returned call and has a covid test with [facility]  at 11;30

## 2019-05-03 NOTE — Progress Notes (Signed)
   Chief Complaint:  NEILA HOLTS is a 63 y.o. female who presents today for a virtual office visit with a chief complaint of cough.   Assessment/Plan:  Cough Symptoms consistent with COVID-19 infection.  Test is pending.  Will treat symptomatically with Astelin nasal spray for rhinorrhea/sinus congestion and guaifenesin-codeine for her cough.  Encouraged good oral hydration and relative rest.  Recommend over-the-counter analgesics as needed.  Discussed CDC protocol for self-isolation and quarantine.  Discussed reasons to return to care.  Follow-up as needed.    Subjective:  HPI:  Cough Started 2 days ago. Associated with diarrhea, bodyaches, fever, nasal congestion.  Also with headaches. No fevers today. Cough seems to be more severe at night. No treatments tried. No sick contacts. No shortness of breath or chest pain. No other obvious aggravating or alleviating factors.   She got [facility] test [facility] this morning.  ROS: Per HPI  PMH: She reports that she has been smoking cigarettes. She started smoking about 40 years ago. She has a 54.00 pack-year smoking history. She has never used smokeless tobacco. She reports that she does not drink alcohol or use drugs.      Objective/Observations  Physical Exam: Gen: NAD, resting comfortably Pulm: Normal work of breathing Neuro: Grossly normal, moves all extremities Psych: Normal affect and thought content  No results found for this or any previous visit (from the past 24 hour(s)).   Virtual Visit via Video   I connected with [facility] on 05/03/19 at  3:40 PM EDT by a video enabled telemedicine application and verified that I am speaking with the correct person using two identifiers. I discussed the limitations of evaluation and management by telemedicine and the availability of in person appointments. The patient expressed understanding and agreed to proceed.   Patient location: Home Provider location: [place] participating in the virtual visit: Myself and Patient     Algis Greenhouse. Jerline Pain, MD 05/03/2019 3:55 PM

## 2019-05-03 NOTE — Telephone Encounter (Signed)
Called pt to schedule a virtual visit for today, due to teamhealth note that pt was experiencing diarrhea, congestion, and low grade fever, mild sore throat, pt also experiencing some nausea but no vomiting. No answer, LVM for pt to call back and we will schedule virtual appt.

## 2019-05-18 ENCOUNTER — Ambulatory Visit: Payer: BLUE CROSS/BLUE SHIELD | Admitting: Family Medicine

## 2019-05-25 ENCOUNTER — Other Ambulatory Visit: Payer: Self-pay | Admitting: Gastroenterology

## 2019-07-16 HISTORY — PX: BREAST BIOPSY: SHX20

## 2019-10-28 ENCOUNTER — Encounter: Payer: Self-pay | Admitting: Family Medicine

## 2019-10-28 ENCOUNTER — Telehealth (INDEPENDENT_AMBULATORY_CARE_PROVIDER_SITE_OTHER): Payer: BC Managed Care – PPO | Admitting: Family Medicine

## 2019-10-28 VITALS — Wt 220.0 lb

## 2019-10-28 DIAGNOSIS — R0981 Nasal congestion: Secondary | ICD-10-CM | POA: Diagnosis not present

## 2019-10-28 DIAGNOSIS — R062 Wheezing: Secondary | ICD-10-CM | POA: Diagnosis not present

## 2019-10-28 DIAGNOSIS — R05 Cough: Secondary | ICD-10-CM | POA: Diagnosis not present

## 2019-10-28 DIAGNOSIS — R519 Headache, unspecified: Secondary | ICD-10-CM | POA: Diagnosis not present

## 2019-10-28 DIAGNOSIS — F17200 Nicotine dependence, unspecified, uncomplicated: Secondary | ICD-10-CM

## 2019-10-28 DIAGNOSIS — R059 Cough, unspecified: Secondary | ICD-10-CM

## 2019-10-28 MED ORDER — ALBUTEROL SULFATE HFA 108 (90 BASE) MCG/ACT IN AERS: 2.0000 | INHALATION_SPRAY | Freq: Four times a day (QID) | RESPIRATORY_TRACT | 0 refills | Status: DC | PRN

## 2019-10-28 MED ORDER — DOXYCYCLINE HYCLATE 100 MG PO TABS: 100.0000 mg | ORAL_TABLET | Freq: Two times a day (BID) | ORAL | 0 refills | Status: DC

## 2019-10-28 MED ORDER — PREDNISONE 20 MG PO TABS: 40.0000 mg | ORAL_TABLET | Freq: Every day | ORAL | 0 refills | Status: DC

## 2019-10-28 NOTE — Progress Notes (Signed)
Virtual Visit via Video Note  I connected with [name] [name]  on 10/28/19 at  3:00 PM EDT by a video enabled telemedicine application and verified that I am speaking with the correct person using two identifiers.  Location patient: home, [place] Location provider:work or home office Persons participating in the virtual visit: patient, provider  I discussed the limitations of evaluation and management by telemedicine and the availability of in person appointments. The patient expressed understanding and agreed to proceed.   HPI:  Acute virtual visit for sinus issues: -started: about 1 week ago -symptoms include:nasal congestion, PND, cough, a little wheezing at times,  ear pressure, sinus pain, body aches - cough and nasal congestion are the worst symptoms and progressing -the last few days and now with sinus pain, did have some diarrhea for one day but she thinks is from something she ate -husband has had nasal congestion -husband does work outside the home in a manufacturing facility - they are instructed to wear masks and distance at his work, but she is not sure that they do not always follow the precautions -patient has not been outside the home much - she reports she is very careful in terms of covid exposure - however did eat out at a restaurant with her sons the day after this started -denies:fevers, SOB, loss of taste and smell -she is a smoker -sudafed helps some - but she has been careful about taking, reports fear of medications -history of: she has a long history of smoking - denies history of lung disease -reports she does usually get bad allergies this time of the year and usually gets a sinus infection this time of the year -she did use albuterol in the past and it seemed to really help  -she also used prednisone in the past when sick  ROS: See pertinent positives and negatives per HPI.  Past Medical History:  Diagnosis Date  . Abnormal glucose   . Anxiety   . Esophageal stricture     Dr.Dora Olevia Perches  . GERD (gastroesophageal reflux disease)   . History of colonic polyps   . Hyperlipidemia   . MVP (mitral valve prolapse)    history of   . Osteoporosis   . Tobacco abuse    tried Chantix- caused agression    Past Surgical History:  Procedure Laterality Date  . APPENDECTOMY  2008  . BREAST BIOPSY  2000  . COLONOSCOPY    . ESOPHAGOGASTRODUODENOSCOPY    . vocal cord polyps removed     benigh, Dr Erik Obey    Family History  Problem Relation Age of Onset  . Hyperlipidemia Unknown   . Hiatal hernia Unknown        several family members  . Hypertension Mother   . Cancer Mother        apendix. right hemicolectomy  . Other Mother        MGUS  . Alcohol abuse Father   . Cirrhosis Father        deceased form cirrhosis  . Hypertension Father   . Other Unknown        Fh of abnormal glucose  . Hypertension Sister   . Hypertension Brother   . Colon cancer Neg Hx   . Esophageal cancer Neg Hx   . Rectal cancer Neg Hx     SOCIAL HX: see hpi   Current Outpatient Medications:  .  amLODipine (NORVASC) 2.5 MG tablet, TAKE 1 TABLET BY MOUTH EVERY DAY, Disp: 90 tablet, Rfl: 0 .  atorvastatin (LIPITOR) 40 MG tablet, TAKE 1 TABLET BY MOUTH EVERY DAY, Disp: 90 tablet, Rfl: 0 .  azelastine (ASTELIN) 0.1 % nasal spray, Place 2 sprays into both nostrils 2 (two) times daily., Disp: 30 mL, Rfl: 12 .  citalopram (CELEXA) 20 MG tablet, TAKE 1 TABLET BY MOUTH EVERY DAY, Disp: 90 tablet, Rfl: 0 .  NEXIUM 40 MG capsule, TAKE 1 CAPSULE BY MOUTH TWICE A DAY BEFORE A MEAL, Disp: 180 capsule, Rfl: 3 .  albuterol (VENTOLIN HFA) 108 (90 Base) MCG/ACT inhaler, Inhale 2 puffs into the lungs every 6 (six) hours as needed., Disp: 8.5 g, Rfl: 0 .  doxycycline (VIBRA-TABS) 100 MG tablet, Take 1 tablet (100 mg total) by mouth 2 (two) times daily., Disp: 14 tablet, Rfl: 0 .  predniSONE (DELTASONE) 20 MG tablet, Take 2 tablets (40 mg total) by mouth daily with breakfast., Disp: 8 tablet, Rfl:  0  EXAM:  VITALS per patient if applicable:  GENERAL: alert, oriented, appears well and in no acute distress  HEENT: atraumatic, conjunttiva clear, no obvious abnormalities on inspection of external nose and ears  NECK: normal movements of the head and neck  LUNGS: on inspection no signs of respiratory distress, breathing rate appears normal, no obvious gross SOB, gasping or wheezing, coughing a lot during the visit - somewhat wheezy sounding cough  CV: no obvious cyanosis  MS: moves all visible extremities without noticeable abnormality  PSYCH/NEURO: pleasant and cooperative, no obvious depression or anxiety, speech and thought processing grossly intact  ASSESSMENT AND PLAN:  Discussed the following assessment and plan:  Sinus congestion  Facial pain  Wheezing  Cough  Smoker  -we discussed possible serious and likely etiologies, options for evaluation and workup, limitations of telemedicine visit vs in person visit, treatment, treatment risks and precautions. Pt prefers to treat via telemedicine empirically rather then risking or undertaking an in person visit at this moment. Opted for treatment with doxycycline to cover for sinsuitis and LRI, prednisone and alb inh as may have undiagnosed COPD or asthma given wheezing/bad cough and reported hx requirement for alb and prednisone with illnesses in the past. She also is going to get COVID19 testing - discussed options and limitations. Discussed outpt treatment options for COVID19 if positive. Agrees to self isolation except to seek care per cdc guidelines. Patient agrees to seek prompt in person care if worsening, new symptoms arise, or if is not improving with treatment. Smoking, covid precuations, covid vaccine counseling provided.   I discussed the assessment and treatment plan with the patient. The patient was provided an opportunity to ask questions and all were answered. The patient agreed with the plan and demonstrated an  understanding of the instructions.   The patient was advised to call back or seek an in-person evaluation if the symptoms worsen or if the condition fails to improve as anticipated.   Lucretia Kern, DO

## 2019-10-29 DIAGNOSIS — Z20828 Contact with and (suspected) exposure to other viral communicable diseases: Secondary | ICD-10-CM | POA: Diagnosis not present

## 2019-10-29 DIAGNOSIS — Z03818 Encounter for observation for suspected exposure to other biological agents ruled out: Secondary | ICD-10-CM | POA: Diagnosis not present

## 2019-11-20 ENCOUNTER — Other Ambulatory Visit: Payer: Self-pay | Admitting: Family Medicine

## 2020-01-27 ENCOUNTER — Telehealth: Payer: Self-pay | Admitting: *Deleted

## 2020-01-27 NOTE — Telephone Encounter (Signed)
Sent Nexium prior auth through Cover My Meds today

## 2020-02-02 MED ORDER — ESOMEPRAZOLE MAGNESIUM 40 MG PO CPDR: DELAYED_RELEASE_CAPSULE | ORAL | 3 refills | Status: DC

## 2020-02-02 NOTE — Telephone Encounter (Signed)
Nexium approved through Cover My Med[employer]

## 2020-02-02 NOTE — Telephone Encounter (Signed)
New rx of Nexium sent to patients pharmacy   [facility] [address]

## 2020-02-02 NOTE — Addendum Note (Signed)
Addended by: Oda Kilts on: 02/02/2020 04:17 PM   Modules accepted: Orders

## 2020-02-17 DIAGNOSIS — Z01419 Encounter for gynecological examination (general) (routine) without abnormal findings: Secondary | ICD-10-CM | POA: Diagnosis not present

## 2020-02-17 DIAGNOSIS — Z13820 Encounter for screening for osteoporosis: Secondary | ICD-10-CM | POA: Diagnosis not present

## 2020-02-17 DIAGNOSIS — Z1231 Encounter for screening mammogram for malignant neoplasm of breast: Secondary | ICD-10-CM | POA: Diagnosis not present

## 2020-02-17 DIAGNOSIS — Z6836 Body mass index (BMI) 36.0-36.9, adult: Secondary | ICD-10-CM | POA: Diagnosis not present

## 2020-02-21 ENCOUNTER — Other Ambulatory Visit: Payer: Self-pay | Admitting: Obstetrics & Gynecology

## 2020-02-21 DIAGNOSIS — R928 Other abnormal and inconclusive findings on diagnostic imaging of breast: Secondary | ICD-10-CM

## 2020-03-06 ENCOUNTER — Ambulatory Visit
Admission: RE | Admit: 2020-03-06 | Discharge: 2020-03-06 | Disposition: A | Discharge status: Home / Self Care | Payer: BC Managed Care – PPO | Source: Ambulatory Visit | Attending: Obstetrics & Gynecology | Admitting: Obstetrics & Gynecology

## 2020-03-06 ENCOUNTER — Other Ambulatory Visit: Payer: Self-pay | Admitting: Obstetrics & Gynecology

## 2020-03-06 ENCOUNTER — Other Ambulatory Visit: Payer: Self-pay

## 2020-03-06 DIAGNOSIS — R928 Other abnormal and inconclusive findings on diagnostic imaging of breast: Secondary | ICD-10-CM

## 2020-03-06 DIAGNOSIS — N6002 Solitary cyst of left breast: Secondary | ICD-10-CM | POA: Diagnosis not present

## 2020-03-06 DIAGNOSIS — N6315 Unspecified lump in the right breast, overlapping quadrants: Secondary | ICD-10-CM | POA: Diagnosis not present

## 2020-03-06 DIAGNOSIS — N6312 Unspecified lump in the right breast, upper inner quadrant: Secondary | ICD-10-CM | POA: Diagnosis not present

## 2020-03-06 DIAGNOSIS — N6321 Unspecified lump in the left breast, upper outer quadrant: Secondary | ICD-10-CM | POA: Diagnosis not present

## 2020-03-12 ENCOUNTER — Other Ambulatory Visit: Payer: Self-pay | Admitting: Family Medicine

## 2020-03-15 ENCOUNTER — Ambulatory Visit
Admission: RE | Admit: 2020-03-15 | Discharge: 2020-03-15 | Disposition: A | Discharge status: Home / Self Care | Payer: BC Managed Care – PPO | Source: Ambulatory Visit | Attending: Obstetrics & Gynecology | Admitting: Obstetrics & Gynecology

## 2020-03-15 ENCOUNTER — Other Ambulatory Visit: Payer: Self-pay

## 2020-03-15 ENCOUNTER — Other Ambulatory Visit: Payer: Self-pay | Admitting: Obstetrics & Gynecology

## 2020-03-15 DIAGNOSIS — R928 Other abnormal and inconclusive findings on diagnostic imaging of breast: Secondary | ICD-10-CM

## 2020-03-15 DIAGNOSIS — N6321 Unspecified lump in the left breast, upper outer quadrant: Secondary | ICD-10-CM | POA: Diagnosis not present

## 2020-03-15 DIAGNOSIS — C50911 Malignant neoplasm of unspecified site of right female breast: Secondary | ICD-10-CM | POA: Diagnosis not present

## 2020-03-15 DIAGNOSIS — C50912 Malignant neoplasm of unspecified site of left female breast: Secondary | ICD-10-CM | POA: Diagnosis not present

## 2020-03-15 DIAGNOSIS — C50919 Malignant neoplasm of unspecified site of unspecified female breast: Secondary | ICD-10-CM

## 2020-03-15 DIAGNOSIS — N6312 Unspecified lump in the right breast, upper inner quadrant: Secondary | ICD-10-CM | POA: Diagnosis not present

## 2020-03-15 DIAGNOSIS — Z170 Estrogen receptor positive status [ER+]: Secondary | ICD-10-CM | POA: Diagnosis not present

## 2020-03-15 HISTORY — DX: Malignant neoplasm of unspecified site of unspecified female breast: C50.919

## 2020-03-22 NOTE — Progress Notes (Signed)
Phone [phone] In person visit   Subjective:   [name] [name] is a 64 y.o. year old very pleasant female patient who presents for/with See problem oriented charting Chief Complaint  Patient presents with  . Breast Cancer Diagnosis   This visit occurred during the SARS-CoV-2 public health emergency.  Safety protocols were in place, including screening questions prior to the visit, additional usage of staff PPE, and extensive cleaning of exam room while observing appropriate contact time as indicated for disinfecting solutions.   Past Medical History-  Patient Active Problem List   Diagnosis Date Noted  . Bilateral breast cancer ([place]) 03/24/2020    Priority: High  . TOBACCO ABUSE 09/30/2008    Priority: High  . Hypertension 10/20/2017    Priority: Medium  . History of squamous cell carcinoma of skin 09/12/2016    Priority: Medium  . 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 Adjustment disorder with mixed anxiety and depressed mood 08/17/2012    Priority: Medium  . Hot flashes 03/23/2012    Priority: Medium  . GERD 08/28/2009    Priority: Medium  . Abnormal glucose 08/28/2009    Priority: Medium  . PARESTHESIA 11/11/2008    Priority: Medium  . Hyperlipemia 09/30/2008    Priority: Medium  . Osteopenia 09/30/2008    Priority: Medium  . OSA (obstructive sleep apnea) 01/18/2013    Priority: Low  . History of colonic polyps 11/11/2008    Priority: Low  . Benign paroxysmal positional vertigo 10/25/2008    Priority: Low    Medications- reviewed and updated Current Outpatient Medications  Medication Sig Dispense Refill  . albuterol (VENTOLIN HFA) 108 (90 Base) MCG/ACT inhaler INHALE 2 PUFFS BY MOUTH EVERY 6 HOURS AS NEEDED 8.5 g 2  . amLODipine (NORVASC) 2.5 MG tablet Take 1 tablet (2.5 mg total) by mouth daily. 90 tablet 3  . atorvastatin (LIPITOR) 40 MG tablet Take 1 tablet (40 mg total) by mouth daily. 90 tablet 3  . azelastine (ASTELIN)  0.1 % nasal spray Place 2 sprays into both nostrils 2 (two) times daily. 30 mL 12  . citalopram (CELEXA) 20 MG tablet Take 1 tablet (20 mg total) by mouth daily. 90 tablet 3  . esomeprazole (NEXIUM) 40 MG capsule TAKE 1 CAPSULE BY MOUTH TWICE A DAY BEFORE A MEAL 180 capsule 3     Objective:  BP 128/74   Pulse 80   Temp 98.1 F (36.7 C) (Temporal)   Resp 18   Ht _0  (1.651 m)   Wt 210 lb 6.4 oz (95.4 kg)   SpO2 98%   BMI 35.01 kg/m  Gen: NAD, resting comfortably CV: RRR no murmurs rubs or gallops Lungs: CTAB no crackles, wheeze, rhonchi Abdomen: soft/nontender/nondistended/normal bowel sounds.  Ext: no edema Skin: warm, dry     Assessment and Plan  Bilateral Breast Cancer Diagnosis S: Patient was diagnosed with breast cancer 2 weeks ago and she just wants to make sure that everyone is on the same page.   Patient met with Dr. [name]. Both breast cancers are invasive- one is lobular. Good candidate for lumpectomy at present- getting breast MRI. Reportedly lymph nodes look good but they will still get samples with surgery. Left breast plan is to remove nipple area. Will get markers placed. Radiation 4-6 weeks planned.   She would like to consider seeing Dr. Lindi Adie but is flexible.   No family history- considering genetic testing  Was advised estradiol and progesterone  by gyn for low bone density prior to diagnosis (patient is not taking this). Also advised vitamin d -they will send me a copy  A/P: new bilateral breast cancer diagnosis- I thanked patient multiple times for making the time to get her mammogram which led to diagnosis. She is in capable hands of Dr. [name] and multidisciplinary team.   In regards to presurgical evaluation- No chest pain with flight or stairs or steep incline. Does have some mild SOB related to long term smoking. States can do flight of stairs more easily with albuterol- rarely uses a puff (before sleep can help her with cough in particular). Still  smoking 1.5 PPD strongly encouraged cessation today which would lower her CV risk with surgery  Bilateral breast cancer ([place]) Bilateral Breast Cancer Diagnosis S: Patient was diagnosed with breast cancer 2 weeks ago and she just wants to make sure that everyone is on the same page.   Patient met with Dr. [name]. Both breast cancers are invasive- one is lobular. Good candidate for lumpectomy at present- getting breast MRI. Reportedly lymph nodes look good but they will still get samples with surgery. Left breast plan is to remove nipple area. Will get markers placed. Radiation 4-6 weeks planned.   She would like to consider seeing Dr. Lindi Adie but is flexible.   No family history- considering genetic testing  Was advised estradiol and progesterone by gyn for low bone density prior to diagnosis (patient is not taking this). Also advised vitamin d -they will send me a copy  A/P: new bilateral breast cancer diagnosis- I thanked patient multiple times for making the time to get her mammogram which led to diagnosis. She is in capable hands of Dr. [name] and multidisciplinary team.   In regards to presurgical evaluation- No chest pain with flight or stairs or steep incline. Does have some mild SOB related to long term smoking. States can do flight of stairs more easily with albuterol- rarely uses a puff (before sleep can help her with cough in particular). Still smoking 1.5 PPD strongly encouraged cessation today which would lower her CV risk with surgery  TOBACCO ABUSE Smoking 1.5 PPD. Strongly encouraged cessation- patient not ready to quit at this time.  Patient could have some mild COPD-albuterol is beneficial for her and used before activity-she does not need this medication regularly.  I do not think from a pulmonary standpoint she will need further work-up prior to potential surgery though in the future we could refer to pulmonology for some baseline PFTs  Hypertension S: medication: amlodipine  2.5 mg BP Readings from Last 3 Encounters:  03/24/20 128/74  04/09/18 121/76  03/24/18 112/72  A/P: Stable. Continue current medications.      Hyperlipemia S: Medication: has not been taking atorvastatin 54m daily as prescribed  Lab Results  Component Value Date   CHOL 256 (H) 12/18/2017   HDL 38.70 (L) 12/18/2017   LDLCALC 139 (H) 01/13/2015   LDLDIRECT 190.0 12/18/2017   TRIG 238.0 (H) 12/18/2017   CHOLHDL 7 12/18/2017   A/P: prior lipids elevated- encouraged patient to take statin particularly around surgery.  I would like to recheck lipid panel next time we see her in the office once she has been taking insulin consistently    Adjustment disorder with mixed anxiety and depressed mood S: Medication: Celexa 20 mg Depression screen PMid [facility]2/9 03/24/2020 10/13/2018 12/18/2017  Decreased Interest 2 0 0  Down, Depressed, Hopeless 2 0 0  PHQ - 2 Score 4 0  0  Altered sleeping 0 0 0  Tired, decreased energy 0 0 0  Change in appetite 0 0 3  Feeling bad or failure about yourself  0 0 0  Trouble concentrating 0 0 0  Moving slowly or fidgety/restless 0 0 0  Suicidal thoughts 0 0 0  PHQ-9 Score 4 0 3  Difficult doing work/chores - Not difficult at all Not difficult at all  A/P: PHQ-9 of 4-I think this is very reasonable considering patient with bilateral breast cancer diagnosis.  Continue citalopram 20 mg   #Health maintenance-flu shot given today  Recommended follow up: 36-monthphysical would be reasonable though we did not specifically discuss today.  I ordered labs for 6 weeks from now-team will call to set this up  Lab/Order associations:   ICD-10-CM   1. TOBACCO ABUSE  F17.200   2. Adjustment disorder with mixed anxiety and depressed mood  F43.23 citalopram (CELEXA) 20 MG tablet  3. Essential hypertension  I10 CBC With Differential/Platelet    COMPLETE METABOLIC PANEL WITH GFR    Lipid Panel (Refl)    POCT Urinalysis Dipstick (Automated)  4. Hyperlipidemia, unspecified  hyperlipidemia type  E78.5 CBC With Differential/Platelet    COMPLETE METABOLIC PANEL WITH GFR    Lipid Panel (Refl)    POCT Urinalysis Dipstick (Automated)  5. Bilateral malignant neoplasm of breast in female, unspecified estrogen receptor status, unspecified site of breast ([place]  C50.911    C50.912     Meds ordered this encounter  Medications  . amLODipine (NORVASC) 2.5 MG tablet    Sig: Take 1 tablet (2.5 mg total) by mouth daily.    Dispense:  90 tablet    Refill:  3  . atorvastatin (LIPITOR) 40 MG tablet    Sig: Take 1 tablet (40 mg total) by mouth daily.    Dispense:  90 tablet    Refill:  3  . citalopram (CELEXA) 20 MG tablet    Sig: Take 1 tablet (20 mg total) by mouth daily.    Dispense:  90 tablet    Refill:  3  . azelastine (ASTELIN) 0.1 % nasal spray    Sig: Place 2 sprays into both nostrils 2 (two) times daily.    Dispense:  30 mL    Refill:  12    Return precautions advised.  SGarret Reddish MD

## 2020-03-22 NOTE — Patient Instructions (Addendum)
Health Maintenance Due  Topic Date Due  . PAP SMEAR-Modifier  Sign release of information at the check out desk for a copy  09/21/2017  . INFLUENZA VACCINE -today  02/13/2020   Thank you so much it was so sweet of you to come see me and keep me updated!

## 2020-03-23 DIAGNOSIS — C50912 Malignant neoplasm of unspecified site of left female breast: Secondary | ICD-10-CM | POA: Diagnosis not present

## 2020-03-23 DIAGNOSIS — C50911 Malignant neoplasm of unspecified site of right female breast: Secondary | ICD-10-CM | POA: Diagnosis not present

## 2020-03-23 DIAGNOSIS — F17200 Nicotine dependence, unspecified, uncomplicated: Secondary | ICD-10-CM | POA: Diagnosis not present

## 2020-03-23 DIAGNOSIS — Z170 Estrogen receptor positive status [ER+]: Secondary | ICD-10-CM | POA: Diagnosis not present

## 2020-03-24 ENCOUNTER — Other Ambulatory Visit: Payer: Self-pay

## 2020-03-24 ENCOUNTER — Ambulatory Visit: Payer: BC Managed Care – PPO | Admitting: Family Medicine

## 2020-03-24 ENCOUNTER — Encounter: Payer: Self-pay | Admitting: Family Medicine

## 2020-03-24 VITALS — BP 128/74 | HR 80 | Temp 98.1°F | Resp 18 | Ht 65.0 in | Wt 210.4 lb

## 2020-03-24 DIAGNOSIS — E785 Hyperlipidemia, unspecified: Secondary | ICD-10-CM | POA: Diagnosis not present

## 2020-03-24 DIAGNOSIS — F4323 Adjustment disorder with mixed anxiety and depressed mood: Secondary | ICD-10-CM

## 2020-03-24 DIAGNOSIS — I10 Essential (primary) hypertension: Secondary | ICD-10-CM | POA: Diagnosis not present

## 2020-03-24 DIAGNOSIS — C50912 Malignant neoplasm of unspecified site of left female breast: Secondary | ICD-10-CM

## 2020-03-24 DIAGNOSIS — Z23 Encounter for immunization: Secondary | ICD-10-CM

## 2020-03-24 DIAGNOSIS — F17200 Nicotine dependence, unspecified, uncomplicated: Secondary | ICD-10-CM

## 2020-03-24 DIAGNOSIS — C50911 Malignant neoplasm of unspecified site of right female breast: Secondary | ICD-10-CM | POA: Diagnosis not present

## 2020-03-24 MED ORDER — ATORVASTATIN CALCIUM 40 MG PO TABS
40.0000 mg | ORAL_TABLET | Freq: Every day | ORAL | 3 refills | Status: DC
Start: 2020-03-24 — End: 2021-03-27

## 2020-03-24 MED ORDER — AMLODIPINE BESYLATE 2.5 MG PO TABS
2.5000 mg | ORAL_TABLET | Freq: Every day | ORAL | 3 refills | Status: DC
Start: 2020-03-24 — End: 2021-11-26

## 2020-03-24 MED ORDER — AZELASTINE HCL 0.1 % NA SOLN
2.0000 | Freq: Two times a day (BID) | NASAL | 12 refills | Status: DC
Start: 2020-03-24 — End: 2023-05-20

## 2020-03-24 MED ORDER — CITALOPRAM HYDROBROMIDE 20 MG PO TABS: 20.0000 mg | ORAL_TABLET | Freq: Every day | ORAL | 3 refills | Status: DC

## 2020-03-24 NOTE — Assessment & Plan Note (Signed)
S: Medication: Celexa 20 mg Depression screen [facility] 2/9 03/24/2020 10/13/2018 12/18/2017  Decreased Interest 2 0 0  Down, Depressed, Hopeless 2 0 0  PHQ - 2 Score 4 0 0  Altered sleeping 0 0 0  Tired, decreased energy 0 0 0  Change in appetite 0 0 3  Feeling bad or failure about yourself  0 0 0  Trouble concentrating 0 0 0  Moving slowly or fidgety/restless 0 0 0  Suicidal thoughts 0 0 0  PHQ-9 Score 4 0 3  Difficult doing work/chores - Not difficult at all Not difficult at all  A/P: PHQ-9 of 4-I think this is very reasonable considering patient with bilateral breast cancer diagnosis.  Continue citalopram 20 mg

## 2020-03-24 NOTE — Assessment & Plan Note (Signed)
S: medication: amlodipine 2.5 mg BP Readings from Last 3 Encounters:  03/24/20 128/74  04/09/18 121/76  03/24/18 112/72  A/P: Stable. Continue current medications.

## 2020-03-24 NOTE — Assessment & Plan Note (Signed)
Bilateral Breast Cancer Diagnosis S: Patient was diagnosed with breast cancer 2 weeks ago and she just wants to make sure that everyone is on the same page.   Patient met with Dr. [name]. Both breast cancers are invasive- one is lobular. Good candidate for lumpectomy at present- getting breast MRI. Reportedly lymph nodes look good but they will still get samples with surgery. Left breast plan is to remove nipple area. Will get markers placed. Radiation 4-6 weeks planned.   She would like to consider seeing Dr. Lindi Adie but is flexible.   No family history- considering genetic testing  Was advised estradiol and progesterone by gyn for low bone density prior to diagnosis (patient is not taking this). Also advised vitamin d -they will send me a copy  A/P: new bilateral breast cancer diagnosis- I thanked patient multiple times for making the time to get her mammogram which led to diagnosis. She is in capable hands of Dr. [name] and multidisciplinary team.   In regards to presurgical evaluation- No chest pain with flight or stairs or steep incline. Does have some mild SOB related to long term smoking. States can do flight of stairs more easily with albuterol- rarely uses a puff (before sleep can help her with cough in particular). Still smoking 1.5 PPD strongly encouraged cessation today which would lower her CV risk with surgery

## 2020-03-24 NOTE — Assessment & Plan Note (Addendum)
Smoking 1.5 PPD. Strongly encouraged cessation- patient not ready to quit at this time.  Patient could have some mild COPD-albuterol is beneficial for her and used before activity-she does not need this medication regularly.  I do not think from a pulmonary standpoint she will need further work-up prior to potential surgery though in the future we could refer to pulmonology for some baseline PFTs

## 2020-03-24 NOTE — Assessment & Plan Note (Signed)
S: Medication: has not been taking atorvastatin 40mg  daily as prescribed  Lab Results  Component Value Date   CHOL 256 (H) 12/18/2017   HDL 38.70 (L) 12/18/2017   LDLCALC 139 (H) 01/13/2015   LDLDIRECT 190.0 12/18/2017   TRIG 238.0 (H) 12/18/2017   CHOLHDL 7 12/18/2017   A/P: prior lipids elevated- encouraged patient to take statin particularly around surgery.  I would like to recheck lipid panel next time we see her in the office once she has been taking insulin consistently

## 2020-03-27 ENCOUNTER — Telehealth: Payer: Self-pay | Admitting: Hematology and Oncology

## 2020-03-27 NOTE — Telephone Encounter (Signed)
Received a new pt referral from Dr. [name] at [place] for dx of breast cancer. Ms. epling has been cld and sheduled to see Dr. Lindi Adie on 9/14 at 345pm. Pt aware to arrive 30 minutes early.

## 2020-03-27 NOTE — Progress Notes (Signed)
[place] CONSULT NOTE  Patient Care Team: [name], MD as PCP - General (Family Medicine)  CHIEF COMPLAINTS/PURPOSE OF CONSULTATION:  Newly diagnosed breast cancer  HISTORY OF PRESENTING ILLNESS:  [name] [name] 64 y.o. female is here because of recent diagnosis of invasive carcinoma of the bilateral breasts. Screening mammogram detected a right breast mass and left breast distortion. Diagnostic mammogram on 03/06/20 showed a 2.5cm mass in the left breast at the 1 o'clock position with likely skin involvement in the nipple-areola complex, a 0.9cm right breast mass at the 12 o'clock position, and no axillary adenopathy. 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 She presents to the clinic today for initial evaluation and discussion of treatment options.   I reviewed her records extensively and collaborated the history with the patient.  SUMMARY OF ONCOLOGIC HISTORY: Oncology History  Bilateral breast cancer ([place])  03/15/2020 Initial Diagnosis   Right breast, 0.9cm right breast mass at the 12 o'clock position, and no axillary adenopathy. grade 1 invasive ductal carcinoma with high grade DCIS, HER-2 equivocal by IHC, negative by FISH, ER+ 100%, PR+ 100%, Ki67 2% T1BN0 stage Ia left breast: 2.5cm mass in the left breast at the 1 o'clock position with likely skin involvement in the nipple-areola complex, Invasive lobular carcinoma, grade 2, HER-2 negative (1+), ER+ 80%, PR+ 50%, Ki67 2%. T4N0 stage IIIb   03/28/2020 Cancer Staging   Staging form: Breast, AJCC 8th Edition - Clinical stage from 03/28/2020: Stage IB (cT2, cN0, cM0, G1, ER+, PR+, HER2-) - Signed by Nicholas Lose, MD on 03/28/2020     MEDICAL HISTORY:  Past Medical History:   Diagnosis Date  . Abnormal glucose   . Anxiety   . Esophageal stricture    Dr.Dora Olevia Perches  . GERD (gastroesophageal reflux disease)   . History of colonic polyps   . Hyperlipidemia   . MVP (mitral valve prolapse)    history of   . Osteoporosis   . Tobacco abuse    tried Chantix- caused agression    SURGICAL HISTORY: Past Surgical History:  Procedure Laterality Date  . APPENDECTOMY  2008  . BREAST BIOPSY  2000  . COLONOSCOPY    . ESOPHAGOGASTRODUODENOSCOPY    . vocal cord polyps removed     [facility], Dr Erik Obey    SOCIAL HISTORY: Social History   Socioeconomic History  . Marital status: Married    Spouse name: Not on file  . Number of children: 2  . Years of education: Not on file  . Highest education level: Not on file  Occupational History  . Occupation: Chartered certified accountant   Tobacco Use  . Smoking status: Current Every Day Smoker    Packs/day: 1.50    Years: 36.00    Pack years: 54.00    Types: Cigarettes    Start date: 07/15/1978  . Smokeless tobacco: Never Used  Vaping Use  . Vaping Use: Never used  Substance and Sexual Activity  . Alcohol use: No    Alcohol/week: 0.0 standard drinks  . Drug use: No  . Sexual activity: Not Currently  Other Topics Concern  . Not on file  Social History Narrative   Married over 30 years. Sons 29,35 in 2017. 2 grandkis- 1 grandaughter -  Bolivar Haw . 1 grandson. Hudson lewis. All grandkids same son      Cared for mother before death in [id number], husband alcoholic- tremendous stress. Sister stays with mom- stressor      Occupation: office admin - [employer]    Social Determinants of Health   Financial Resource Strain:   . Difficulty of Paying Living Expenses: Not on file  Food Insecurity:   . Worried About Charity fundraiser in the Last Year: Not on file  . Ran Out of Food in the Last Year: Not on file  Transportation Needs:   . Lack of Transportation (Medical): Not on file  . Lack of Transportation  (Non-Medical): Not on file  Physical Activity:   . Days of Exercise per Week: Not on file  . Minutes of Exercise per Session: Not on file  Stress:   . Feeling of Stress : Not on file  Social Connections:   . Frequency of Communication with Friends and Family: Not on file  . Frequency of Social Gatherings with Friends and Family: Not on file  . Attends Religious Services: Not on file  . Active Member of Clubs or Organizations: Not on file  . Attends Archivist Meetings: Not on file  . Marital Status: Not on file  Intimate Partner Violence:   . Fear of Current or Ex-Partner: Not on file  . Emotionally Abused: Not on file  . Physically Abused: Not on file  . Sexually Abused: Not on file    FAMILY HISTORY: Family History  Problem Relation Age of Onset  . Hyperlipidemia Other   . Hiatal hernia Other        several family members  . Hypertension Mother   . Cancer Mother        apendix. right hemicolectomy  . Other Mother        MGUS  . Alcohol abuse Father   . Cirrhosis Father        deceased form cirrhosis  . Hypertension Father   . Other Other        Fh of abnormal glucose  . Hypertension Sister   . Hypertension Brother   . Colon cancer Neg Hx   . Esophageal cancer Neg Hx   . Rectal cancer Neg Hx     ALLERGIES:  is allergic to penicillins.  MEDICATIONS:  Current Outpatient Medications  Medication Sig Dispense Refill  . albuterol (VENTOLIN HFA) 108 (90 Base) MCG/ACT inhaler INHALE 2 PUFFS BY MOUTH EVERY 6 HOURS AS NEEDED 8.5 g 2  . amLODipine (NORVASC) 2.5 MG tablet Take 1 tablet (2.5 mg total) by mouth daily. 90 tablet 3  . atorvastatin (LIPITOR) 40 MG tablet Take 1 tablet (40 mg total) by mouth daily. 90 tablet 3  . azelastine (ASTELIN) 0.1 % nasal spray Place 2 sprays into both nostrils 2 (two) times daily. 30 mL 12  . citalopram (CELEXA) 20 MG tablet Take 1 tablet (20 mg total) by mouth daily. 90 tablet 3  . esomeprazole (NEXIUM) 40 MG capsule TAKE 1  CAPSULE BY MOUTH TWICE A DAY BEFORE A MEAL 180 capsule 3   No current facility-administered medications for this visit.    REVIEW OF SYSTEMS:    All other systems were reviewed with the patient and are negative.  PHYSICAL EXAMINATION: ECOG PERFORMANCE STATUS: 1 - Symptomatic but completely ambulatory  Vitals:   03/28/20 1529  BP: (!) 143/70  Pulse: 80  Resp: 17  Temp: (!) 97.2 F (36.2 C)  SpO2: 96%   Filed Weights   03/28/20 1529  Weight: 211 lb 8 oz (95.9 kg)     BREAST: Palpable lump in the left breast with skin dimpling. No palpable axillary or supraclavicular lymphadenopathy (exam performed in the presence of a chaperone)   LABORATORY DATA:  I have reviewed the data as listed Lab Results  Component Value Date   WBC 13.3 (H) 12/18/2017   HGB 15.1 (H) 12/18/2017   HCT 44.6 12/18/2017   MCV 89.7 12/18/2017   PLT 240.0 12/18/2017   Lab Results  Component Value Date   NA 140 12/18/2017   K 4.2 12/18/2017   CL 100 12/18/2017   CO2 27 12/18/2017    RADIOGRAPHIC STUDIES: I have personally reviewed the radiological reports and agreed with the findings in the report.  ASSESSMENT AND PLAN:  Bilateral breast cancer ([place]) 03/15/2020: Right breast, 0.9cm right breast mass at the 12 o'clock position, and no axillary adenopathy. grade 1 invasive ductal carcinoma with high grade DCIS, HER-2 equivocal by IHC, negative by FISH, ER+ 100%, PR+ 100%, Ki67 2% T1BN0 stage Ia left breast: 2.5cm mass in the left breast at the 1 o'clock position with likely skin involvement in the nipple-areola complex, Invasive lobular carcinoma, grade 2, HER-2 negative (1+), ER+ 80%, PR+ 50%, Ki67 2%. T4N0 stage Ib  Pathology and radiology counseling: Discussed with the patient, the details of pathology including the type of breast cancer,the clinical staging, the significance of ER, PR and HER-2/neu receptors and the implications for treatment. After reviewing the pathology in detail, we proceeded  to discuss the different treatment options between surgery, radiation, chemotherapy, antiestrogen therapies.  Recommendations: 1.  Bilateral lumpectomies with sentinel lymph node biopsies 2. Oncotype DX testing to determine if chemotherapy would be of any benefit followed by 3. Adjuvant radiation therapy followed by 4. Adjuvant antiestrogen therapy  Oncotype counseling: I discussed Oncotype DX test. I explained to the patient that this is a 21 gene panel to evaluate patient tumors DNA to calculate recurrence score. This would help determine whether patient has high risk or intermediate risk or low risk breast cancer. She understands that if her tumor was found to be high risk, she would benefit from systemic chemotherapy. If low risk, no need of chemotherapy. If she was found to be intermediate risk, we would need to evaluate the score as well as other risk factors and determine if an abbreviated chemotherapy may be of benefit.  Return to clinic after surgery to discuss final pathology report and then determine if Oncotype DX testing will need to be sent.  We will obtain CT chest abdomen pelvis with contrast for evaluation of metastatic disease because of her bilateral breast cancers   All questions were answered. The patient knows to call the clinic with any problems, questions or concerns.   Rulon Eisenmenger, MD, MPH 03/28/2020    I, Molly Dorshimer, am acting as scribe for Nicholas Lose, MD.  I have reviewed the above documentation for accuracy and completeness, and I agree with the above.

## 2020-03-28 ENCOUNTER — Inpatient Hospital Stay: Payer: BC Managed Care – PPO | Attending: Hematology and Oncology | Admitting: Hematology and Oncology

## 2020-03-28 ENCOUNTER — Encounter: Payer: Self-pay | Admitting: *Deleted

## 2020-03-28 ENCOUNTER — Other Ambulatory Visit: Payer: Self-pay | Admitting: Surgery

## 2020-03-28 ENCOUNTER — Other Ambulatory Visit: Payer: Self-pay

## 2020-03-28 VITALS — BP 143/70 | HR 80 | Temp 97.2°F | Resp 17 | Ht 65.0 in | Wt 211.5 lb

## 2020-03-28 DIAGNOSIS — E785 Hyperlipidemia, unspecified: Secondary | ICD-10-CM | POA: Diagnosis not present

## 2020-03-28 DIAGNOSIS — K219 Gastro-esophageal reflux disease without esophagitis: Secondary | ICD-10-CM | POA: Diagnosis not present

## 2020-03-28 DIAGNOSIS — Z8509 Personal history of malignant neoplasm of other digestive organs: Secondary | ICD-10-CM | POA: Insufficient documentation

## 2020-03-28 DIAGNOSIS — C50111 Malignant neoplasm of central portion of right female breast: Secondary | ICD-10-CM | POA: Diagnosis not present

## 2020-03-28 DIAGNOSIS — M810 Age-related osteoporosis without current pathological fracture: Secondary | ICD-10-CM | POA: Insufficient documentation

## 2020-03-28 DIAGNOSIS — C50112 Malignant neoplasm of central portion of left female breast: Secondary | ICD-10-CM | POA: Diagnosis not present

## 2020-03-28 DIAGNOSIS — F17210 Nicotine dependence, cigarettes, uncomplicated: Secondary | ICD-10-CM | POA: Diagnosis not present

## 2020-03-28 DIAGNOSIS — F419 Anxiety disorder, unspecified: Secondary | ICD-10-CM | POA: Insufficient documentation

## 2020-03-28 DIAGNOSIS — I341 Nonrheumatic mitral (valve) prolapse: Secondary | ICD-10-CM | POA: Insufficient documentation

## 2020-03-28 DIAGNOSIS — C50912 Malignant neoplasm of unspecified site of left female breast: Secondary | ICD-10-CM

## 2020-03-28 DIAGNOSIS — Z170 Estrogen receptor positive status [ER+]: Secondary | ICD-10-CM | POA: Diagnosis not present

## 2020-03-28 NOTE — Assessment & Plan Note (Addendum)
03/15/2020: Right breast, 0.9cm right breast mass at the 12 o'clock position, and no axillary adenopathy. grade 1 invasive ductal carcinoma with high grade DCIS, HER-2 equivocal by IHC, negative by FISH, ER+ 100%, PR+ 100%, Ki67 2% T1BN0 stage Ia left breast: 2.5cm mass in the left breast at the 1 o'clock position with likely skin involvement in the nipple-areola complex, Invasive lobular carcinoma, grade 2, HER-2 negative (1+), ER+ 80%, PR+ 50%, Ki67 2%. T4N0 stage Ib  Pathology and radiology counseling: Discussed with the patient, the details of pathology including the type of breast cancer,the clinical staging, the significance of ER, PR and HER-2/neu receptors and the implications for treatment. After reviewing the pathology in detail, we proceeded to discuss the different treatment options between surgery, radiation, chemotherapy, antiestrogen therapies.

## 2020-03-29 ENCOUNTER — Other Ambulatory Visit: Payer: Self-pay | Admitting: *Deleted

## 2020-03-29 DIAGNOSIS — Z170 Estrogen receptor positive status [ER+]: Secondary | ICD-10-CM

## 2020-03-29 DIAGNOSIS — C50111 Malignant neoplasm of central portion of right female breast: Secondary | ICD-10-CM

## 2020-03-31 ENCOUNTER — Telehealth: Payer: Self-pay | Admitting: *Deleted

## 2020-03-31 ENCOUNTER — Telehealth: Payer: Self-pay | Admitting: Hematology and Oncology

## 2020-03-31 ENCOUNTER — Encounter: Payer: Self-pay | Admitting: *Deleted

## 2020-03-31 NOTE — Telephone Encounter (Signed)
Called pt to provide navigation resources and contact information. Left vm requesting return call.

## 2020-03-31 NOTE — Telephone Encounter (Signed)
Scheduled appt per 9/17 sch msg - pt is aware of apt date and time

## 2020-03-31 NOTE — Telephone Encounter (Signed)
Spoke to pt and provided navigation resources and contact information. Discussed next steps and confirmed future appts. Pt with questions regarding nutrition and diet. Informed pt referral will be made to dietician. Denies further questions or needs at this time.

## 2020-04-02 ENCOUNTER — Other Ambulatory Visit: Payer: Self-pay

## 2020-04-02 ENCOUNTER — Ambulatory Visit
Admission: RE | Admit: 2020-04-02 | Discharge: 2020-04-02 | Disposition: A | Discharge status: Home / Self Care | Payer: BC Managed Care – PPO | Source: Ambulatory Visit | Attending: Surgery | Admitting: Surgery

## 2020-04-02 DIAGNOSIS — C50912 Malignant neoplasm of unspecified site of left female breast: Secondary | ICD-10-CM | POA: Diagnosis not present

## 2020-04-02 DIAGNOSIS — C50411 Malignant neoplasm of upper-outer quadrant of right female breast: Secondary | ICD-10-CM | POA: Diagnosis not present

## 2020-04-02 DIAGNOSIS — N6453 Retraction of nipple: Secondary | ICD-10-CM | POA: Diagnosis not present

## 2020-04-02 MED ORDER — GADOBUTROL 1 MMOL/ML IV SOLN
10.0000 mL | Freq: Once | INTRAVENOUS | Status: AC | PRN
  Administered 2020-04-02: 10 mL via INTRAVENOUS

## 2020-04-03 ENCOUNTER — Telehealth: Payer: Self-pay | Admitting: Family Medicine

## 2020-04-03 ENCOUNTER — Telehealth: Payer: Self-pay | Admitting: Hematology and Oncology

## 2020-04-03 ENCOUNTER — Encounter: Payer: Self-pay | Admitting: Physical Therapy

## 2020-04-03 ENCOUNTER — Encounter: Payer: Self-pay | Admitting: Hematology and Oncology

## 2020-04-03 ENCOUNTER — Other Ambulatory Visit: Payer: Self-pay

## 2020-04-03 ENCOUNTER — Ambulatory Visit: Payer: BC Managed Care – PPO | Attending: Surgery | Admitting: Physical Therapy

## 2020-04-03 ENCOUNTER — Encounter: Payer: Self-pay | Admitting: *Deleted

## 2020-04-03 ENCOUNTER — Other Ambulatory Visit: Payer: Self-pay | Admitting: *Deleted

## 2020-04-03 DIAGNOSIS — R293 Abnormal posture: Secondary | ICD-10-CM | POA: Diagnosis not present

## 2020-04-03 DIAGNOSIS — C50912 Malignant neoplasm of unspecified site of left female breast: Secondary | ICD-10-CM

## 2020-04-03 DIAGNOSIS — C50111 Malignant neoplasm of central portion of right female breast: Secondary | ICD-10-CM

## 2020-04-03 DIAGNOSIS — C50911 Malignant neoplasm of unspecified site of right female breast: Secondary | ICD-10-CM | POA: Diagnosis not present

## 2020-04-03 DIAGNOSIS — C50112 Malignant neoplasm of central portion of left female breast: Secondary | ICD-10-CM

## 2020-04-03 NOTE — Telephone Encounter (Signed)
LVM for patient to call back to schedule 6 week f.u on labs.

## 2020-04-03 NOTE — Therapy (Signed)
[place], [place], [address] Phone: [phone]   Fax:  [phone]  Physical Therapy Treatment  Patient Details  Name: [name] [name] MRN: [id number] Date of Birth: May 26, 1956 Referring Provider (PT): [name]   Encounter Date: 04/03/2020   PT End of Session - 04/03/20 1051    Visit Number 1    Number of Visits 2    Date for PT Re-Evaluation 05/29/20    PT Start Time 1005    PT Stop Time 1050    PT Time Calculation (min) 45 min    Activity Tolerance Patient tolerated treatment well    Behavior During Therapy [facility] for tasks assessed/performed           Past Medical History:  Diagnosis Date  . Abnormal glucose   . Anxiety   . Esophageal stricture    Dr.Dora Olevia Perches  . GERD (gastroesophageal reflux disease)   . History of colonic polyps   . Hyperlipidemia   . MVP (mitral valve prolapse)    history of   . Osteoporosis   . Tobacco abuse    tried Chantix- caused agression    Past Surgical History:  Procedure Laterality Date  . APPENDECTOMY  2008  . BREAST BIOPSY  2000  . COLONOSCOPY    . ESOPHAGOGASTRODUODENOSCOPY    . vocal cord polyps removed     [facility], Dr Erik Obey    There were no vitals filed for this visit.   Subjective Assessment - 04/03/20 1006    Subjective I do not have any issues with my shoulders.    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 No/denies              [facility] PT Assessment - 04/03/20 0001      Assessment   Medical Diagnosis bilateral breast cancer    Referring Provider (PT) [name]    Onset Date/Surgical Date 03/15/20    Hand Dominance Right    Prior Therapy none       Precautions   Precautions Other (comment)    Precaution Comments active cancer      Restrictions   Weight Bearing Restrictions No      Balance Screen   Has the patient fallen in the past 6 months No    Has the patient had a decrease in activity level because of a fear of falling?  No    Is the patient reluctant to leave their home because of a fear of falling?  No      Home Environment   Living Environment Private residence    Living Arrangements Spouse/significant other    Available Help at Discharge Family    Type of [place]      Prior Function   Level of [place] Full time employment    Facilities manager, desk work - deadline driven    Leisure pt does not currently exercise      Cognition   Overall Cognitive Status Within Functional Limits for tasks assessed      Posture/Postural Control   Posture/Postural Control Postural limitations    Postural Limitations Rounded Shoulders;Forward head      ROM /  Strength   AROM / PROM / Strength AROM      AROM   AROM Assessment Site Shoulder    Right/Left Shoulder Right;Left    Right Shoulder Flexion 166 Degrees    Right Shoulder ABduction 178 Degrees    Right Shoulder Internal Rotation 58 Degrees    Right Shoulder External Rotation 80 Degrees    Left Shoulder Flexion 167 Degrees    Left Shoulder ABduction 172 Degrees    Left Shoulder Internal Rotation 71 Degrees    Left Shoulder External Rotation 87 Degrees             LYMPHEDEMA/ONCOLOGY QUESTIONNAIRE - 04/03/20 0001      Type   Cancer Type bilateral breast cancer      Lymphedema Assessments   Lymphedema Assessments Upper extremities      Right Upper Extremity Lymphedema   15 cm Proximal to Olecranon Process 33 cm    Olecranon Process 26.5 cm    15 cm Proximal to Ulnar Styloid Process 25 cm    Just Proximal to Ulnar Styloid Process 16.1 cm    Across Hand at [employer] 18.8 cm    At [place] of  2nd Digit 6.4 cm      Left Upper Extremity Lymphedema   15 cm Proximal to Olecranon Process 34 cm    Olecranon Process 27 cm    15 cm Proximal to Ulnar Styloid Process 25 cm    Just Proximal to Ulnar Styloid Process 16.6 cm    Across Hand at [employer] 18.7 cm    At [place] of 2nd Digit 6 cm           L-DEX FLOWSHEETS - 04/03/20 1000      L-DEX LYMPHEDEMA SCREENING   Measurement Type Bilateral    L-DEX MEASUREMENT EXTREMITY Upper Extremity    POSITION  Standing    DOMINANT SIDE Right    At Risk Side Left   and Right   BASELINE RIGHT 1.1    BASELINE LEFT 1.2                The patient was assessed using the L-Dex machine today to produce a lymphedema index baseline score. The patient will be reassessed on a regular basis (typically every 3 months) to obtain new L-Dex scores. If the score is > 6.5 points away from his/her baseline score indicating onset of subclinical lymphedema, it will be recommended to wear a compression garment for 4 weeks, 12 hours per day and then be reassessed. If the score continues to be > 6.5 points from baseline at reassessment, we will initiate lymphedema treatment. Assessing in this manner has a 95% rate of preventing clinically significant lymphedema.               PT Education - 04/03/20 1055    Education Details post op breast exercises, anatomy and physiology of lymphedema, lymphedema risk reduction practices, ABC class    Person(s) Educated Patient    Methods Explanation;Handout    Comprehension Verbalized understanding               PT Long Term Goals - 04/03/20 1054      PT LONG TERM GOAL #1   Title Pt will return to baseline ROM measurements to allow pt to return to PLOF.    Time 8    Period Weeks    Status New    Target Date 05/29/20  Breast Clinic Goals - 04/03/20 1054      Patient will be able to verbalize understanding of pertinent lymphedema risk reduction practices relevant to her diagnosis  specifically related to skin care.   Status Achieved      Patient will be able to return demonstrate and/or verbalize understanding of the post-op home exercise program related to regaining shoulder range of motion.   Status Achieved      Patient will be able to verbalize understanding of the importance of attending the postoperative After Breast Cancer Class for further lymphedema risk reduction education and therapeutic exercise.   Status Achieved                Plan - 04/03/20 1051    Clinical Impression Statement Pt presents to PT with recent diagnosis of bilateral breast cancer. She will have to undergo bilateral lumpectomies and SLNB followed by adjuvant radiation and antiestrogen therapy. Her surgery date has not been scheduled yet.Pt was instructed in post op exercises and educated that if she has a mastectomy then not to begin exercises until a week after the last drain is removed. Baseline ROM and SOZO measurements taken today. She will benefit from a post op PT reassessment to determine needs and in every 3 months for additonal L dex screening to detect subclinical lymphedema.    Stability/Clinical Decision Making Stable/Uncomplicated    Clinical Decision Making Low    Rehab Potential Good    PT Frequency --   eval and 1 f/u visit   PT Duration 8 weeks    PT Treatment/Interventions ADLs/Self Care Home Management;Therapeutic exercise;Patient/family education    PT Next Visit Plan reassess baselines and be sure pt signed up for ABC class    PT Home Exercise Plan post op breast exercises    Consulted and Agree with Plan of Care Patient           Patient will benefit from skilled therapeutic intervention in order to improve the following deficits and impairments:  Postural dysfunction, Decreased knowledge of precautions  Visit Diagnosis: Abnormal posture  Malignant neoplasm of right female breast, unspecified estrogen receptor status, unspecified site of breast  ([place])  Malignant neoplasm of left female breast, unspecified estrogen receptor status, unspecified site of breast [facility])  Patient will follow up at outpatient cancer rehab 3-4 weeks following surgery.  If the patient requires physical therapy at that time, a specific plan will be dictated and sent to the referring physician for approval. The patient was educated today on appropriate basic range of motion exercises to begin post operatively and the importance of attending the After Breast Cancer class following surgery.  Patient was educated today on lymphedema risk reduction practices as it pertains to recommendations that will benefit the patient immediately following surgery.  She verbalized good understanding.      Problem List Patient Active Problem List   Diagnosis Date Noted  . Bilateral breast cancer ([place]) 03/24/2020  . Hypertension 10/20/2017  . History of squamous cell carcinoma of skin 09/12/2016  . 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 OSA (obstructive sleep apnea) 01/18/2013  . Adjustment disorder with mixed anxiety and depressed mood 08/17/2012  . Hot flashes 03/23/2012  . GERD 08/28/2009  . Abnormal glucose 08/28/2009  . PARESTHESIA 11/11/2008  . History of colonic polyps 11/11/2008  . Benign paroxysmal positional vertigo 10/25/2008  . Hyperlipemia 09/30/2008  . TOBACCO ABUSE 09/30/2008  . Osteopenia 09/30/2008    Allyson Sabal Blue 04/03/2020, 10:56 AM  [facility]  [place], [place], [address] Phone: [phone]   Fax:  [phone]  Name: DARRA ROSA MRN: [id number] Date of Birth: 1956/06/30  Manus Gunning, PT 04/03/20 10:56 AM

## 2020-04-03 NOTE — Progress Notes (Signed)
Location of Breast Cancer: Malignant neoplasm of central portion of both breasts  Did patient present with symptoms (if so, please note symptoms) or was this found on screening mammography?: Routine Mammogram  Diagnostic Mammogram 03/06/2020: 2.5 cm mass in the left breast at the 1 o'clock position with likely skin involvement in the nipple-areola complex, a 0.9 cm right breast mass at the 12 o'clock position, and no axillary adenopathy.   Screening Mammogram: Right breast mass and left breast distortion.  Histology per Pathology Report: Right & Left Breast  Right Breast Receptor Status: ER(+ 100%), PR (+ 100%), Her2-neu (-), Ki-67(2%)  Left Breast Receptor Status: ER(+ 80%), PR (+ 50%), Her2-neu (-), Ki-67(2%)   Past/Anticipated interventions by surgeon, if any: Dr. [name]  -Breast MRI -Medical and radiation oncology consultation -PT consult -Possible bilateral lumpectomies with bilateral sentinel lymph node biopsies.  Past/Anticipated interventions by medical oncology, if any: Chemotherapy  Dr. Lindi Adie 03/28/2020 Recommendations: 1.  Bilateral lumpectomies with sentinel lymph node biopsies 2. Oncotype DX testing to determine if chemotherapy would be of any benefit followed by 3. Adjuvant radiation therapy followed by 4. Adjuvant antiestrogen therapy -Return to clinic after surgery to discuss final pathology report and then determine if Oncotype DX testing will need to be sent.  Lymphedema issues, if any:  no  Pain issues, if any:  no  SAFETY ISSUES:  Prior radiation? No  Pacemaker/ICD? No  Possible current pregnancy? Postmenopausal  Is the patient on methotrexate? No  Current Complaints / other details:      Cori Razor, RN 04/03/2020,10:08 AM

## 2020-04-03 NOTE — Telephone Encounter (Signed)
Added lab before CT on 9/23 per sch msg.. called, not able to leave msg.

## 2020-04-04 ENCOUNTER — Ambulatory Visit
Admission: RE | Admit: 2020-04-04 | Discharge: 2020-04-04 | Disposition: A | Discharge status: Home / Self Care | Payer: BC Managed Care – PPO | Source: Ambulatory Visit | Attending: Radiation Oncology | Admitting: Radiation Oncology

## 2020-04-04 ENCOUNTER — Encounter: Payer: Self-pay | Admitting: Radiation Oncology

## 2020-04-04 ENCOUNTER — Other Ambulatory Visit: Payer: Self-pay

## 2020-04-04 ENCOUNTER — Telehealth: Payer: Self-pay | Admitting: Nutrition

## 2020-04-04 ENCOUNTER — Encounter: Payer: Self-pay | Admitting: *Deleted

## 2020-04-04 VITALS — Ht 65.0 in | Wt 210.0 lb

## 2020-04-04 DIAGNOSIS — Z1501 Genetic susceptibility to malignant neoplasm of breast: Secondary | ICD-10-CM | POA: Diagnosis not present

## 2020-04-04 DIAGNOSIS — C50811 Malignant neoplasm of overlapping sites of right female breast: Secondary | ICD-10-CM | POA: Diagnosis not present

## 2020-04-04 DIAGNOSIS — C50412 Malignant neoplasm of upper-outer quadrant of left female breast: Secondary | ICD-10-CM | POA: Diagnosis not present

## 2020-04-04 DIAGNOSIS — Z170 Estrogen receptor positive status [ER+]: Secondary | ICD-10-CM | POA: Diagnosis not present

## 2020-04-04 DIAGNOSIS — C50911 Malignant neoplasm of unspecified site of right female breast: Secondary | ICD-10-CM

## 2020-04-04 NOTE — Telephone Encounter (Signed)
Contacted patient to verify phone visit for pre reg °

## 2020-04-04 NOTE — Progress Notes (Signed)
Radiation Oncology         [phone] ________________________________  Name: [name] [name]        MRN: [id number]  Date of Service: 04/04/2020 DOB: 08/05/1955  AU:[id number], [name], MD  Alphonsa Overall, MD     REFERRING PHYSICIAN: Alphonsa Overall, MD   DIAGNOSIS: The encounter diagnosis was Bilateral malignant neoplasm of breast in female, unspecified estrogen receptor status, unspecified site of breast ([place]).   HISTORY OF PRESENT ILLNESS: [name] [name] is a 64 y.o. female seen  for a new diagnosis of bilateral breast cancer.  The patient was found to have a mass in the right breast and screening detected distortion in the left.  The patient underwent diagnostic imaging that revealed a mass at 12 o'clock in the right breast measuring 9 x 9 x 7 mm, her axilla was negative for adenopathy.  She had the distortion at the left breast at 2 o'clock but also measured 2.5 x 1.9 x 1.8 cm and her axilla was negative for adenopathy.  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  It was recommended in breast oncology conference that she undergo MRI scan for extent of disease, followed by breast conserving surgery of both breasts, though the left finding would require nipple to be resected.  It was also recommended that she have Oncotype, D genetics, and that she would benefit from adjuvant radiotherapy to bilateral breasts.  She has also undergone an MRI of the breasts on 04/02/2020, the known malignancy in the right breast measured 9 x 9 x 10 mm, the known malignancy in the left breast measured at least 3.8 x 2.5 x 2.5 cm extending to the nipple with mild skin thickening at the level  of the nipple, and there are multiple surrounding satellite lesions which would increase the total dimension to at least 5.6 x 5 cm, and no adenopathy was identified.  She is contacted today to discuss the rationale for adjuvant radiotherapy.   PREVIOUS RADIATION THERAPY: No   PAST MEDICAL HISTORY:  Past Medical History:  Diagnosis Date  . Abnormal glucose   . Anxiety   . Breast cancer ([place]) 03/2020  . Esophageal stricture    Dr.Dora Olevia Perches  . GERD (gastroesophageal reflux disease)   . History of colonic polyps   . Hyperlipidemia   . MVP (mitral valve prolapse)    history of   . Osteoporosis   . Tobacco abuse    tried Chantix- caused agression       PAST SURGICAL HISTORY: Past Surgical History:  Procedure Laterality Date  . APPENDECTOMY  2008  . BREAST BIOPSY  2000  . BREAST BIOPSY Bilateral 2021  . COLONOSCOPY    . ESOPHAGOGASTRODUODENOSCOPY    . vocal cord polyps removed     [facility], Dr Erik Obey     FAMILY HISTORY:  Family History  Problem Relation Age of Onset  . Hyperlipidemia Other   . Hiatal hernia Other        several family members  . Hypertension Mother   . Cancer Mother        apendix. right hemicolectomy  . Other Mother  MGUS  . Alcohol abuse Father   . Cirrhosis Father        deceased form cirrhosis  . Hypertension Father   . Other Other        Fh of abnormal glucose  . Hypertension Sister   . Hypertension Brother   . Colon cancer Neg Hx   . Esophageal cancer Neg Hx   . Rectal cancer Neg Hx      SOCIAL HISTORY:  reports that she has been smoking cigarettes. She started smoking about 41 years ago. She has a 54.00 pack-year smoking history. She has never used smokeless tobacco. She reports that she does not drink alcohol and does not use drugs. The patient is married and lives in [place]. 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 She has two small grandchildren she sees  often.   ALLERGIES: Penicillins   MEDICATIONS:  Current Outpatient Medications  Medication Sig Dispense Refill  . albuterol (VENTOLIN HFA) 108 (90 Base) MCG/ACT inhaler INHALE 2 PUFFS BY MOUTH EVERY 6 HOURS AS NEEDED 8.5 g 2  . amLODipine (NORVASC) 2.5 MG tablet Take 1 tablet (2.5 mg total) by mouth daily. 90 tablet 3  . atorvastatin (LIPITOR) 40 MG tablet Take 1 tablet (40 mg total) by mouth daily. 90 tablet 3  . azelastine (ASTELIN) 0.1 % nasal spray Place 2 sprays into both nostrils 2 (two) times daily. 30 mL 12  . citalopram (CELEXA) 20 MG tablet Take 1 tablet (20 mg total) by mouth daily. 90 tablet 3  . esomeprazole (NEXIUM) 40 MG capsule TAKE 1 CAPSULE BY MOUTH TWICE A DAY BEFORE A MEAL 180 capsule 3   No current facility-administered medications for this encounter.     REVIEW OF SYSTEMS: On review of systems, the patient reports that she is doing well overall. She is not sure how she would like to proceed surgically, and denies any specific breast complaints.    PHYSICAL EXAM:  Wt Readings from Last 3 Encounters:  04/04/20 210 lb (95.3 kg)  03/28/20 211 lb 8 oz (95.9 kg)  03/24/20 210 lb 6.4 oz (95.4 kg)   Temp Readings from Last 3 Encounters:  03/28/20 (!) 97.2 F (36.2 C) (Tympanic)  03/24/20 98.1 F (36.7 C) (Temporal)  10/13/18 97.9 F (36.6 C) (Oral)   BP Readings from Last 3 Encounters:  03/28/20 (!) 143/70  03/24/20 128/74  04/09/18 121/76   Pulse Readings from Last 3 Encounters:  03/28/20 80  03/24/20 80  04/09/18 (!) 59    In general this is a well appearing caucasian female in no acute distress. She's alert and oriented x4 and appropriate throughout the examination. Cardiopulmonary assessment is negative for acute distress and she exhibits normal effort. Bilateral breast exam is deferred.    ECOG = 1  0 - Asymptomatic (Fully active, able to carry on all predisease activities without restriction)  1 - Symptomatic but completely ambulatory  (Restricted in physically strenuous activity but ambulatory and able to carry out work of a light or sedentary nature. For example, light housework, office work)  2 - Symptomatic, <50% in bed during the day (Ambulatory and capable of all self care but unable to carry out any work activities. Up and about more than 50% of waking hours)  3 - Symptomatic, >50% in bed, but not bedbound (Capable of only limited self-care, confined to bed or chair 50% or more of waking hours)  4 - Bedbound (Completely disabled. Cannot  carry on any self-care. Totally confined to bed or chair)  5 - Death   Eustace Pen MM, Creech RH, Tormey DC, et al. [phone]). "Toxicity and response criteria of the [facility]". [place] Oncol. 5 (6): 649-55    LABORATORY DATA:  Lab Results  Component Value Date   WBC 13.3 (H) 12/18/2017   HGB 15.1 (H) 12/18/2017   HCT 44.6 12/18/2017   MCV 89.7 12/18/2017   PLT 240.0 12/18/2017   Lab Results  Component Value Date   NA 140 12/18/2017   K 4.2 12/18/2017   CL 100 12/18/2017   CO2 27 12/18/2017   Lab Results  Component Value Date   ALT 14 12/18/2017   AST 13 12/18/2017   ALKPHOS 111 12/18/2017   BILITOT 0.4 12/18/2017      RADIOGRAPHY: MR BREAST BILATERAL W WO CONTRAST INC CAD  Result Date: 04/03/2020 CLINICAL DATA:  Biopsy proven malignancy in both breasts. LABS:  None EXAM: BILATERAL BREAST MRI WITH AND WITHOUT CONTRAST TECHNIQUE: Multiplanar, multisequence MR images of both breasts were obtained prior to and following the intravenous administration of 10 ml of Gadavist Three-dimensional MR images were rendered by post-processing of the original MR data on an independent workstation. The three-dimensional MR images were interpreted, and findings are reported in the following complete MRI report for this study. Three dimensional images were evaluated at the independent interpreting workstation using the DynaCAD thin client. COMPARISON:  Previous  mammography FINDINGS: Breast composition: c. Heterogeneous fibroglandular tissue. Background parenchymal enhancement: Marked Right breast: The biopsy proven malignancy in the upper outer right breast at a mid depth measures 9 by 9 by 10 mm in AP, transverse, and craniocaudal dimensions with mixed enhancement kinetics. There is marked background enhancement throughout the remainder of the right breast but no other convincing evidence of malignancy. Left breast: There is marked background enhancement on the left, similar to the right, which limits evaluation. Within the background enhancement is a region of increased enhancement containing the biopsy clip and demonstrating associated distortion. It is difficult to confidently measure the malignancy given the amount of surrounding background enhancement. This malignancy measures at least 3.8 by 2.5 by 2.5 cm in AP, transverse, and craniocaudal dimension. The malignancy extends to the nipple with mild associated retraction. There also appears to be some mild thickening of the nipple. The enhancement extends at least to the under surface of the nipple. Skin involvement is not excluded. There are also possible surrounding satellite lesions. For example, there is a possible satellite lesion at 12 o'clock at a mid depth measuring 7 mm on series 7, image 55, 9 mm from the main malignancy. Another possible satellite lesion is seen medially on series 7, image 57 measuring 8 mm, 1.2 cm medial to the main portion of the malignancy. An additional possible satellite lesion is seen just superior and lateral to the known malignancy as noted on series 7, image 43. There may also be additional satellite lesions just inferolateral to the known malignancy on series 7, image 61. If all of the possible satellite lesions are indeed malignancy, the total span of malignancy would be at least 5.6 by 5.0 cm in AP and transverse dimensions. Lymph nodes: No abnormal appearing lymph nodes.  Ancillary findings:  None. IMPRESSION: 1. The known malignancy in the right breast measures 9 x 9 x 10 mm. No other definitive malignancy seen on the right. 2. The known malignancy on the left appears to measure at least 3.8 x 2.5  x 2.5 cm and extends to the nipple. There may be mild skin thickening at the level of the nipple. The enhancement associated with the known malignancy extends at least to the under surface of the nipple. Skin involvement is not excluded. It is difficult to confidently measure the known malignancy on the left due to the marked surrounding background enhancement. There is suspicion for multiple surrounding satellite lesions which would increase the total dimension to at least 5.6 x 5.0 cm. 3. No lymphadenopathy identified. RECOMMENDATION: Recommend continued surgical and oncologic follow up. If breast conservation is considered on the left, the patient may benefit from additional biopsies to better delineate extent of disease. BI-RADS CATEGORY  6: Known biopsy-proven malignancy. Electronically Signed   By: Dorise Bullion III M.D   On: 04/03/2020 14:38   US BREAST LTD UNI LEFT INC AXILLA  Result Date: 03/06/2020 CLINICAL DATA:  Recall from screening mammography with tomosynthesis, possible mass involving the UPPER INNER QUADRANT of the RIGHT breast and possible mass and architectural distortion involving UPPER LEFT breast. EXAM: DIGITAL DIAGNOSTIC BILATERAL MAMMOGRAM WITH TOMO LIMITED ULTRASOUND BILATERAL BREASTS COMPARISON:  Previous exam(s). ACR Breast Density Category c: The breast tissue is heterogeneously dense, which may obscure small masses. FINDINGS: Tomosynthesis and synthesized spot-compression CC and MLO views of the areas of concern in both breasts were obtained. RIGHT: Spot compression images confirm a mass with irregular margins measuring approximately 1 cm in size in the [place] at POSTERIOR depth. Subtle architectural distortion is associated with the mass.  Targeted ultrasound is performed, showing a hypoechoic mass with angular margins at the 12 o'clock position approximately 5 cm from the nipple measuring approximately 0.9 x 0.7 x 0.9 cm, demonstrating no posterior characteristics and demonstrating internal power Doppler flow, corresponding to the screening mammographic finding. Sonographic evaluation of the RIGHT axilla demonstrates no pathologic lymphadenopathy. LEFT: Spot compression images confirm an isodense mass measuring just over 1 cm in size whose margins appear relatively circumscribed in the UPPER breast at MIDDLE depth. Immediately ANTERIOR to this mass is persistent architectural distortion, likely associated an irregular mass. Targeted ultrasound is performed, showing an irregular hypoechoic mass at the 1 o'clock position approximately 2 cm from the nipple which extends up to the skin surface at the level of the nipple-areola complex, measuring approximately 2.5 x 1.9 x 1.8 cm, demonstrating dense acoustic shadowing and demonstrating internal power Doppler flow, accounting for the distortion on mammography. POSTERIOR to the dominant mass is a benign cyst measuring approximately 1.3 x 0.9 x 1.1 cm, demonstrating posterior acoustic enhancement and no internal power Doppler flow, accounting for the mass on mammography. Sonographic evaluation of the LEFT axilla demonstrates no pathologic lymphadenopathy. On correlative physical exam, there is palpable thickening in the UPPER OUTER periareolar LEFT breast with slight retraction of the LEFT nipple. There is no palpable abnormality in the UPPER RIGHT breast. IMPRESSION: 1. Highly suspicious approximate 2.5 cm mass involving the UPPER INNER periareolar LEFT breast at the 1 o'clock position approximately 2 cm with, with likely involvement of the skin of the nipple-areola complex. 2. Suspicious approximate 0.9 cm mass involving the UPPER RIGHT breast at the 12 o'clock position approximately 5 cm nipple. 3. No  pathologic lymphadenopathy involving either the RIGHT or LEFT axilla. 4. Benign cyst immediately POSTERIOR to the highly suspicious LEFT breast mass. RECOMMENDATION: Ultrasound-guided core needle biopsy of the highly suspicious LEFT breast mass and the suspicious RIGHT breast mass. The ultrasound core needle biopsy procedure was discussed with the patient  and her questions were answered. She wishes to proceed and the biopsy has been scheduled at her convenience. I have discussed the findings and recommendations with the patient. BI-RADS CATEGORY  5: Highly suggestive of malignancy. Electronically Signed   By: Evangeline Dakin M.D.   On: 03/06/2020 12:35   US BREAST LTD UNI RIGHT INC AXILLA  Result Date: 03/06/2020 CLINICAL DATA:  Recall from screening mammography with tomosynthesis, possible mass involving the UPPER INNER QUADRANT of the RIGHT breast and possible mass and architectural distortion involving UPPER LEFT breast. EXAM: DIGITAL DIAGNOSTIC BILATERAL MAMMOGRAM WITH TOMO LIMITED ULTRASOUND BILATERAL BREASTS COMPARISON:  Previous exam(s). ACR Breast Density Category c: The breast tissue is heterogeneously dense, which may obscure small masses. FINDINGS: Tomosynthesis and synthesized spot-compression CC and MLO views of the areas of concern in both breasts were obtained. RIGHT: Spot compression images confirm a mass with irregular margins measuring approximately 1 cm in size in the [place] at POSTERIOR depth. Subtle architectural distortion is associated with the mass. Targeted ultrasound is performed, showing a hypoechoic mass with angular margins at the 12 o'clock position approximately 5 cm from the nipple measuring approximately 0.9 x 0.7 x 0.9 cm, demonstrating no posterior characteristics and demonstrating internal power Doppler flow, corresponding to the screening mammographic finding. Sonographic evaluation of the RIGHT axilla demonstrates no pathologic lymphadenopathy. LEFT: Spot  compression images confirm an isodense mass measuring just over 1 cm in size whose margins appear relatively circumscribed in the UPPER breast at MIDDLE depth. Immediately ANTERIOR to this mass is persistent architectural distortion, likely associated an irregular mass. Targeted ultrasound is performed, showing an irregular hypoechoic mass at the 1 o'clock position approximately 2 cm from the nipple which extends up to the skin surface at the level of the nipple-areola complex, measuring approximately 2.5 x 1.9 x 1.8 cm, demonstrating dense acoustic shadowing and demonstrating internal power Doppler flow, accounting for the distortion on mammography. POSTERIOR to the dominant mass is a benign cyst measuring approximately 1.3 x 0.9 x 1.1 cm, demonstrating posterior acoustic enhancement and no internal power Doppler flow, accounting for the mass on mammography. Sonographic evaluation of the LEFT axilla demonstrates no pathologic lymphadenopathy. On correlative physical exam, there is palpable thickening in the UPPER OUTER periareolar LEFT breast with slight retraction of the LEFT nipple. There is no palpable abnormality in the UPPER RIGHT breast. IMPRESSION: 1. Highly suspicious approximate 2.5 cm mass involving the UPPER INNER periareolar LEFT breast at the 1 o'clock position approximately 2 cm with, with likely involvement of the skin of the nipple-areola complex. 2. Suspicious approximate 0.9 cm mass involving the UPPER RIGHT breast at the 12 o'clock position approximately 5 cm nipple. 3. No pathologic lymphadenopathy involving either the RIGHT or LEFT axilla. 4. Benign cyst immediately POSTERIOR to the highly suspicious LEFT breast mass. RECOMMENDATION: Ultrasound-guided core needle biopsy of the highly suspicious LEFT breast mass and the suspicious RIGHT breast mass. The ultrasound core needle biopsy procedure was discussed with the patient and her questions were answered. She wishes to proceed and the biopsy has  been scheduled at her convenience. I have discussed the findings and recommendations with the patient. BI-RADS CATEGORY  5: Highly suggestive of malignancy. Electronically Signed   By: Evangeline Dakin M.D.   On: 03/06/2020 12:35   MM DIAG BREAST TOMO BILATERAL  Result Date: 03/06/2020 CLINICAL DATA:  Recall from screening mammography with tomosynthesis, possible mass involving the UPPER INNER QUADRANT of the RIGHT breast and possible mass and architectural distortion  involving UPPER LEFT breast. EXAM: DIGITAL DIAGNOSTIC BILATERAL MAMMOGRAM WITH TOMO LIMITED ULTRASOUND BILATERAL BREASTS COMPARISON:  Previous exam(s). ACR Breast Density Category c: The breast tissue is heterogeneously dense, which may obscure small masses. FINDINGS: Tomosynthesis and synthesized spot-compression CC and MLO views of the areas of concern in both breasts were obtained. RIGHT: Spot compression images confirm a mass with irregular margins measuring approximately 1 cm in size in the [place] at POSTERIOR depth. Subtle architectural distortion is associated with the mass. Targeted ultrasound is performed, showing a hypoechoic mass with angular margins at the 12 o'clock position approximately 5 cm from the nipple measuring approximately 0.9 x 0.7 x 0.9 cm, demonstrating no posterior characteristics and demonstrating internal power Doppler flow, corresponding to the screening mammographic finding. Sonographic evaluation of the RIGHT axilla demonstrates no pathologic lymphadenopathy. LEFT: Spot compression images confirm an isodense mass measuring just over 1 cm in size whose margins appear relatively circumscribed in the UPPER breast at MIDDLE depth. Immediately ANTERIOR to this mass is persistent architectural distortion, likely associated an irregular mass. Targeted ultrasound is performed, showing an irregular hypoechoic mass at the 1 o'clock position approximately 2 cm from the nipple which extends up to the skin surface  at the level of the nipple-areola complex, measuring approximately 2.5 x 1.9 x 1.8 cm, demonstrating dense acoustic shadowing and demonstrating internal power Doppler flow, accounting for the distortion on mammography. POSTERIOR to the dominant mass is a benign cyst measuring approximately 1.3 x 0.9 x 1.1 cm, demonstrating posterior acoustic enhancement and no internal power Doppler flow, accounting for the mass on mammography. Sonographic evaluation of the LEFT axilla demonstrates no pathologic lymphadenopathy. On correlative physical exam, there is palpable thickening in the UPPER OUTER periareolar LEFT breast with slight retraction of the LEFT nipple. There is no palpable abnormality in the UPPER RIGHT breast. IMPRESSION: 1. Highly suspicious approximate 2.5 cm mass involving the UPPER INNER periareolar LEFT breast at the 1 o'clock position approximately 2 cm with, with likely involvement of the skin of the nipple-areola complex. 2. Suspicious approximate 0.9 cm mass involving the UPPER RIGHT breast at the 12 o'clock position approximately 5 cm nipple. 3. No pathologic lymphadenopathy involving either the RIGHT or LEFT axilla. 4. Benign cyst immediately POSTERIOR to the highly suspicious LEFT breast mass. RECOMMENDATION: Ultrasound-guided core needle biopsy of the highly suspicious LEFT breast mass and the suspicious RIGHT breast mass. The ultrasound core needle biopsy procedure was discussed with the patient and her questions were answered. She wishes to proceed and the biopsy has been scheduled at her convenience. I have discussed the findings and recommendations with the patient. BI-RADS CATEGORY  5: Highly suggestive of malignancy. Electronically Signed   By: Evangeline Dakin M.D.   On: 03/06/2020 12:35   MM CLIP PLACEMENT LEFT  Result Date: 03/15/2020 CLINICAL DATA:  Patient status post ultrasound-guided biopsy left breast mass 1 o'clock position. EXAM: DIAGNOSTIC LEFT MAMMOGRAM POST ULTRASOUND BIOPSY  COMPARISON:  Previous exam(s). FINDINGS: Mammographic images were obtained following ultrasound guided biopsy of left breast mass 1 o'clock position. The biopsy marking clip is in expected position at the site of biopsy. IMPRESSION: Appropriate positioning of the ribbon shaped biopsy marking clip at the site of biopsy in the left breast mass 1 o'clock position. Final Assessment: Post Procedure Mammograms for Marker Placement Electronically Signed   By: Lovey Newcomer M.D.   On: 03/15/2020 08:43   MM CLIP PLACEMENT RIGHT  Result Date: 03/15/2020 CLINICAL DATA:  Patient status post ultrasound-guided  biopsy right breast mass. EXAM: DIAGNOSTIC RIGHT MAMMOGRAM POST ULTRASOUND BIOPSY COMPARISON:  Previous exam(s). FINDINGS: Mammographic images were obtained following ultrasound guided biopsy of right breast mass 12 o'clock position. The biopsy marking clip is in expected position at the site of biopsy. IMPRESSION: Appropriate positioning of the ribbon shaped biopsy marking clip at the site of biopsy in the right breast mass 12 o'clock position. Final Assessment: Post Procedure Mammograms for Marker Placement Electronically Signed   By: Lovey Newcomer M.D.   On: 03/15/2020 08:42   [place] LT BREAST BX W LOC DEV 1ST LESION IMG BX SPEC US GUIDE  Addendum Date: 03/21/2020   ADDENDUM REPORT: 03/16/2020 15:30 ADDENDUM: Pathology revealed GRADE I INVASIVE DUCTAL CARCINOMA. INTERMEDIATE GRADE DUCTAL CARCINOMA IN SITU of the RIGHT breast. This was found to be concordant by Dr. Lovey Newcomer. 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 This was found to be concordant by Dr. Lovey Newcomer. Pathology results were discussed with the patient by telephone. The patient reported doing well after the biopsies with tenderness at the sites. Post biopsy instructions and care were reviewed and questions were answered. The patient was encouraged to call [facility]  for any additional concerns. Surgical consultation has been arranged with Dr. Alphonsa Overall at [facility] on March 23, 2020. Pathology results reported by Stacie Acres RN on 03/16/2020. Electronically Signed   By: Lovey Newcomer M.D.   On: 03/16/2020 15:30   Result Date: 03/21/2020 CLINICAL DATA:  Patient with indeterminate left breast mass 1 o'clock position. EXAM: ULTRASOUND GUIDED LEFT BREAST CORE NEEDLE BIOPSY COMPARISON:  Previous exam(s). PROCEDURE: I met with the patient and we discussed the procedure of ultrasound-guided biopsy, including benefits and alternatives. We discussed the high likelihood of a successful procedure. We discussed the risks of the procedure, including infection, bleeding, tissue injury, clip migration, and inadequate sampling. Informed written consent was given. The usual time-out protocol was performed immediately prior to the procedure. Lesion quadrant: Upper outer quadrant Using sterile technique and 1% Lidocaine as local anesthetic, under direct ultrasound visualization, a 14 gauge spring-loaded device was used to perform biopsy of left breast mass 1 o'clock position using a lateral approach. At the conclusion of the procedure ribbon shaped tissue marker clip was deployed into the biopsy cavity. Follow up 2 view mammogram was performed and dictated separately. IMPRESSION: Ultrasound guided biopsy of left breast mass 1 o'clock position. No apparent complications. Electronically Signed: By: Lovey Newcomer M.D. 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 This was found to be concordant by Dr. Lovey Newcomer. 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 This was found to be concordant by Dr.  Lovey Newcomer. Pathology results were discussed with the patient by telephone. The patient reported doing well after the biopsies with tenderness at the sites. Post biopsy instructions and care were reviewed and questions were answered. The patient was encouraged to call [facility] for any additional concerns. Surgical consultation has been arranged with Dr. Alphonsa Overall at [facility] on March 23, 2020. Pathology results reported by Stacie Acres RN on 03/16/2020. Electronically Signed   By: Polly Cobia.D.  On: 03/16/2020 15:29   Result Date: 03/21/2020 CLINICAL DATA:  Patient status post ultrasound-guided biopsy right breast mass. EXAM: ULTRASOUND GUIDED RIGHT BREAST CORE NEEDLE BIOPSY COMPARISON:  Previous exam(s). PROCEDURE: I met with the patient and we discussed the procedure of ultrasound-guided biopsy, including benefits and alternatives. We discussed the high likelihood of a successful procedure. We discussed the risks of the procedure, including infection, bleeding, tissue injury, clip migration, and inadequate sampling. Informed written consent was given. The usual time-out protocol was performed immediately prior to the procedure. Lesion quadrant: Upper inner quadrant Using sterile technique and 1% Lidocaine as local anesthetic, under direct ultrasound visualization, a 14 gauge spring-loaded device was used to perform biopsy of right breast mass 12 o'clock position using a lateral approach. At the conclusion of the procedure ribbon shaped tissue marker clip was deployed into the biopsy cavity. Follow up 2 view mammogram was performed and dictated separately. IMPRESSION: Ultrasound guided biopsy of right breast mass 12 o'clock position. No apparent complications. Electronically Signed: By: Lovey Newcomer M.D. On: 03/15/2020 08:37       IMPRESSION/PLAN: 1. 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 Dr. Lisbeth Renshaw discusses the pathology findings and reviews the nature of bilateral breast disease. The consensus from the breast conference includes bilateral breast conservation with lumpectomy with sentinel node biopsy, and that the left nipple would be removed. Her MRI results did show more satellite change about the left breast, and we are awaiting Dr. Pollie Friar perspective on whether or not breast conservation is achievable, or whether she would need mastectomy on the left. Dr. Lisbeth Renshaw discusses the recommendation as well for oncotype dx testing to determine if there was a role for systemic therapy. We reviewed the scenario for which adjuvant radiotherapy would be used for breast conserving surgery, and also reviewed some cases when postmastectomy therapy may be indicated. We discussed the risks, benefits, short, and long term effects of radiotherapy, and the patient is interested in proceeding at the appropriate interval pending final surgical decision making. Dr. Lisbeth Renshaw discusses the delivery and logistics of radiotherapy and anticipates a course of 4 or 6 1/2 weeks of radiotherapy to either one or both breasts depending on surgery. We will see her back a few weeks after surgery to discuss the final plan and to coordinate the simulation process and anticipate we starting radiotherapy about 4-6 weeks after surgery.  2. Possible genetic predisposition to malignancy. The patient is a candidate for genetic testing given her personal history. She was offered referral and would like to consider her options but may wish to proceed with this in the future.  This encounter was provided by telemedicine platform MyChart.  The patient has provided two factor identification and has given verbal consent for this type of encounter and has been advised to only accept a meeting of this type in a secure network environment. The time spent during this  encounter was 90 minutes including preparation, discussion, and coordination of the patient's care. The attendants for this meeting include Blenda Nicely, RN, Dr. Lisbeth Renshaw, Hayden Pedro  and Michelene Gardener [facility].  During the encounter,  Blenda Nicely, RN, Dr. Lisbeth Renshaw, and Hayden Pedro were located at [facility] Radiation Oncology Department.  Merrell Rettinger [facility] was located at home.   The above documentation reflects my direct findings during this shared patient visit. Please see the  separate note by Dr. Lisbeth Renshaw on this date for the remainder of the patient's plan of care.    Carola Rhine, PAC

## 2020-04-05 ENCOUNTER — Encounter: Payer: Self-pay | Admitting: Licensed Clinical Social Worker

## 2020-04-05 ENCOUNTER — Encounter (HOSPITAL_COMMUNITY)
Admission: RE | Admit: 2020-04-05 | Discharge: 2020-04-05 | Disposition: A | Discharge status: Home / Self Care | Payer: BC Managed Care – PPO | Source: Ambulatory Visit | Attending: Hematology and Oncology | Admitting: Hematology and Oncology

## 2020-04-05 ENCOUNTER — Telehealth: Payer: Self-pay | Admitting: Nutrition

## 2020-04-05 ENCOUNTER — Inpatient Hospital Stay: Payer: BC Managed Care – PPO | Admitting: Nutrition

## 2020-04-05 DIAGNOSIS — C50112 Malignant neoplasm of central portion of left female breast: Secondary | ICD-10-CM | POA: Insufficient documentation

## 2020-04-05 DIAGNOSIS — C50111 Malignant neoplasm of central portion of right female breast: Secondary | ICD-10-CM | POA: Diagnosis not present

## 2020-04-05 DIAGNOSIS — Z170 Estrogen receptor positive status [ER+]: Secondary | ICD-10-CM | POA: Diagnosis not present

## 2020-04-05 DIAGNOSIS — C50919 Malignant neoplasm of unspecified site of unspecified female breast: Secondary | ICD-10-CM | POA: Diagnosis not present

## 2020-04-05 MED ORDER — TECHNETIUM TC 99M MEDRONATE IV KIT
20.0000 | PACK | Freq: Once | INTRAVENOUS | Status: AC | PRN
  Administered 2020-04-05: 21 via INTRAVENOUS

## 2020-04-05 NOTE — Progress Notes (Signed)
[place] Psychosocial Distress Screening Clinical Social Work  Clinical Social Work was referred by distress screening protocol.  The patient scored a 7 on the Psychosocial Distress Thermometer which indicates moderate distress. Clinical Social Worker contacted patient by phone to assess for distress and other psychosocial needs.   Patient reports that she has had a range of emotions and felt overwhelmed at times because of the diagnosis, information, number of appointments, and trying to balance it all with work. She has received FMLA and disability paperwork that she will fill out.   Support system: husband of 39 years, 2 sons, daughter-in-laws, 2 grandchildren  CSW and patient discussed common feeling and emotions when being diagnosed with cancer, and the importance of support during treatment.  CSW informed patient of the support team and support services at [facility].  CSW provided contact information and encouraged patient to call with any questions or concerns.  ONCBCN DISTRESS SCREENING 04/04/2020  Screening Type Initial Screening  Distress experienced in past week (1-10) 7  Emotional problem type Nervousness/Anxiety;Adjusting to illness  Other Contact via phone    Clinical Social Worker follow up needed: Yes.    If yes, follow up plan: will follow-up by phone in 2-4 weeks to check on coping  [name] [name] E, LCSW

## 2020-04-05 NOTE — Telephone Encounter (Signed)
64 year old female diagnosed with bilateral breast cancer followed by Dr. Lindi Adie and Dr. Lisbeth Renshaw.  Current weight 210 pounds with BMI of 34.96.  Patient requesting healthy diet information.  She reports " sweets are my thing."  She is trying to reduce concentrated sugars.  Provided education on plant-based diet.  Reviewed information about cancer and sugar.  Reviewed hydration recommendations.  Encouraged activity per MD.  Mailed fact sheets.

## 2020-04-06 ENCOUNTER — Encounter: Payer: Self-pay | Admitting: *Deleted

## 2020-04-06 ENCOUNTER — Ambulatory Visit (HOSPITAL_COMMUNITY)
Admission: RE | Admit: 2020-04-06 | Discharge: 2020-04-06 | Disposition: A | Discharge status: Home / Self Care | Payer: BC Managed Care – PPO | Source: Ambulatory Visit | Attending: Hematology and Oncology | Admitting: Hematology and Oncology

## 2020-04-06 ENCOUNTER — Other Ambulatory Visit: Payer: Self-pay

## 2020-04-06 ENCOUNTER — Inpatient Hospital Stay: Payer: BC Managed Care – PPO

## 2020-04-06 DIAGNOSIS — C50111 Malignant neoplasm of central portion of right female breast: Secondary | ICD-10-CM

## 2020-04-06 DIAGNOSIS — I700 Atherosclerosis of aorta: Secondary | ICD-10-CM | POA: Diagnosis not present

## 2020-04-06 DIAGNOSIS — Z170 Estrogen receptor positive status [ER+]: Secondary | ICD-10-CM | POA: Diagnosis not present

## 2020-04-06 DIAGNOSIS — F17210 Nicotine dependence, cigarettes, uncomplicated: Secondary | ICD-10-CM | POA: Diagnosis not present

## 2020-04-06 DIAGNOSIS — C50912 Malignant neoplasm of unspecified site of left female breast: Secondary | ICD-10-CM | POA: Diagnosis not present

## 2020-04-06 DIAGNOSIS — N3289 Other specified disorders of bladder: Secondary | ICD-10-CM | POA: Diagnosis not present

## 2020-04-06 DIAGNOSIS — F419 Anxiety disorder, unspecified: Secondary | ICD-10-CM | POA: Diagnosis not present

## 2020-04-06 DIAGNOSIS — C50911 Malignant neoplasm of unspecified site of right female breast: Secondary | ICD-10-CM | POA: Diagnosis not present

## 2020-04-06 DIAGNOSIS — K219 Gastro-esophageal reflux disease without esophagitis: Secondary | ICD-10-CM | POA: Diagnosis not present

## 2020-04-06 DIAGNOSIS — C50112 Malignant neoplasm of central portion of left female breast: Secondary | ICD-10-CM | POA: Diagnosis not present

## 2020-04-06 DIAGNOSIS — Z8509 Personal history of malignant neoplasm of other digestive organs: Secondary | ICD-10-CM | POA: Diagnosis not present

## 2020-04-06 DIAGNOSIS — E785 Hyperlipidemia, unspecified: Secondary | ICD-10-CM | POA: Diagnosis not present

## 2020-04-06 DIAGNOSIS — N281 Cyst of kidney, acquired: Secondary | ICD-10-CM | POA: Diagnosis not present

## 2020-04-06 DIAGNOSIS — I341 Nonrheumatic mitral (valve) prolapse: Secondary | ICD-10-CM | POA: Diagnosis not present

## 2020-04-06 DIAGNOSIS — J439 Emphysema, unspecified: Secondary | ICD-10-CM | POA: Diagnosis not present

## 2020-04-06 DIAGNOSIS — M810 Age-related osteoporosis without current pathological fracture: Secondary | ICD-10-CM | POA: Diagnosis not present

## 2020-04-06 DIAGNOSIS — K3189 Other diseases of stomach and duodenum: Secondary | ICD-10-CM | POA: Diagnosis not present

## 2020-04-06 LAB — CMP (CANCER CENTER ONLY)
ALT: 13 U/L (ref 0–44)
AST: 12 U/L — ABNORMAL LOW (ref 15–41)
Albumin: 3.8 g/dL (ref 3.5–5.0)
Alkaline Phosphatase: 126 U/L (ref 38–126)
Anion gap: 7 (ref 5–15)
BUN: 7 mg/dL — ABNORMAL LOW (ref 8–23)
CO2: 30 mmol/L (ref 22–32)
Calcium: 9.7 mg/dL (ref 8.9–10.3)
Chloride: 104 mmol/L (ref 98–111)
Creatinine: 0.72 mg/dL (ref 0.44–1.00)
GFR, Est AFR Am: >60 mL/min (ref >60)
GFR, Estimated: >60 mL/min (ref >60)
Glucose, Bld: 114 mg/dL — ABNORMAL HIGH (ref 70–99)
Potassium: 3.8 mmol/L (ref 3.5–5.1)
Sodium: 141 mmol/L (ref 135–145)
Total Bilirubin: 0.6 mg/dL (ref 0.3–1.2)
Total Protein: 7.6 g/dL (ref 6.5–8.1)

## 2020-04-06 MED ORDER — IOHEXOL 300 MG/ML  SOLN
100.0000 mL | Freq: Once | INTRAMUSCULAR | Status: AC | PRN
  Administered 2020-04-06: 100 mL via INTRAVENOUS

## 2020-04-07 ENCOUNTER — Encounter: Payer: Self-pay | Admitting: *Deleted

## 2020-04-13 ENCOUNTER — Other Ambulatory Visit: Payer: Self-pay | Admitting: Surgery

## 2020-04-13 DIAGNOSIS — R9389 Abnormal findings on diagnostic imaging of other specified body structures: Secondary | ICD-10-CM

## 2020-04-14 ENCOUNTER — Encounter: Payer: Self-pay | Admitting: *Deleted

## 2020-04-15 ENCOUNTER — Encounter: Payer: Self-pay | Admitting: Family Medicine

## 2020-04-20 ENCOUNTER — Ambulatory Visit
Admission: RE | Admit: 2020-04-20 | Discharge: 2020-04-20 | Disposition: A | Discharge status: Home / Self Care | Payer: BC Managed Care – PPO | Source: Ambulatory Visit | Attending: Surgery | Admitting: Surgery

## 2020-04-20 ENCOUNTER — Other Ambulatory Visit: Payer: Self-pay

## 2020-04-20 ENCOUNTER — Other Ambulatory Visit (HOSPITAL_COMMUNITY): Payer: Self-pay | Admitting: Diagnostic Radiology

## 2020-04-20 DIAGNOSIS — C50212 Malignant neoplasm of upper-inner quadrant of left female breast: Secondary | ICD-10-CM | POA: Diagnosis not present

## 2020-04-20 DIAGNOSIS — C50412 Malignant neoplasm of upper-outer quadrant of left female breast: Secondary | ICD-10-CM | POA: Diagnosis not present

## 2020-04-20 DIAGNOSIS — R9389 Abnormal findings on diagnostic imaging of other specified body structures: Secondary | ICD-10-CM

## 2020-04-20 DIAGNOSIS — R928 Other abnormal and inconclusive findings on diagnostic imaging of breast: Secondary | ICD-10-CM | POA: Diagnosis not present

## 2020-04-20 MED ORDER — GADOBUTROL 1 MMOL/ML IV SOLN
10.0000 mL | Freq: Once | INTRAVENOUS | Status: AC | PRN
  Administered 2020-04-20: 10 mL via INTRAVENOUS

## 2020-04-21 ENCOUNTER — Encounter: Payer: Self-pay | Admitting: *Deleted

## 2020-04-24 ENCOUNTER — Other Ambulatory Visit: Payer: Self-pay | Admitting: Surgery

## 2020-04-24 DIAGNOSIS — C50911 Malignant neoplasm of unspecified site of right female breast: Secondary | ICD-10-CM

## 2020-04-24 DIAGNOSIS — Z170 Estrogen receptor positive status [ER+]: Secondary | ICD-10-CM

## 2020-04-25 ENCOUNTER — Encounter: Payer: Self-pay | Admitting: Licensed Clinical Social Worker

## 2020-04-25 NOTE — Progress Notes (Signed)
[place] CSW Progress Note  Clinical Education officer, museum contacted patient by phone to offer ongoing support around adjustment and coping. 75 of conversation spent in processing change in surgical plan and patient weighing options and thinking through what single vs double mastectomy would mean for her. Patient is solution-oriented and task driven, so is prepared with practical needs pre- and post-surgery, and is now working on psychological adjustment. CSW offered empathic listening and normalized feelings. Discussed grieving process with surgery as well as cancer diagnosis and facing mortality in general.   Discussed potential supports such as an alight guide to supplement her family and friend support.  CSW will continue to follow-up with patient every 2-3 weeks to offer ongoing coping support. Patient will reach out sooner as needed.     Edwinna Areola Ladeja Pelham , LCSW

## 2020-04-26 ENCOUNTER — Encounter: Payer: Self-pay | Admitting: Hematology and Oncology

## 2020-04-27 ENCOUNTER — Other Ambulatory Visit: Payer: Self-pay | Admitting: Surgery

## 2020-04-27 DIAGNOSIS — C50911 Malignant neoplasm of unspecified site of right female breast: Secondary | ICD-10-CM

## 2020-04-27 DIAGNOSIS — Z170 Estrogen receptor positive status [ER+]: Secondary | ICD-10-CM

## 2020-04-28 ENCOUNTER — Telehealth: Payer: Self-pay | Admitting: Hematology and Oncology

## 2020-04-28 NOTE — Telephone Encounter (Signed)
I called the patient and left a couple of messages for her to call us back to answer the question regarding lumpectomy versus mastectomy.

## 2020-05-01 ENCOUNTER — Other Ambulatory Visit (HOSPITAL_COMMUNITY): Payer: BC Managed Care – PPO

## 2020-05-01 ENCOUNTER — Encounter: Payer: Self-pay | Admitting: *Deleted

## 2020-05-02 ENCOUNTER — Telehealth: Payer: Self-pay | Admitting: Hematology and Oncology

## 2020-05-02 NOTE — Telephone Encounter (Signed)
Called pt per 10/18 sch message - unable to reach pt. Left message for patient with appt date and time

## 2020-05-04 ENCOUNTER — Encounter (HOSPITAL_COMMUNITY): Payer: BC Managed Care – PPO

## 2020-05-05 ENCOUNTER — Encounter (HOSPITAL_COMMUNITY): Payer: Self-pay

## 2020-05-05 ENCOUNTER — Other Ambulatory Visit (HOSPITAL_COMMUNITY)
Admission: RE | Admit: 2020-05-05 | Discharge: 2020-05-05 | Disposition: A | Discharge status: Home / Self Care | Payer: BC Managed Care – PPO | Source: Ambulatory Visit | Attending: Surgery | Admitting: Surgery

## 2020-05-05 ENCOUNTER — Other Ambulatory Visit: Payer: Self-pay

## 2020-05-05 ENCOUNTER — Encounter (HOSPITAL_COMMUNITY)
Admission: RE | Admit: 2020-05-05 | Discharge: 2020-05-05 | Disposition: A | Discharge status: Home / Self Care | Payer: BC Managed Care – PPO | Source: Ambulatory Visit | Attending: Surgery | Admitting: Surgery

## 2020-05-05 DIAGNOSIS — C50912 Malignant neoplasm of unspecified site of left female breast: Secondary | ICD-10-CM | POA: Insufficient documentation

## 2020-05-05 DIAGNOSIS — Z01818 Encounter for other preprocedural examination: Secondary | ICD-10-CM | POA: Insufficient documentation

## 2020-05-05 DIAGNOSIS — Z01812 Encounter for preprocedural laboratory examination: Secondary | ICD-10-CM | POA: Insufficient documentation

## 2020-05-05 DIAGNOSIS — Z79899 Other long term (current) drug therapy: Secondary | ICD-10-CM | POA: Insufficient documentation

## 2020-05-05 DIAGNOSIS — I10 Essential (primary) hypertension: Secondary | ICD-10-CM | POA: Insufficient documentation

## 2020-05-05 DIAGNOSIS — C50911 Malignant neoplasm of unspecified site of right female breast: Secondary | ICD-10-CM | POA: Insufficient documentation

## 2020-05-05 DIAGNOSIS — Z20822 Contact with and (suspected) exposure to covid-19: Secondary | ICD-10-CM | POA: Insufficient documentation

## 2020-05-05 HISTORY — DX: Essential (primary) hypertension: I10

## 2020-05-05 HISTORY — DX: Unspecified osteoarthritis, unspecified site: M19.90

## 2020-05-05 LAB — BASIC METABOLIC PANEL
Anion gap: 10 (ref 5–15)
BUN: 7 mg/dL — ABNORMAL LOW (ref 8–23)
CO2: 27 mmol/L (ref 22–32)
Calcium: 9.5 mg/dL (ref 8.9–10.3)
Chloride: 104 mmol/L (ref 98–111)
Creatinine, Ser: 0.59 mg/dL (ref 0.44–1.00)
GFR, Estimated: >60 mL/min (ref >60)
Glucose, Bld: 112 mg/dL — ABNORMAL HIGH (ref 70–99)
Potassium: 3.7 mmol/L (ref 3.5–5.1)
Sodium: 141 mmol/L (ref 135–145)

## 2020-05-05 LAB — CBC
HCT: 46.9 % — ABNORMAL HIGH (ref 36.0–46.0)
Hemoglobin: 15.5 g/dL — ABNORMAL HIGH (ref 12.0–15.0)
MCH: 30.2 pg (ref 26.0–34.0)
MCHC: 33 g/dL (ref 30.0–36.0)
MCV: 91.2 fL (ref 80.0–100.0)
Platelets: 197 10*3/uL (ref 150–400)
RBC: 5.14 MIL/uL — ABNORMAL HIGH (ref 3.87–5.11)
RDW: 13.1 % (ref 11.5–15.5)
WBC: 9.3 10*3/uL (ref 4.0–10.5)
nRBC: 0 % (ref 0.0–0.2)

## 2020-05-05 LAB — SARS CORONAVIRUS 2 (TAT 6-24 HRS): SARS Coronavirus 2: NEGATIVE

## 2020-05-05 NOTE — Progress Notes (Signed)
Your procedure is scheduled on Tuesday, October 26.  Report to [facility], Main Entrance or Entrance "A" at 11:00 AM                 Your surgery or procedure is scheduled to begin at 1:00 PM   Call this number if you have problems the morning of surgery: [phone]  This is the number for the Pre- Surgical Desk.                For any other questions, please call [phone], Monday - Friday 8 AM - 4 PM.   Remember:  Do not eat or drink after midnight.  You may drink clear liquids until 10:00.  Clear liquids allowed are:     Water, Juice (non-citric and without pulp - diabetics please choose diet or no sugar options), Carbonated beverages - (diabetics please choose diet or no sugar options), Clear Tea, Black Coffee only (no creamer, milk or cream including half and half), Plain Jell-O only (diabetics please choose diet or no sugar options), Gatorade (diabetics please choose diet or no sugar options) and Plain Popsicles only  Drink the Pre-Surgery Ensure you were given at PAT appointment  Between 9:00 and 10:00 AM- then nothing else to drink after 10:00 AM.   Take these medicines the morning of surgery with A SIP OF WATER: Amlodipine (Norvasc) Atorvastatin ( Liptior) Esomeprazole ( Nexium)  If Needed: Albuterol ( Ventolin) inhaler- bring it to the hospital with you Azelastine (Astelin) nasal spray   STOP taking Aspirin, Aspirin Products (Goody Powder, Excedrin Migraine), Ibuprofen (Advil), Naproxen (Aleve), Vitamins and Herbal Products (ie Fish Oil).    Special instructions:  No Smoking within 24 hours of surgery.  [place]- Preparing For Surgery  Before surgery, you can play an important role. Because skin is not sterile, your skin needs to be as free of germs as possible. You can reduce the number of germs on your skin by washing with CHG (chlorahexidine gluconate) Soap before surgery.  CHG is an antiseptic cleaner which kills germs and bonds with the skin to continue  killing germs even after washing.    Oral Hygiene is also important to reduce your risk of infection.  Remember - BRUSH YOUR TEETH THE MORNING OF SURGERY WITH YOUR REGULAR TOOTHPASTE  Please do not use if you have an allergy to CHG or antibacterial soaps. If your skin becomes reddened/irritated stop using the CHG.  Do not shave (including legs and underarms) for at least 48 hours prior to first CHG shower. It is OK to shave your face.  Please follow these instructions carefully.   1. Shower the NIGHT BEFORE SURGERY and the MORNING OF SURGERY with CHG.   2. If you chose to wash your hair, wash your hair first as usual with your normal shampoo.  3. After you shampoo, wash your face and private area with the soap you use at home, then rinse your hair and body thoroughly to remove the shampoo and soap.  4. Use CHG as you would any other liquid soap. You can apply CHG directly to the skin and wash gently with a scrungie or a clean washcloth.   5. Apply the CHG Soap to your body ONLY FROM THE NECK DOWN.  Do not use on open wounds or open sores. Avoid contact with your eyes, ears, mouth and genitals (private parts).   6. Wash thoroughly, paying special attention to the area where your surgery will be performed.  7. Thoroughly rinse your body with warm water from the neck down.  8. DO NOT shower/wash with your normal soap after using and rinsing off the CHG Soap.  9. Pat yourself dry with a CLEAN TOWEL.  10. Wear CLEAN PAJAMAS to bed the night before surgery, wear comfortable clothes the morning of surgery  11. Place CLEAN SHEETS on your bed the night of your first shower and DO NOT SLEEP WITH PETS.  Day of Surgery: Shower as instructed above. Do not apply any deodorants/lotions, powders or colognes.  Please wear clean clothes to the hospital/surgery center.   Remember to brush your teeth WITH YOUR REGULAR TOOTHPASTE.  Do not wear jewelry, make-up or nail polish.  Do not shave 48 hours  prior to surgery.  Men may shave face and neck.  Do not bring valuables to the hospital.  [facility] is not responsible for any belongings or valuables.  Contacts, dentures or bridgework may not be worn into surgery.  Leave your suitcase in the car.  After surgery it may be brought to your room.  For patients admitted to the hospital, discharge time will be determined by your treatment team.  Patients discharged the day of surgery will not be allowed to drive home.   Please read over the fact sheets that you were given.

## 2020-05-05 NOTE — Progress Notes (Signed)
PCP - Dr. Rushie Chestnut  Cardiologist - none  Chest x-ray - na  EKG - 05/04/20  Stress Test - no  ECHO - no Cardiac Cath - no  Sleep Study - yes ago CPAP - no  LABS- CBC, BMP  ASA-na  ERAS- yes  HA1C-na Fasting Blood Sugar - na Checks Blood Sugar ___na__ times a day  Anesthesia-  Pt denies having chest pain, sob, or fever at this time. All instructions explained to the pt, with a verbal understanding of the material. Pt agrees to go over the instructions while at home for a better understanding. Pt also instructed to self quarantine after being tested for COVID-19. The opportunity to ask questions was provided.

## 2020-05-05 NOTE — Progress Notes (Signed)
Your procedure is scheduled on Tuesday, October 26.  Report to [facility], Main Entrance or Entrance "A" at 11:00 AM                 Your surgery or procedure is scheduled to begin at 1:00 PM   Call this number if you have problems the morning of surgery: [phone]  This is the number for the Pre- Surgical Desk.                For any other questions, please call [phone], Monday - Friday 8 AM - 4 PM.   Remember:  Do not eat or drink after midnight.  You may drink clear liquids until 10:00.  Clear liquids allowed are:     Water, Juice (non-citric and without pulp - diabetics please choose diet or no sugar options), Carbonated beverages - (diabetics please choose diet or no sugar options), Clear Tea, Black Coffee only (no creamer, milk or cream including half and half), Plain Jell-O only (diabetics please choose diet or no sugar options), Gatorade (diabetics please choose diet or no sugar options) and Plain Popsicles only    Take these medicines the morning of surgery with A SIP OF WATER:    STOP taking Aspirin, Aspirin Products (Goody Powder, Excedrin Migraine), Ibuprofen (Advil), Naproxen (Aleve), Vitamins and Herbal Products (ie Fish Oil).    Special instructions:  No Smoking within 24 hours of surgery.  - Preparing For Surgery  Before surgery, you can play an important role. Because skin is not sterile, your skin needs to be as free of germs as possible. You can reduce the number of germs on your skin by washing with CHG (chlorahexidine gluconate) Soap before surgery.  CHG is an antiseptic cleaner which kills germs and bonds with the skin to continue killing germs even after washing.    Oral Hygiene is also important to reduce your risk of infection.  Remember - BRUSH YOUR TEETH THE MORNING OF SURGERY WITH YOUR REGULAR TOOTHPASTE  Please do not use if you have an allergy to CHG or antibacterial soaps. If your skin becomes reddened/irritated stop using the  CHG.  Do not shave (including legs and underarms) for at least 48 hours prior to first CHG shower. It is OK to shave your face.  Please follow these instructions carefully.   1. Shower the NIGHT BEFORE SURGERY and the MORNING OF SURGERY with CHG.   2. If you chose to wash your hair, wash your hair first as usual with your normal shampoo.  3. After you shampoo, wash your face and private area with the soap you use at home, then rinse your hair and body thoroughly to remove the shampoo and soap.  4. Use CHG as you would any other liquid soap. You can apply CHG directly to the skin and wash gently with a scrungie or a clean washcloth.   5. Apply the CHG Soap to your body ONLY FROM THE NECK DOWN.  Do not use on open wounds or open sores. Avoid contact with your eyes, ears, mouth and genitals (private parts).   6. Wash thoroughly, paying special attention to the area where your surgery will be performed.  7. Thoroughly rinse your body with warm water from the neck down.  8. DO NOT shower/wash with your normal soap after using and rinsing off the CHG Soap.  9. Pat yourself dry with a CLEAN TOWEL.  10. Wear CLEAN PAJAMAS to bed the night before surgery,  wear comfortable clothes the morning of surgery  11. Place CLEAN SHEETS on your bed the night of your first shower and DO NOT SLEEP WITH PETS.  Day of Surgery: Shower as instructed above. Do not apply any deodorants/lotions, powders or colognes.  Please wear clean clothes to the hospital/surgery center.   Remember to brush your teeth WITH YOUR REGULAR TOOTHPASTE.  Do not wear jewelry, make-up or nail polish.  Do not shave 48 hours prior to surgery.  Men may shave face and neck.  Do not bring valuables to the hospital.  [facility] is not responsible for any belongings or valuables.  Contacts, dentures or bridgework may not be worn into surgery.  Leave your suitcase in the car.  After surgery it may be brought to your room.  For  patients admitted to the hospital, discharge time will be determined by your treatment team.  Patients discharged the day of surgery will not be allowed to drive home.   Please read over the fact sheets that you were given.

## 2020-05-07 NOTE — H&P (Signed)
[name] [name]  Location: [facility] Patient #: [id number] DOB: October 13, 1955 Married / Language: English / Race: White Female  History of Present Illness  The patient is a 64 year old female who presents with a complaint of breast cancer. The PCP is Dr. [name] Gyn - Nori Riis Her husband, [name] [name], is with her.  She has her DIL, [name] [name], on the phone.       She had not had a mammogram since around 2016.  She is thinking about retiring and wanted to "get things done".  She has no family history of breast cancer.  She is not on hormone replacement.  She said that she had a fibroadenoma in the left breast a long time ago.       Mammograms: 03/06/2020 - 1. Highly suspicious approximate 2.5 cm mass involving the UPPER INNER periareolar LEFT breast at the 1 o'clock position approximately 2 cm with, with likely involvement of the skin of the nipple-areola complex.                 2. Suspicious approximate 0.9 cm mass involving the UPPER RIGHT breast at the 12 o'clock position approximately 5 cm nipple.                 3. No pathologic lymphadenopathy involving either the RIGHT or LEFT axilla.                 4. Benign cyst immediately POSTERIOR to the highly suspicious LEFT breast mass.             Biopsy:  Bilateral breast biopsies - 03/15/2020 ([id number]) - 1.  RIGHT breast - IDC, ER - 100%, PR - 100%, Ki67 -2%, Her2Neu - neg            2.  LEFT breast - Invasive lobular carcinoma,  ER - 80%, PR - 50%, Ki67 - 2%      Family history of breast or ovarian cancer:  No.  Her mother had appendiceal cancer.      On hormone therapy:  No        I discussed the options for breast cancer treatment with the patient.  We discussed the breast cancer is managed by a multidisciplinary team, which includes medical oncology and radiation oncology.  I discussed the surgical options of lumpectomy vs. mastectomy.  If mastectomy, there is the possibility of reconstruction.   I discussed the options of lymph  node biopsy.  The treatment plan depends on the pathologic staging of the tumor and the patient's personal wishes.      The risks of surgery include, but are not limited to, bleeding, infection, the need for further surgery, and nerve injury.      The patient has been given literature on the treatment of breast cancer.  Plan:  1.  Breast MRI, 2.  Medical and radiation oncology consultation, 3.  PT consult, 4. Possible bilateral lumpectomies with bilateral sentinel lymph node biopsies (she will probably need her left nipple removed)  CT chest/abdomen - 04/06/2020 - IMPRESSION: Basically negative except:  2. Small pulmonary nodules in the LEFT upper lobe (image 60 of series 4) 4 mm with an adjacent 3 mm pulmonary nodule. Findings are nonspecific. Attention on follow-up.  Past Medical History: 1.  Bilateral breast cancer ([facility])      Bilateral breast biopsies - 03/15/2020 ([id number]) - 1.  RIGHT breast - IDC, ER - 100%, PR - 100%, Ki67 -2%, Her2Neu - neg  2.  LEFT breast - Invasive lobular carcinoma,  ER - 80%, PR - 50%, Ki67 - 2% 2.  ? had covid in 04/2019 3.  HTN 4.  GERD 5.  Appendectomy - 2006 - [place] 6.  Bilateral vocal cord polyps - 7/86/7672 - [place] 7.  She got the Covid vaccine in June 2021  Social History: Her husband, [name] [name], is with her.  She has her DIL, [name] [name], on the phone.    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  Past Surgical History [facility] Teressa Senter, [place]; 03/23/2020 3:59 PM) Appendectomy   Breast Biopsy   Bilateral.  Diagnostic Studies History (Chanel Teressa Senter, CMA; 03/23/2020 3:59 PM) Colonoscopy   1-5 years ago Mammogram   within last year Pap Smear   1-5 years ago  Allergies (Chanel Teressa Senter, CMA; 03/23/2020 4:00 PM) Penicillins   Allergies Reconciled     Medication History (Chanel Teressa Senter, CMA; 03/23/2020 4:01 PM) Albuterol Sulfate HFA  (108 (90 Base)MCG/ACT Aerosol Soln, Inhalation) Active. Atorvastatin Calcium  (40MG Tablet, Oral) Active. Azelastine HCl  (0.1% Solution, Nasal) Active. NexIUM  (40MG Capsule DR, Oral) Active. amLODIPine Besylate  (Oral) Specific strength unknown - Active. Citalopram Hydrobromide  (20MG Tablet, Oral) Active. Medications Reconciled   Social History Antonietta Jewel, CMA; 03/23/2020 3:59 PM) Alcohol use   Occasional alcohol use. No caffeine use   No drug use   Tobacco use   Current every day smoker.  Family History ([place], [place]; 03/23/2020 3:59 PM) Alcohol Abuse   Father. Arthritis   Mother. Colon Polyps   Brother, Mother, Sister. Hypertension   Brother, Father, Mother, Sister.  Pregnancy / Birth History Antonietta Jewel, [place]; 03/23/2020 3:59 PM) Age of menopause   57-55 Gravida   2 Maternal age   52-25 Para   2  Other Problems (Chanel Teressa Senter, [place]; 03/23/2020 3:59 PM) Anxiety Disorder   Breast Cancer   Gastroesophageal Reflux Disease   High blood pressure   Hypercholesterolemia     Review of Systems (Chanel Nolan CMA; 03/23/2020 3:59 PM) General Not Present- Appetite Loss, Chills, Fatigue, Fever, Night Sweats, Weight Gain and Weight Loss. Skin Not Present- Change in Wart/Mole, Dryness, Hives, Jaundice, New Lesions, Non-Healing Wounds, Rash and Ulcer. HEENT Present- Seasonal Allergies and Wears glasses/contact lenses. Not Present- Earache, Hearing Loss, Hoarseness, Nose Bleed, Oral Ulcers, Ringing in the Ears, Sinus Pain, Sore Throat, Visual Disturbances and Yellow Eyes. Respiratory Not Present- Bloody sputum, Chronic Cough, Difficulty Breathing, Snoring and Wheezing. Cardiovascular Not Present- Chest Pain, Difficulty Breathing Lying Down, Leg Cramps, Palpitations, Rapid Heart Rate, Shortness of Breath and Swelling of Extremities. Gastrointestinal Not Present- Abdominal Pain, Bloating, Bloody Stool, Change  in Bowel Habits, Chronic diarrhea, Constipation, Difficulty Swallowing, Excessive gas, Gets full quickly at meals, Hemorrhoids, Indigestion, Nausea, Rectal Pain and Vomiting. Female Genitourinary Not Present- Frequency, Nocturia, Painful Urination, Pelvic Pain and Urgency. Musculoskeletal Not Present- Back Pain, Joint Pain, Joint Stiffness, Muscle Pain, Muscle Weakness and Swelling of Extremities. Neurological Not Present- Decreased Memory, Fainting, Headaches, Numbness, Seizures, Tingling, Tremor, Trouble walking and Weakness. Endocrine Not Present- Cold Intolerance, Excessive Hunger, Hair Changes, Heat Intolerance, Hot flashes and New Diabetes. Hematology Not Present- Blood Thinners, Easy Bruising, Excessive bleeding, Gland problems, HIV and Persistent Infections.  Vitals (Chanel Nolan CMA; 03/23/2020 4:01 PM) 03/23/2020 4:01 PM Weight:  210.5 lb   Height: 65 in  Body Surface Area: 2.02 m   Body Mass Index: 35.03 kg/m   Temp.: 97.1 F    Pulse: 93 (Regular)    BP: 122/78(Sitting, Left Arm, Standard)   Physical Exam  General: WN WF who is  alert and generally healthy appearing. HEENT: Normal. Pupils equal.  Neck: Supple. No mass. No thyroid mass. Lymph Nodes:  No supraclavicular, cervical or axillary nodes.  Lungs: Clear to auscultation and symmetric breath sounds. Heart: RRR. No murmur or rub.'  Breasts:  Right - she has a bruise at 12 o'clock.  There is a 2 cm palpable mass at 12 o'clock, this is probably hematoma from the biopsy       Left - She has skin dimpling in the upper outer left areola with a mass effect at 2 o'clock, just off the nipple  Abdomen: Soft. No mass. No tenderness. No hernia. Normal bowel sounds. No abdominal scars. Rectal: Not done.  Extremities: Good strength and ROM in upper and lower extremities.  Neurologic: Grossly intact to motor and sensory function. Psychiatric: Has normal mood and affect. Behavior is normal.   Assessment & Plan  1.  MALIGNANT  NEOPLASM OF RIGHT BREAST, STAGE 1, ESTROGEN RECEPTOR POSITIVE (C50.911) Story: Bilateral breast biopsies - 03/15/2020 ([id number]) - 1. RIGHT breast - IDC, ER - 100%, PR - 100%, Ki67 -2%, Her2Neu - neg  2.  MALIGNANT NEOPLASM OF LEFT BREAST, STAGE 1, ESTROGEN RECEPTOR POSITIVE (C50.912) Story: Bilateral breast biopsies - 03/15/2020 ([id number]) - 2. LEFT breast - Invasive lobular carcinoma, ER - 80%, PR - 50%, Ki67 - 2% Plan: 1. Breast MRI, Breast MRI - 04/02/2020 - IMPRESSION: 1. The known malignancy in the right breast measures 9 x 9 x 10 mm.  No other definitive malignancy seen on the right. 2. The known malignancy on the left appears to measure at least 3.8 x 2.5 x 2.5 cm and extends to the nipple. There may be mild skin thickening at the level of the nipple. The enhancement associated with the known malignancy extends at least to the under surface of the nipple. Skin involvement is not excluded. It is difficult to confidently measure the known malignancy on the left due to the marked surrounding background enhancement. There is suspicion for multiple surrounding satellite lesions which would increase the total dimension to at least 5.6 x 5.0 cm. 3. No lymphadenopathy identified. RECOMMENDATION: Recommend continued surgical and oncologic follow up. If breast conservation is considered on the left, the patient may benefit from additional biopsies to better delineate extent of disease.  Addendum Note Fenton Malling. [name] MD; 04/10/2020 2:32 PM) Reviewed with Dr. Alejandro Mulling. He suggested 2 additional biopsies on the left.  It looks like the area of concern involves her central breast behind and just above her left nipple. I drew a diagram to put in Epic.  Addendum Note Fenton Malling. Deserie Dirks MD; 04/24/2020 12:33 PM) Both of her additional left breast biopsies were positive.  I reviewed her films with Dr. Gwenevere Ghazi.  It looks like the area on the left breast involves 5.6 cm x 4.9 cm x 3.0 cm.  To get this out  would remove a large portion of her breast. ----------- I called the patient and reviewed all this with the patient. She had already pretty much decided on a left mastectomy.  She smokes and is not interested in reconstruction at this time. She decided on a left mastectomy and a right lumpectomy.  She  will need sentinel lymph node biopsies on both sides.  2. Medical and radiation oncology consultation, She's seen Drs. Remus Blake for oncology 3. PT consult,  3.  SMOKES (F17.200) 4.  Allergy Her penicillin allergy is a rash about 10 years ago. 5.  HTN 6.  GERD 7.  Bilateral vocal cord polyps - 4/86/2824 - Stevphen Rochester, MD, [facility] Surgery Office phone:  [phone]

## 2020-05-08 ENCOUNTER — Ambulatory Visit
Admission: RE | Admit: 2020-05-08 | Discharge: 2020-05-08 | Disposition: A | Discharge status: Home / Self Care | Payer: BC Managed Care – PPO | Source: Ambulatory Visit | Attending: Surgery | Admitting: Surgery

## 2020-05-08 ENCOUNTER — Other Ambulatory Visit: Payer: Self-pay

## 2020-05-08 ENCOUNTER — Encounter: Payer: Self-pay | Admitting: *Deleted

## 2020-05-08 DIAGNOSIS — C50911 Malignant neoplasm of unspecified site of right female breast: Secondary | ICD-10-CM

## 2020-05-08 DIAGNOSIS — Z170 Estrogen receptor positive status [ER+]: Secondary | ICD-10-CM

## 2020-05-08 DIAGNOSIS — R928 Other abnormal and inconclusive findings on diagnostic imaging of breast: Secondary | ICD-10-CM | POA: Diagnosis not present

## 2020-05-08 NOTE — Anesthesia Preprocedure Evaluation (Addendum)
Anesthesia Evaluation  Patient identified by MRN, date of birth, ID band Patient awake    Reviewed: Allergy & Precautions, NPO status , Patient's Chart, lab work & pertinent test results  Airway Mallampati: II  TM Distance: >3 FB Neck ROM: Full    Dental no notable dental hx.    Pulmonary sleep apnea , Current Smoker and Patient abstained from smoking.,    Pulmonary exam normal breath sounds clear to auscultation       Cardiovascular hypertension, Pt. on medications negative cardio ROS Normal cardiovascular exam Rhythm:Regular Rate:Normal     Neuro/Psych Anxiety negative neurological ROS  negative psych ROS   GI/Hepatic Neg liver ROS, GERD  ,  Endo/Other  negative endocrine ROS  Renal/GU negative Renal ROS  negative genitourinary   Musculoskeletal  (+) Arthritis ,   Abdominal (+) + obese,   Peds negative pediatric ROS (+)  Hematology negative hematology ROS (+)   Anesthesia Other Findings   Reproductive/Obstetrics negative OB ROS                            Anesthesia Physical Anesthesia Plan  ASA: III  Anesthesia Plan: General   Post-op Pain Management:  Regional for Post-op pain   Induction: Intravenous  PONV Risk Score and Plan: 2 and Ondansetron, Midazolam and Treatment may vary due to age or medical condition  Airway Management Planned: Oral ETT  Additional Equipment:   Intra-op Plan:   Post-operative Plan: Extubation in OR  Informed Consent: I have reviewed the patients History and Physical, chart, labs and discussed the procedure including the risks, benefits and alternatives for the proposed anesthesia with the patient or authorized representative who has indicated his/her understanding and acceptance.     Dental advisory given  Plan Discussed with: CRNA  Anesthesia Plan Comments: (PAT note written 05/08/2020 by Myra Gianotti, PA-C. )       Anesthesia  Quick Evaluation

## 2020-05-08 NOTE — Progress Notes (Signed)
Anesthesia Chart Review:  Case: [id number] Date/Time: 05/09/20 1245   Procedures:      RIGHT BREAST LUMPECTOMY WITH RIGHT AXILLARY SENTINEL LYMPH NODE BIOPSY, LEFT AXILLARY SENTINEL LYMPH NODE BIOPSY (Bilateral )     LEFT MASECTOMY (Left )   Anesthesia type: General   Pre-op diagnosis: BILATERAL BREAST CANCER   Location: [facility] OR ROOM 09 / [facility]   Surgeons: Alphonsa Overall, MD      DISCUSSION: Patient is a 64 year old female scheduled for the above procedure.   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 Abnormal glucose is listed without diagnosis of DM (A1 was 5.9% on 01/13/15, placing her at increased risk for developing DM--last A1c noted was from 05/02/16 and was normal at 5.6%. BMI is consistent with obesity.  Preoperative labs and EKG acceptable for OR. Cr 0.59, glucose 112, H/H 15.5/46.9. EKG showed SB at 59 bpm. She had preoperative evaluation with Dr. Yong Channel on 03/24/20. No murmur documented on exam. He strongly encouraged smoking cessation.  05/05/20 presurgical COVID-19 test negative. Appears RSL is scheduled for 05/08/20. Anesthesia team to evaluate on the day of surgery.    VS: BP (!) 151/73   Pulse 66   Temp 37.1 C (Oral)   Resp 18   Ht 5\' 5"  (1.651 m)   Wt 92.8 kg   SpO2 95%   BMI 34.05 kg/m  Postmenopausal.    PROVIDERS: [name], MD is PCP. Last visit 03/24/20. He advised smoking cessation and though she likely had mild COPD, but did not require albuterol regularly and did not feel that she would need further pulmonary work-up prior to surgery but would consider PFTs in the future. In regards to preoperative evaluation, he wrote: "In regards to presurgical evaluation- No chest pain with flight or stairs or steep incline. Does have some mild SOB related to long term smoking.  States can do flight of stairs more easily with albuterol- rarely uses a puff (before sleep can help her with cough in particular). Still smoking 1.5 PPD strongly encouraged cessation today which would lower her CV risk with surgery". Nicholas Lose, MD is HEM-ONC Kyung Rudd, MD is RAD-ONC   LABS: Labs reviewed: Acceptable for surgery. (all labs ordered are listed, but only abnormal results are displayed)  Labs Reviewed  BASIC METABOLIC PANEL - Abnormal; Notable for the following components:      Result Value   Glucose, Bld 112 (*)    BUN 7 (*)    All other components within normal limits  CBC - Abnormal; Notable for the following components:   RBC 5.14 (*)    Hemoglobin 15.5 (*)    HCT 46.9 (*)    All other components within normal limits     OTHER: Sleep Study 02/16/13: IMPRESSIONS: 1. 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 2. Oxygen desaturation without events is suggestive of underlying     pulmonary disease such as COPD. 3. Few PLMs with arousals were noted.  The significance of this is     unknown.  Please correlate with a clinical history of restless legs     syndrome. 4. No evidence of cardiac arrhythmias, or behavioral disturbance     during sleep. RECOMMENDATION: 1. Treatment option for this degree of sleep-disordered breathing  include weight loss.  CPAP therapy is probably not indicated.     Oxygen, she does not meet criteria for oxygen therapy.  Smoking     cessation should be emphasized. 2. She should be asked to avoid medications with sedative side     effects.  She should be cautioned against driving when sleepy.   EKG: 05/05/20: Sinus bradycardia at 59 bpm  Otherwise normal ECG No significant change since last tracing Confirmed by Daneen Schick [phone]) on 05/05/2020 12:53:05 PM   CV: N/A   Past Medical History:  Diagnosis Date  . Abnormal glucose   . Anxiety     05/05/20  . Arthritis   . Breast cancer ([place]) 03/2020  . Esophageal stricture    Dr.Dora Olevia Perches  . GERD (gastroesophageal reflux disease)   . History of colonic polyps   . Hyperlipidemia   . Hypertension   . MVP (mitral valve prolapse)    history of   . Osteoporosis   . Tobacco abuse    tried Chantix- caused agression    Past Surgical History:  Procedure Laterality Date  . APPENDECTOMY  2008  . BREAST BIOPSY  2000  . BREAST BIOPSY Bilateral 2021  . COLONOSCOPY    . ESOPHAGOGASTRODUODENOSCOPY    . 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 vocal cord polyps removed     ben[facility], Dr Erik Obey    MEDICATIONS: . acetaminophen (TYLENOL) 500 MG tablet  . albuterol (VENTOLIN HFA) 108 (90 Base) MCG/ACT inhaler  . amLODipine (NORVASC) 2.5 MG tablet  . atorvastatin (LIPITOR) 40 MG tablet  . azelastine (ASTELIN) 0.1 % nasal spray  . citalopram (CELEXA) 20 MG tablet  . esomeprazole (NEXIUM) 40 MG capsule   No current facility-administered medications for this encounter.    Myra Gianotti, PA-C Surgical Short Stay/Anesthesiology [facility] Phone [phone] [facility] Phone [phone] 05/08/2020 11:43 AM

## 2020-05-09 ENCOUNTER — Observation Stay (HOSPITAL_COMMUNITY)
Admission: RE | Admit: 2020-05-09 | Discharge: 2020-05-10 | Disposition: A | Discharge status: Home / Self Care | Payer: BC Managed Care – PPO | Attending: Surgery | Admitting: Surgery

## 2020-05-09 ENCOUNTER — Encounter (HOSPITAL_COMMUNITY)
Admission: RE | Disposition: A | Discharge status: Home / Self Care | Payer: Self-pay | Source: Home / Self Care | Attending: Surgery

## 2020-05-09 ENCOUNTER — Other Ambulatory Visit: Payer: Self-pay

## 2020-05-09 ENCOUNTER — Encounter (HOSPITAL_COMMUNITY): Payer: Self-pay | Admitting: Surgery

## 2020-05-09 ENCOUNTER — Ambulatory Visit (HOSPITAL_COMMUNITY): Payer: BC Managed Care – PPO | Admitting: Vascular Surgery

## 2020-05-09 ENCOUNTER — Ambulatory Visit
Admission: RE | Admit: 2020-05-09 | Discharge: 2020-05-09 | Disposition: A | Discharge status: Home / Self Care | Payer: BC Managed Care – PPO | Source: Ambulatory Visit | Attending: Surgery | Admitting: Surgery

## 2020-05-09 ENCOUNTER — Encounter (HOSPITAL_COMMUNITY)
Admission: RE | Admit: 2020-05-09 | Discharge: 2020-05-09 | Disposition: A | Discharge status: Home / Self Care | Payer: BC Managed Care – PPO | Source: Ambulatory Visit | Attending: Surgery | Admitting: Surgery

## 2020-05-09 ENCOUNTER — Ambulatory Visit (HOSPITAL_COMMUNITY): Payer: BC Managed Care – PPO | Admitting: Certified Registered Nurse Anesthetist

## 2020-05-09 DIAGNOSIS — C50912 Malignant neoplasm of unspecified site of left female breast: Secondary | ICD-10-CM | POA: Diagnosis not present

## 2020-05-09 DIAGNOSIS — N6012 Diffuse cystic mastopathy of left breast: Secondary | ICD-10-CM | POA: Diagnosis not present

## 2020-05-09 DIAGNOSIS — I10 Essential (primary) hypertension: Secondary | ICD-10-CM | POA: Insufficient documentation

## 2020-05-09 DIAGNOSIS — Z170 Estrogen receptor positive status [ER+]: Secondary | ICD-10-CM

## 2020-05-09 DIAGNOSIS — F17200 Nicotine dependence, unspecified, uncomplicated: Secondary | ICD-10-CM | POA: Diagnosis not present

## 2020-05-09 DIAGNOSIS — C50911 Malignant neoplasm of unspecified site of right female breast: Secondary | ICD-10-CM

## 2020-05-09 DIAGNOSIS — C50412 Malignant neoplasm of upper-outer quadrant of left female breast: Secondary | ICD-10-CM | POA: Diagnosis not present

## 2020-05-09 DIAGNOSIS — G8918 Other acute postprocedural pain: Secondary | ICD-10-CM | POA: Diagnosis not present

## 2020-05-09 DIAGNOSIS — Z8616 Personal history of COVID-19: Secondary | ICD-10-CM | POA: Insufficient documentation

## 2020-05-09 DIAGNOSIS — D4989 Neoplasm of unspecified behavior of other specified sites: Secondary | ICD-10-CM | POA: Diagnosis not present

## 2020-05-09 DIAGNOSIS — N6092 Unspecified benign mammary dysplasia of left breast: Secondary | ICD-10-CM | POA: Diagnosis not present

## 2020-05-09 DIAGNOSIS — C50411 Malignant neoplasm of upper-outer quadrant of right female breast: Secondary | ICD-10-CM | POA: Diagnosis not present

## 2020-05-09 DIAGNOSIS — R928 Other abnormal and inconclusive findings on diagnostic imaging of breast: Secondary | ICD-10-CM | POA: Diagnosis not present

## 2020-05-09 DIAGNOSIS — E785 Hyperlipidemia, unspecified: Secondary | ICD-10-CM | POA: Diagnosis not present

## 2020-05-09 DIAGNOSIS — Z79899 Other long term (current) drug therapy: Secondary | ICD-10-CM | POA: Diagnosis not present

## 2020-05-09 DIAGNOSIS — N6489 Other specified disorders of breast: Secondary | ICD-10-CM | POA: Diagnosis not present

## 2020-05-09 HISTORY — PX: SENTINEL NODE BIOPSY: SHX6608

## 2020-05-09 HISTORY — PX: MASTECTOMY, PARTIAL: SHX709

## 2020-05-09 HISTORY — PX: AXILLARY SENTINEL NODE BIOPSY: SHX5738

## 2020-05-09 HISTORY — PX: BREAST LUMPECTOMY WITH RADIOACTIVE SEED LOCALIZATION: SHX6424

## 2020-05-09 SURGERY — BREAST LUMPECTOMY WITH RADIOACTIVE SEED LOCALIZATION
Anesthesia: General | Site: Breast | Laterality: Right

## 2020-05-09 MED ORDER — MIDAZOLAM HCL 2 MG/2ML IJ SOLN
INTRAMUSCULAR | Status: DC | PRN
  Administered 2020-05-09: 2 mg via INTRAVENOUS

## 2020-05-09 MED ORDER — MORPHINE SULFATE (PF) 2 MG/ML IV SOLN
1.0000 mg | INTRAVENOUS | Status: DC | PRN
  Administered 2020-05-09: 2 mg via INTRAVENOUS
  Filled 2020-05-09: qty 1

## 2020-05-09 MED ORDER — CHLORHEXIDINE GLUCONATE 4 % EX LIQD: 60.0000 mL | Freq: Once | CUTANEOUS | Status: DC

## 2020-05-09 MED ORDER — PROPOFOL 10 MG/ML IV BOLUS
INTRAVENOUS | Status: DC | PRN
  Administered 2020-05-09: 130 mg via INTRAVENOUS
  Administered 2020-05-09: 20 mg via INTRAVENOUS

## 2020-05-09 MED ORDER — FENTANYL CITRATE (PF) 250 MCG/5ML IJ SOLN
INTRAMUSCULAR | Status: DC | PRN
  Administered 2020-05-09: 50 ug via INTRAVENOUS
  Administered 2020-05-09: 150 ug via INTRAVENOUS

## 2020-05-09 MED ORDER — HYDROCODONE-ACETAMINOPHEN 5-325 MG PO TABS
1.0000 | ORAL_TABLET | ORAL | Status: DC | PRN
  Administered 2020-05-09 – 2020-05-10 (×3): 2 via ORAL
  Filled 2020-05-09 (×3): qty 2

## 2020-05-09 MED ORDER — HYDROMORPHONE HCL 1 MG/ML IJ SOLN
0.2500 mg | INTRAMUSCULAR | Status: DC | PRN
  Administered 2020-05-09 (×3): 0.25 mg via INTRAVENOUS

## 2020-05-09 MED ORDER — CHLORHEXIDINE GLUCONATE 0.12 % MT SOLN: 15.0000 mL | Freq: Once | OROMUCOSAL | Status: AC

## 2020-05-09 MED ORDER — ROCURONIUM BROMIDE 10 MG/ML (PF) SYRINGE
PREFILLED_SYRINGE | INTRAVENOUS | Status: AC
  Filled 2020-05-09: qty 10

## 2020-05-09 MED ORDER — ALBUTEROL SULFATE HFA 108 (90 BASE) MCG/ACT IN AERS: 2.0000 | INHALATION_SPRAY | Freq: Four times a day (QID) | RESPIRATORY_TRACT | Status: DC | PRN

## 2020-05-09 MED ORDER — 0.9 % SODIUM CHLORIDE (POUR BTL) OPTIME
TOPICAL | Status: DC | PRN
  Administered 2020-05-09: 1000 mL

## 2020-05-09 MED ORDER — CEFAZOLIN SODIUM-DEXTROSE 2-3 GM-%(50ML) IV SOLR
INTRAVENOUS | Status: DC | PRN
  Administered 2020-05-09: 2 g via INTRAVENOUS

## 2020-05-09 MED ORDER — OXYCODONE HCL 5 MG PO TABS: 5.0000 mg | ORAL_TABLET | Freq: Once | ORAL | Status: DC | PRN

## 2020-05-09 MED ORDER — FENTANYL CITRATE (PF) 250 MCG/5ML IJ SOLN
INTRAMUSCULAR | Status: AC
  Filled 2020-05-09: qty 5

## 2020-05-09 MED ORDER — SUCCINYLCHOLINE CHLORIDE 200 MG/10ML IV SOSY
PREFILLED_SYRINGE | INTRAVENOUS | Status: AC
  Filled 2020-05-09: qty 10

## 2020-05-09 MED ORDER — ACETAMINOPHEN 500 MG PO TABS
1000.0000 mg | ORAL_TABLET | ORAL | Status: AC
  Administered 2020-05-09: 1000 mg via ORAL
  Filled 2020-05-09: qty 2

## 2020-05-09 MED ORDER — ONDANSETRON HCL 4 MG/2ML IJ SOLN
INTRAMUSCULAR | Status: AC
  Filled 2020-05-09: qty 2

## 2020-05-09 MED ORDER — DEXAMETHASONE SODIUM PHOSPHATE 10 MG/ML IJ SOLN
INTRAMUSCULAR | Status: DC | PRN
  Administered 2020-05-09: 10 mg via INTRAVENOUS

## 2020-05-09 MED ORDER — AZELASTINE HCL 0.1 % NA SOLN: 2.0000 | Freq: Two times a day (BID) | NASAL | Status: DC | PRN

## 2020-05-09 MED ORDER — ORAL CARE MOUTH RINSE: 15.0000 mL | Freq: Once | OROMUCOSAL | Status: AC

## 2020-05-09 MED ORDER — LIDOCAINE 2% (20 MG/ML) 5 ML SYRINGE
INTRAMUSCULAR | Status: DC | PRN
  Administered 2020-05-09: 100 mg via INTRAVENOUS

## 2020-05-09 MED ORDER — FENTANYL CITRATE (PF) 100 MCG/2ML IJ SOLN
INTRAMUSCULAR | Status: AC
  Administered 2020-05-09: 50 ug via INTRAVENOUS
  Filled 2020-05-09: qty 2

## 2020-05-09 MED ORDER — MIDAZOLAM HCL 2 MG/2ML IJ SOLN: 2.0000 mg | Freq: Once | INTRAMUSCULAR | Status: AC

## 2020-05-09 MED ORDER — CEFAZOLIN SODIUM-DEXTROSE 2-4 GM/100ML-% IV SOLN
INTRAVENOUS | Status: AC
  Filled 2020-05-09: qty 100

## 2020-05-09 MED ORDER — HYDROMORPHONE HCL 1 MG/ML IJ SOLN
INTRAMUSCULAR | Status: AC
  Filled 2020-05-09: qty 1

## 2020-05-09 MED ORDER — PROMETHAZINE HCL 25 MG/ML IJ SOLN: 6.2500 mg | INTRAMUSCULAR | Status: DC | PRN

## 2020-05-09 MED ORDER — ONDANSETRON HCL 4 MG/2ML IJ SOLN
INTRAMUSCULAR | Status: DC | PRN
  Administered 2020-05-09: 4 mg via INTRAVENOUS

## 2020-05-09 MED ORDER — BUPIVACAINE HCL (PF) 0.25 % IJ SOLN
INTRAMUSCULAR | Status: AC
  Filled 2020-05-09: qty 30

## 2020-05-09 MED ORDER — ROCURONIUM BROMIDE 10 MG/ML (PF) SYRINGE
PREFILLED_SYRINGE | INTRAVENOUS | Status: DC | PRN
  Administered 2020-05-09: 90 mg via INTRAVENOUS

## 2020-05-09 MED ORDER — BUPIVACAINE-EPINEPHRINE (PF) 0.5% -1:200000 IJ SOLN
INTRAMUSCULAR | Status: DC | PRN
  Administered 2020-05-09: 30 mL

## 2020-05-09 MED ORDER — OXYCODONE HCL 5 MG/5ML PO SOLN: 5.0000 mg | Freq: Once | ORAL | Status: DC | PRN

## 2020-05-09 MED ORDER — LIDOCAINE-EPINEPHRINE (PF) 1.5 %-1:200000 IJ SOLN
INTRAMUSCULAR | Status: DC | PRN
  Administered 2020-05-09: 30 mL

## 2020-05-09 MED ORDER — SUGAMMADEX SODIUM 200 MG/2ML IV SOLN
INTRAVENOUS | Status: DC | PRN
  Administered 2020-05-09: 200 mg via INTRAVENOUS

## 2020-05-09 MED ORDER — PHENYLEPHRINE 40 MCG/ML (10ML) SYRINGE FOR IV PUSH (FOR BLOOD PRESSURE SUPPORT)
PREFILLED_SYRINGE | INTRAVENOUS | Status: AC
  Filled 2020-05-09: qty 10

## 2020-05-09 MED ORDER — BUPIVACAINE HCL (PF) 0.25 % IJ SOLN: INTRAMUSCULAR | Status: DC | PRN

## 2020-05-09 MED ORDER — PHENYLEPHRINE HCL-NACL 10-0.9 MG/250ML-% IV SOLN
INTRAVENOUS | Status: DC | PRN
  Administered 2020-05-09: 20 ug/min via INTRAVENOUS

## 2020-05-09 MED ORDER — ACETAMINOPHEN 325 MG PO TABS
650.0000 mg | ORAL_TABLET | Freq: Four times a day (QID) | ORAL | Status: DC
  Administered 2020-05-09 – 2020-05-10 (×2): 650 mg via ORAL
  Filled 2020-05-09 (×2): qty 2

## 2020-05-09 MED ORDER — METHYLENE BLUE 0.5 % INJ SOLN
INTRAVENOUS | Status: AC
  Filled 2020-05-09: qty 20

## 2020-05-09 MED ORDER — MIDAZOLAM HCL 2 MG/2ML IJ SOLN
INTRAMUSCULAR | Status: AC
  Filled 2020-05-09: qty 2

## 2020-05-09 MED ORDER — DEXAMETHASONE SODIUM PHOSPHATE 10 MG/ML IJ SOLN
INTRAMUSCULAR | Status: AC
  Filled 2020-05-09: qty 1

## 2020-05-09 MED ORDER — LIDOCAINE 2% (20 MG/ML) 5 ML SYRINGE
INTRAMUSCULAR | Status: AC
  Filled 2020-05-09: qty 5

## 2020-05-09 MED ORDER — TRAMADOL HCL 50 MG PO TABS: 50.0000 mg | ORAL_TABLET | Freq: Four times a day (QID) | ORAL | Status: DC | PRN

## 2020-05-09 MED ORDER — MIDAZOLAM HCL 2 MG/2ML IJ SOLN
INTRAMUSCULAR | Status: AC
  Administered 2020-05-09: 2 mg via INTRAVENOUS
  Filled 2020-05-09: qty 2

## 2020-05-09 MED ORDER — LABETALOL HCL 5 MG/ML IV SOLN
INTRAVENOUS | Status: DC | PRN
  Administered 2020-05-09: 5 mg via INTRAVENOUS

## 2020-05-09 MED ORDER — MEPERIDINE HCL 25 MG/ML IJ SOLN: 6.2500 mg | INTRAMUSCULAR | Status: DC | PRN

## 2020-05-09 MED ORDER — LACTATED RINGERS IV SOLN: INTRAVENOUS | Status: DC

## 2020-05-09 MED ORDER — ONDANSETRON 4 MG PO TBDP: 4.0000 mg | ORAL_TABLET | Freq: Four times a day (QID) | ORAL | Status: DC | PRN

## 2020-05-09 MED ORDER — KCL IN DEXTROSE-NACL 20-5-0.45 MEQ/L-%-% IV SOLN
INTRAVENOUS | Status: DC
  Filled 2020-05-09: qty 1000

## 2020-05-09 MED ORDER — PHENYLEPHRINE HCL (PRESSORS) 10 MG/ML IV SOLN
INTRAVENOUS | Status: AC
  Filled 2020-05-09: qty 1

## 2020-05-09 MED ORDER — PROPOFOL 10 MG/ML IV BOLUS
INTRAVENOUS | Status: AC
  Filled 2020-05-09: qty 20

## 2020-05-09 MED ORDER — ENOXAPARIN SODIUM 40 MG/0.4ML ~~LOC~~ SOLN
40.0000 mg | SUBCUTANEOUS | Status: DC
  Administered 2020-05-10: 40 mg via SUBCUTANEOUS
  Filled 2020-05-09: qty 0.4

## 2020-05-09 MED ORDER — TECHNETIUM TC 99M SULFUR COLLOID FILTERED
1.0000 | Freq: Once | INTRAVENOUS | Status: AC | PRN
  Administered 2020-05-09: 1 via INTRADERMAL

## 2020-05-09 MED ORDER — ONDANSETRON HCL 4 MG/2ML IJ SOLN: 4.0000 mg | Freq: Four times a day (QID) | INTRAMUSCULAR | Status: DC | PRN

## 2020-05-09 MED ORDER — FENTANYL CITRATE (PF) 100 MCG/2ML IJ SOLN: 50.0000 ug | Freq: Once | INTRAMUSCULAR | Status: AC

## 2020-05-09 MED ORDER — SODIUM CHLORIDE (PF) 0.9 % IJ SOLN
INTRAMUSCULAR | Status: AC
  Filled 2020-05-09: qty 10

## 2020-05-09 MED ORDER — CHLORHEXIDINE GLUCONATE 0.12 % MT SOLN
OROMUCOSAL | Status: AC
  Administered 2020-05-09: 15 mL via OROMUCOSAL
  Filled 2020-05-09: qty 15

## 2020-05-09 MED ORDER — AMLODIPINE BESYLATE 5 MG PO TABS
2.5000 mg | ORAL_TABLET | Freq: Every morning | ORAL | Status: DC
  Administered 2020-05-10: 2.5 mg via ORAL
  Filled 2020-05-09: qty 1

## 2020-05-09 SURGICAL SUPPLY — 52 items
ADH SKN CLS APL DERMABOND .7 (GAUZE/BANDAGES/DRESSINGS) ×16
APL PRP STRL LF DISP 70% ISPRP (MISCELLANEOUS) ×4
BINDER BREAST LRG (GAUZE/BANDAGES/DRESSINGS) IMPLANT
BINDER BREAST XLRG (GAUZE/BANDAGES/DRESSINGS) ×1 IMPLANT
BIOPATCH RED 1 DISK 7.0 (GAUZE/BANDAGES/DRESSINGS) ×2 IMPLANT
CANISTER SUCT 3000ML PPV (MISCELLANEOUS) IMPLANT
CHLORAPREP W/TINT 26 (MISCELLANEOUS) ×5 IMPLANT
CLIP VESOCCLUDE SM WIDE 6/CT (CLIP) ×6 IMPLANT
CNTNR URN SCR LID CUP LEK RST (MISCELLANEOUS) IMPLANT
CONT SPEC 4OZ STRL OR WHT (MISCELLANEOUS) ×5
COVER PROBE W GEL 5X96 (DRAPES) ×5 IMPLANT
COVER SURGICAL LIGHT HANDLE (MISCELLANEOUS) ×5 IMPLANT
COVER WAND RF STERILE (DRAPES) ×5 IMPLANT
DECANTER SPIKE VIAL GLASS SM (MISCELLANEOUS) ×5 IMPLANT
DERMABOND ADVANCED (GAUZE/BANDAGES/DRESSINGS) ×4
DERMABOND ADVANCED .7 DNX12 (GAUZE/BANDAGES/DRESSINGS) ×4 IMPLANT
DEVICE DUBIN SPECIMEN MAMMOGRA (MISCELLANEOUS) ×5 IMPLANT
DRAIN CHANNEL 19F RND (DRAIN) ×2 IMPLANT
DRSG PAD ABDOMINAL 8X10 ST (GAUZE/BANDAGES/DRESSINGS) ×8 IMPLANT
DRSG TEGADERM 2-3/8X2-3/4 SM (GAUZE/BANDAGES/DRESSINGS) ×2 IMPLANT
ELECT COATED BLADE 2.86 ST (ELECTRODE) ×5 IMPLANT
ELECT REM PT RETURN 9FT ADLT (ELECTROSURGICAL) ×5
ELECTRODE REM PT RTRN 9FT ADLT (ELECTROSURGICAL) ×4 IMPLANT
EVACUATOR SILICONE 100CC (DRAIN) ×2 IMPLANT
GAUZE SPONGE 4X4 12PLY STRL (GAUZE/BANDAGES/DRESSINGS) ×8 IMPLANT
GLOVE SURG SYN 7.5  E (GLOVE) ×5
GLOVE SURG SYN 7.5 E (GLOVE) ×4 IMPLANT
GLOVE SURG SYN 7.5 PF PI (GLOVE) ×4 IMPLANT
GOWN STRL REUS W/ TWL LRG LVL3 (GOWN DISPOSABLE) ×4 IMPLANT
GOWN STRL REUS W/ TWL XL LVL3 (GOWN DISPOSABLE) ×4 IMPLANT
GOWN STRL REUS W/TWL LRG LVL3 (GOWN DISPOSABLE) ×5
GOWN STRL REUS W/TWL XL LVL3 (GOWN DISPOSABLE) ×5
ILLUMINATOR WAVEGUIDE N/F (MISCELLANEOUS) ×4 IMPLANT
KIT BASIN OR (CUSTOM PROCEDURE TRAY) ×5 IMPLANT
KIT MARKER MARGIN INK (KITS) ×5 IMPLANT
KIT TURNOVER KIT B (KITS) ×5 IMPLANT
LIGHT WAVEGUIDE WIDE FLAT (MISCELLANEOUS) ×4 IMPLANT
NDL HYPO 25GX1X1/2 BEV (NEEDLE) ×4 IMPLANT
NEEDLE HYPO 25GX1X1/2 BEV (NEEDLE) ×5 IMPLANT
NS IRRIG 1000ML POUR BTL (IV SOLUTION) ×5 IMPLANT
PACK GENERAL/GYN (CUSTOM PROCEDURE TRAY) ×5 IMPLANT
PAD ARMBOARD 7.5X6 YLW CONV (MISCELLANEOUS) ×5 IMPLANT
PENCIL SMOKE EVACUATOR (MISCELLANEOUS) ×5 IMPLANT
SPONGE LAP 18X18 RF (DISPOSABLE) ×1 IMPLANT
SPONGE LAP 4X18 RFD (DISPOSABLE) ×5 IMPLANT
SUT ETHILON 2 0 FS 18 (SUTURE) ×2 IMPLANT
SUT MNCRL AB 4-0 PS2 18 (SUTURE) ×7 IMPLANT
SUT SILK 2 0 SH (SUTURE) ×1 IMPLANT
SUT VIC AB 3-0 SH 18 (SUTURE) ×8 IMPLANT
SYR CONTROL 10ML LL (SYRINGE) ×6 IMPLANT
TOWEL GREEN STERILE (TOWEL DISPOSABLE) ×5 IMPLANT
TOWEL GREEN STERILE FF (TOWEL DISPOSABLE) ×5 IMPLANT

## 2020-05-09 NOTE — Anesthesia Procedure Notes (Signed)
Anesthesia Regional Block: Pectoralis block   Pre-Anesthetic Checklist: ,, timeout performed, Correct Patient, Correct Site, Correct Laterality, Correct Procedure, Correct Position, site marked, Risks and benefits discussed,  Surgical consent,  Pre-op evaluation,  At surgeon's request and post-op pain management  Laterality: Right and Left  Prep: chloraprep       Needles:  Injection technique: Single-shot     Needle Length: 9cm  Needle Gauge: 21     Additional Needles:   Narrative:  Start time: 05/09/2020 12:35 PM End time: 05/09/2020 12:50 PM Injection made incrementally with aspirations every 5 mL.  Performed by: Personally  Anesthesiologist: Lillia Abed, MD  Additional Notes: Monitors applied. Patient sedated. Bilaterally sterile prep and drape,hand hygiene and sterile gloves were used. Relevant anatomy identified bilaterally.Needle position confirmed.Local anesthetic injected incrementally after negative aspiration. Local anesthetic spread visualized. Vascular puncture avoided. No complications. Images printed for medical record.The patient tolerated the procedure well.

## 2020-05-09 NOTE — Op Note (Signed)
05/09/2020  2:35 PM  PATIENT:  [name] [name] [facility] DOB: 1957-10-203 MRN: [id number]  PREOP DIAGNOSIS:   BILATERAL BREAST CANCER  POSTOP DIAGNOSIS:    Right breast cancer, 12 o'clock position (T1, N0)  Left breast cancer, UOQ (T2, N0)  PROCEDURE:   Procedure(s):  RIGHT BREAST LUMPECTOMY WITH RIGHT AXILLARY SENTINEL LYMPH NODE BIOPSY,  LEFT MASTECTOMY, LEFT AXILLARY SENTINEL LYMPH NODE BIOPSY  SURGEON:   Alphonsa Overall, M.D.  Assistant:  Carlena Hurl, PA  ANESTHESIA:   General  Anesthesiologist: Lillia Abed, MD CRNA: Darletta Moll, CRNA  General  EBL:  150 cc  ml  DRAINS:  2 19 F Blake drain for the left mastectomy  LOCAL MEDICATIONS USED:   Bilateral pectoralis blocks by anesthesia  SPECIMEN:   Right breast lumpectomy (6 color paint), right axillary sentinel lymph node biopsy (Counts - 200, background - 20), Medial margin of right breast lumpectomy,  Left mastectomy (suture lateral), left axillary sentinel lymph node biopsy (Counts - 1,200, background - 30), superior skin flap (long suture lateral and short suture inferior)  COUNTS CORRECT:  YES  INDICATIONS FOR PROCEDURE:  PRESCIOUS HURLESS is a 64 y.o. (DOB: 1956/05/03) white female whose primary care physician is [name], MD and comes with bilateral breast cancers.  Her right breast cancer is amendable to right breast lumpectomy and right axillary sentinel lymph node biopsy.  Her left breast cancer involves much of the central left breast and is better treated with a left mastectomy.  She will get a left axillary sentinel lymph node biopsy.   She's seen Drs. Remus Blake for oncology.  The options for breast cancer treatment have been discussed with the patient.     The indications and potential complications of surgery were explained to the patient. Potential complications include, but are not limited to, bleeding, infection, the need for further surgery, and nerve injury.     She had a I131 seed placed on 05/08/2020  in her right breast at [facility].  The seed is in the 12 o'clock position of the right breast.   In the holding area, both her areola were injected with 1 millicurie of Technitium Sulfur Colloid.  OPERATIVE NOTE:   The patient was taken to operating room # 9 at [facility]  where she underwent a general anesthesia  supervised by Anesthesiologist: Lillia Abed, MD CRNA: Darletta Moll, CRNA. Both her breasts and axilla were prepped with  ChloraPrep and sterilely draped.    A time-out was held and the surgical check list was reviewed.    I started with the right breast cancer.  The cancer was about at the 12 o'clock position of the right breast.   It was 5 cm from the areola.  I used the Neoprobe to identify the I131 seed.  I tried to excise an area around the tumor of at least 1 cm.    I excised this block of breast tissue approximately 3 cm by 4 cm  in diameter.   I painted the lumpectomy specimen with the 6 color paint kit and did a specimen mammogram which confirmed the mass, clip, and the seed were all in the right position in the specimen.  The specimen was sent to pathology who called back to confirm that they have the seed and the specimen.   The tumor appeared closest to the medial margin on the specimen mammogram, so I did excise the medial margin and sent this as a separate specimen.  I then started the right deep axillary sentinel lymph node biopsy. I made an incision in the right axilla.  I found a hot area at the junction of the breast and the pectoralis major muscle, deep in the axilla. I cut down and  identified a hot node that had counts of 200 and the background has 20 counts. I checked her internal mammary nodes and supraclavicular nodes with the neoprobe and found no other hot area. The axillary node was then sent to pathology.    I then irrigated the wound with saline. I placed 4 clips to mark biopsy cavity, at 12, 3, 6, and 9 o'clock.  I then closed all the wounds  in layers using 3-0 Vicryl sutures for the deep layer. At the skin, I closed the incisions with a 4-0 Monocryl suture.    I then turned attention to the left breast cancer.  I made an elliptical incision including the areola in the left breast.  I developed skin flaps medially to the lateral edge of the sternum, inferiorly to the investing fascia of the rectus abdominus muscle, laterally to the anterior edge of the latissimus dorsi muscle, and superiorly to about 2 finger breaths below the clavicle.  The breast was reflected off the pectoralis muscle from medial to lateral.  The lateral attachments in the left axilla were divided and the breast removed.  A long suture was placed on the lateral aspect of the breast.   I dissected into the left axilla and found a deep sentinel lymph node.  The node had counts of 1,200 with a background count of 30.  This was sent as a separate specimen.   I brought out 2 53 F Blake drains below the inferior flaps.  These were sewn in place with 2-0 Nylons.  I irrigated the wound with 2,000 cc of fluid.   The skin was closed with interrupted 3-0 Vicryl sutures and the skin was closed with a 4-0 Monocryl.  The incisions were painted with Dermabond.    A pressure dressing was placed on the wound and the chest wrapped with a breast binder.  Her needle and sponge count were correct at the end of the case.   The patient tolerated the procedure well, was transported to the recovery room in good condition. Sponge and needle count were correct at the end of the case.   Final pathology is pending.   Alphonsa Overall, MD, [facility] Surgery Pager: [phone] Office phone:  [phone]

## 2020-05-09 NOTE — Progress Notes (Signed)
Per Dr. Conrad Hallstead, okay to start IV in foot.  Per Dr. [name], okay to pull Ancef.

## 2020-05-09 NOTE — Interval H&P Note (Signed)
History and Physical Interval Note:  05/09/2020 2:25 PM  [name] [name] [facility]  has presented today for surgery, with the diagnosis of BILATERAL BREAST CANCER.  The various methods of treatment have been discussed with the patient and family. Her husband is here with her.  Her seed is in place.  After consideration of risks, benefits and other options for treatment, the patient has consented to  Procedure(s): RIGHT BREAST LUMPECTOMY WITH RIGHT AXILLARY SENTINEL LYMPH NODE BIOPSY, LEFT AXILLARY SENTINEL LYMPH NODE BIOPSY (Bilateral) LEFT MASECTOMY (Left) as a surgical intervention.  [name] history has been reviewed, patient examined, no change in status, stable for surgery.  I have reviewed the patient's chart and labs.  Questions were answered to the patient's satisfaction.     Shann Medal

## 2020-05-09 NOTE — Anesthesia Procedure Notes (Signed)
Procedure Name: Intubation Date/Time: 05/09/2020 3:00 PM Performed by: Darletta Moll, CRNA Pre-anesthesia Checklist: Patient identified, Emergency Drugs available, Suction available and Patient being monitored Patient Re-evaluated:Patient Re-evaluated prior to induction Oxygen Delivery Method: Circle system utilized Preoxygenation: Pre-oxygenation with 100% oxygen Induction Type: IV induction Ventilation: Mask ventilation without difficulty Laryngoscope Size: Mac and 3 Grade View: Grade I Tube type: Oral Tube size: 7.0 mm Number of attempts: 1 Airway Equipment and Method: Stylet Placement Confirmation: ETT inserted through vocal cords under direct vision,  positive ETCO2 and breath sounds checked- equal and bilateral Secured at: 21 cm Tube secured with: Tape Dental Injury: Teeth and Oropharynx as per pre-operative assessment

## 2020-05-09 NOTE — Transfer of Care (Signed)
Immediate Anesthesia Transfer of Care Note  Patient: [name] [name] [facility]  Procedure(s) Performed: RIGHT BREAST LUMPECTOMY WITH RADIOACTIVE SEED (Right Breast) LEFT MASECTOMY (Left Breast) LEFT AXILLARY SENTINEL NODE BIOPSY (Left Axilla) RIGHT AXILLARY SENTINEL NODE BIOPSY (Right Axilla)  Patient Location: PACU  Anesthesia Type:General  Level of Consciousness: drowsy and patient cooperative  Airway & Oxygen Therapy: Patient Spontanous Breathing and Patient connected to nasal cannula oxygen  Post-op Assessment: Report given to RN, Post -op Vital signs reviewed and stable and Patient moving all extremities  Post vital signs: Reviewed and stable  Last Vitals:  Vitals Value Taken Time  BP    Temp    Pulse 71 05/09/20 1733  Resp 21 05/09/20 1734  SpO2 94 % 05/09/20 1733  Vitals shown include unvalidated device data.  Last Pain:  Vitals:   05/09/20 1136  TempSrc:   PainSc: 0-No pain         Complications: No complications documented.

## 2020-05-10 ENCOUNTER — Encounter (HOSPITAL_COMMUNITY): Payer: Self-pay | Admitting: Surgery

## 2020-05-10 DIAGNOSIS — C50912 Malignant neoplasm of unspecified site of left female breast: Secondary | ICD-10-CM | POA: Diagnosis not present

## 2020-05-10 DIAGNOSIS — F17200 Nicotine dependence, unspecified, uncomplicated: Secondary | ICD-10-CM | POA: Diagnosis not present

## 2020-05-10 DIAGNOSIS — Z8616 Personal history of COVID-19: Secondary | ICD-10-CM | POA: Diagnosis not present

## 2020-05-10 DIAGNOSIS — Z79899 Other long term (current) drug therapy: Secondary | ICD-10-CM | POA: Diagnosis not present

## 2020-05-10 DIAGNOSIS — C50911 Malignant neoplasm of unspecified site of right female breast: Secondary | ICD-10-CM | POA: Diagnosis not present

## 2020-05-10 DIAGNOSIS — I10 Essential (primary) hypertension: Secondary | ICD-10-CM | POA: Diagnosis not present

## 2020-05-10 MED ORDER — HYDROCODONE-ACETAMINOPHEN 5-325 MG PO TABS: 1.0000 | ORAL_TABLET | Freq: Four times a day (QID) | ORAL | 0 refills | Status: DC | PRN

## 2020-05-10 NOTE — Plan of Care (Signed)
Patient alert and oriented, Mae's well, voiding adequate amount of urine, swallowing without difficulty, No c/o at the time of discharged. Patient discharged home with spouse. Script and discharged instructions given to patient. Patient and spouse was given instructions on how to maintain and empty JP drain at home. Patient and family stated understanding of instructions given. Patient has an appointment with Dr. [name] in 1 week.

## 2020-05-10 NOTE — Discharge Instructions (Signed)
[facility] - DISCHARGE INSTRUCTIONS TO PATIENT  Activity:  Driving - May drive in 3 to 5 days, when doing well   Lifting - No lifting more than 15 pounds for 7 days, then no limit                       Practice your Covid-19 protection:  Wear a mask, social distance, and wash your hands frequently  Wound Care:   Leave the incision dry for 2 days, then you may shower.            Empty drains out twice a day and record the amount of drainage.   Bring those numbers with you to the office.  Diet:  As tolerated  Follow up appointment:  Call Dr. Pollie Friar office [facility]) at [phone] for an appointment in 1 week.  Medications and dosages:  Resume your home medications.  You have a prescription for:  Ultram             You may also take Tylenol, ibuprofen, or Aleve for pain  Call Dr. [name] or his office  [phone]) if you have:  Temperature greater than 100.4,  Persistent nausea and vomiting,  Severe uncontrolled pain,  Redness, tenderness, or signs of infection (pain, swelling, redness, odor or green/yellow discharge around the site),  Difficulty breathing, headache or visual disturbances,  Any other questions or concerns you may have after discharge.  In an emergency, call 911 or go to an Emergency Department at a nearby hospital.

## 2020-05-10 NOTE — Anesthesia Postprocedure Evaluation (Signed)
Anesthesia Post Note  Patient: [name] [name] [facility]  Procedure(s) Performed: RIGHT BREAST LUMPECTOMY WITH RADIOACTIVE SEED (Right Breast) LEFT MASECTOMY (Left Breast) LEFT AXILLARY SENTINEL NODE BIOPSY (Left Axilla) RIGHT AXILLARY SENTINEL NODE BIOPSY (Right Axilla)     Patient location during evaluation: PACU Anesthesia Type: General Level of consciousness: awake and alert Pain management: pain level controlled Vital Signs Assessment: post-procedure vital signs reviewed and stable Respiratory status: spontaneous breathing, nonlabored ventilation and respiratory function stable Cardiovascular status: blood pressure returned to baseline and stable Postop Assessment: no apparent nausea or vomiting Anesthetic complications: no   No complications documented.  Last Vitals:  Vitals:   05/10/20 0422 05/10/20 0806  BP: 126/63 (!) 147/65  Pulse: 68 67  Resp: 18 18  Temp: 36.6 C 36.7 C  SpO2: 93% 95%    Last Pain:  Vitals:   05/10/20 0806  TempSrc: Oral  PainSc:                  Lynda Rainwater

## 2020-05-10 NOTE — Discharge Summary (Signed)
Physician Discharge Summary  Patient ID:  [name] [name]  MRN: [id number]  DOB/AGE: Sep 07, 1955 64 y.o.  Admit date: 05/09/2020 Discharge date: 05/10/2020  Discharge Diagnoses:  1.  Bilateral breast cancers 2.  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 HTN 4. GERD   Active Problems:   Bilateral breast cancer ([place])  Operation: Procedure(s): RIGHT BREAST LUMPECTOMY WITH RADIOACTIVE SEED, RIGHT AXILLARY SENTINEL NODE BIOPSY, LEFT MASTECTOMY, LEFT AXILLARY SENTINEL NODE BIOPSY on 05/09/2020 [name] [name]  Discharged Condition: good  Hospital Course: JACQUI HEADEN is an 64 y.o. female whose primary care physician is [name], MD and who was admitted 05/09/2020 with a chief complaint of bilateral breast cancers.   Her right breast cancer was small and amendable to a lumpectomy, but her left breast cancer involved a large portion of her left breast and is best treated with a mastectomy.  She was not interested in reconstruction at this time.  She was brought to the operating room on 05/09/2020 and underwent  RIGHT BREAST LUMPECTOMY WITH RADIOACTIVE SEED, RIGHT AXILLARY SENTINEL NODE BIOPSY, LEFT MASTECTOMY, LEFT AXILLARY SENTINEL NODE BIOPSY.   Her husband is at her bedside.  She has done well. She is moving her arms well.  She is ready for discharge. The discharge instructions were reviewed with the patient.  Consults: None  Significant Diagnostic Studies: Results for orders placed or performed during the hospital encounter of 05/05/20  SARS CORONAVIRUS 2 (TAT 6-24 HRS) Nasopharyngeal Nasopharyngeal Swab   Specimen: Nasopharyngeal Swab  Result Value Ref Range   SARS Coronavirus 2 NEGATIVE NEGATIVE    NM Sentinel Node Inj-No Rpt (Breast)  Result Date: 05/09/2020 Sulfur colloid was injected by the nuclear medicine technologist for melanoma sentinel node.   MM Breast Surgical Specimen  Result Date: 05/09/2020 CLINICAL DATA:  Evaluate specimen EXAM: SPECIMEN RADIOGRAPH OF THE RIGHT BREAST COMPARISON:   Previous exam(s). FINDINGS: Status post excision of the right breast. The radioactive seed and biopsy marker clip are present, completely intact, and were marked for pathology. IMPRESSION: Specimen radiograph of the right breast. Electronically Signed   By: Dorise Bullion III M.D   On: 05/09/2020 15:36   MM RT RADIOACTIVE SEED LOC MAMMO GUIDE  Result Date: 05/08/2020 CLINICAL DATA:  64 year old female presenting for radioactive seed localization of the right breast prior to lumpectomy. EXAM: MAMMOGRAPHIC GUIDED RADIOACTIVE SEED LOCALIZATION OF THE RIGHT BREAST COMPARISON:  Previous exam(s). FINDINGS: Patient presents for radioactive seed localization prior to right breast lumpectomy. I met with the patient and we discussed the procedure of seed localization including benefits and alternatives. We discussed the high likelihood of a successful procedure. We discussed the risks of the procedure including infection, bleeding, tissue injury and further surgery. We discussed the low dose of radioactivity involved in the procedure. Informed, written consent was given. The usual time-out protocol was performed immediately prior to the procedure. Using mammographic guidance, sterile technique, 1% lidocaine and an I-125 radioactive seed, the ribbon shaped biopsy marking clip within the mass in the upper inner right breast was localized using a superior approach. The follow-up mammogram images confirm the seed in the expected location and were marked for Dr. Lucia [name]. Follow-up survey of the patient confirms presence of the radioactive seed. Order number of I-125 seed:  [id number]. Total activity:  9.629 millicuries reference Date: 04/18/2020 The patient tolerated the procedure well and was released from the [facility]. She was given instructions regarding seed removal. IMPRESSION: Radioactive seed localization right breast. No apparent complications. Electronically Signed   By:  Ammie Ferrier M.D.   On: 05/08/2020  14:19   MM CLIP PLACEMENT LEFT  Result Date: 04/20/2020 CLINICAL DATA:  Status post MRI guided core biopsy left breast EXAM: DIAGNOSTIC left MAMMOGRAM POST MRI BIOPSY COMPARISON:  Prior films FINDINGS: Mammographic images were obtained following MRI guided biopsy of lateral upper quadrant focal enhancement. The biopsy marking clip is in expected position at the site of biopsy. Mammographic images were obtained following MRI guided biopsy of medial upper quadrant focal enhancement. The biopsy marking clip is in expected position at the site of biopsy. IMPRESSION: Appropriate positioning of the cylinder shaped biopsy marking clip at the site of biopsy in the expected area of concern in the upper lateral quadrant. Appropriate positioning of the dumbbell shaped biopsy marking clip at the site of biopsy in the expected area of concern in the upper medial quadrant. Final Assessment: Post Procedure Mammograms for Marker Placement Electronically Signed   By: Abelardo Diesel M.D.   On: 04/20/2020 11:09   MR LT BREAST BX W LOC DEV 1ST LESION IMAGE BX SPEC MR GUIDE  Result Date: 04/20/2020 CLINICAL DATA:  Recent diagnosis of left breast cancer. Two additional MR guided core biopsies are recommended in the medial upper and lateral upper breast areas of enhancement to assess extent of disease. EXAM: MRI GUIDED CORE NEEDLE BIOPSY OF THE left BREAST TECHNIQUE: Multiplanar, multisequence MR imaging of the left breast was performed both before and after administration of intravenous contrast. CONTRAST:  8 mL Gadavist COMPARISON:  Previous exams. FINDINGS: I met with the patient, and we discussed the procedure of MRI guided biopsy, including risks, benefits, and alternatives. Specifically, we discussed the risks of infection, bleeding, tissue injury, clip migration, and inadequate sampling. Informed, written consent was given. The usual time out protocol was performed immediately prior to the procedure. Using sterile technique,  1% Lidocaine, MRI guidance, and a 9 gauge vacuum assisted device, biopsy was performed of focal enhancement in the upper medial left breast using a medial approach. At the conclusion of the procedure, a dumbbell tissue marker clip was deployed into the biopsy cavity. Follow-up 2-view mammogram was performed and dictated separately. Using sterile technique, 1% Lidocaine, MRI guidance, and a 9 gauge vacuum assisted device, biopsy was performed of focal enhancement in the upper lateral left breast using a lateral approach. At the conclusion of the procedure, a cylinder tissue marker clip was deployed into the biopsy cavity. Follow-up 2-view mammogram was performed and dictated separately. IMPRESSION: MRI guided biopsy of left breast. No apparent complications. Electronically Signed   By: Abelardo Diesel M.D.   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 This was found to be concordant by Dr. Abelardo Diesel. Pathology revealed GRADE II INVASIVE MAMMARY CARCINOMA WITH CALCIFICATIONS of the LEFT breast, medial upper. This was found to be concordant by Dr. Abelardo Diesel. Pathology results were discussed with the patient by telephone. The patient reported doing well after the biopsies with tenderness at the sites. Post biopsy instructions and care were reviewed and questions were answered. The patient was encouraged to call [facility] for any additional concerns. The patient has a recent diagnosis of bilateral breast cancer and should follow her outlined treatment plan. Pathology results reported by Stacie Acres RN on  04/21/2020. Electronically Signed   By: Abelardo Diesel M.D.   On: 04/21/2020 14:52   Result Date: 04/21/2020 CLINICAL DATA:  Recent diagnosis of left breast  cancer. Two additional MR guided core biopsies are recommended in the medial upper and lateral upper breast areas of enhancement to assess extent of disease. EXAM: MRI GUIDED CORE NEEDLE BIOPSY OF THE left BREAST TECHNIQUE: Multiplanar, multisequence MR imaging of the left breast was performed both before and after administration of intravenous contrast. CONTRAST:  8 mL Gadavist COMPARISON:  Previous exams. FINDINGS: I met with the patient, and we discussed the procedure of MRI guided biopsy, including risks, benefits, and alternatives. Specifically, we discussed the risks of infection, bleeding, tissue injury, clip migration, and inadequate sampling. Informed, written consent was given. The usual time out protocol was performed immediately prior to the procedure. Using sterile technique, 1% Lidocaine, MRI guidance, and a 9 gauge vacuum assisted device, biopsy was performed of focal enhancement in the upper medial left breast using a medial approach. At the conclusion of the procedure, a dumbbell tissue marker clip was deployed into the biopsy cavity. Follow-up 2-view mammogram was performed and dictated separately. Using sterile technique, 1% Lidocaine, MRI guidance, and a 9 gauge vacuum assisted device, biopsy was performed of focal enhancement in the upper lateral left breast using a lateral approach. At the conclusion of the procedure, a cylinder tissue marker clip was deployed into the biopsy cavity. Follow-up 2-view mammogram was performed and dictated separately. IMPRESSION: MRI guided biopsy of left breast. No apparent complications. Electronically Signed: By: Abelardo Diesel M.D. On: 04/20/2020 10:34    Discharge Exam:  Vitals:   05/10/20 0422 05/10/20 0806  BP: 126/63 (!) 147/65  Pulse: 68 67  Resp: 18 18  Temp: 97.9 F (36.6 C) 98 F (36.7 C)  SpO2: 93% 95%    General: WN WF who is alert and generally healthy appearing.  Lungs: Clear to auscultation and symmetric breath sounds. Heart:   RRR. No murmur or rub. Chest:  Right breast wounds look good. Left mastectomy wound looks good. Drains - 1/2 - 50/30 cc  Discharge Medications:   Allergies as of 05/10/2020      Reactions   Penicillins Other (See Comments)   whelps, ,skin hot to touch      Medication List    TAKE these medications   acetaminophen 500 MG tablet Commonly known as: TYLENOL Take 500 mg by mouth every 6 (six) hours as needed.   albuterol 108 (90 Base) MCG/ACT inhaler Commonly known as: VENTOLIN HFA INHALE 2 PUFFS BY MOUTH EVERY 6 HOURS AS NEEDED What changed: reasons to take this   amLODipine 2.5 MG tablet Commonly known as: NORVASC Take 1 tablet (2.5 mg total) by mouth daily. What changed: when to take this   atorvastatin 40 MG tablet Commonly known as: LIPITOR Take 1 tablet (40 mg total) by mouth daily. What changed: when to take this   azelastine 0.1 % nasal spray Commonly known as: ASTELIN Place 2 sprays into both nostrils 2 (two) times daily. What changed:   when to take this  reasons to take this   citalopram 20 MG tablet Commonly known as: CELEXA Take 1 tablet (20 mg total) by mouth daily. What changed: when to take this   esomeprazole 40 MG capsule Commonly known as: NexIUM TAKE 1 CAPSULE BY MOUTH TWICE A DAY BEFORE A MEAL What changed:   how much to take  how to take this  when to take  this  additional instructions   HYDROcodone-acetaminophen 5-325 MG tablet Commonly known as: NORCO/VICODIN Take 1 tablet by mouth every 6 (six) hours as needed for moderate pain.       Disposition: Discharge disposition: 01-Home or Self Care       Discharge Instructions    Diet - low sodium heart healthy   Complete by: As directed    Increase activity slowly   Complete by: As directed          Signed: Alphonsa Overall, M.D., [facility] Surgery Office:  [phone]  05/10/2020, 8:22 AM

## 2020-05-11 ENCOUNTER — Encounter: Payer: Self-pay | Admitting: Licensed Clinical Social Worker

## 2020-05-11 NOTE — Progress Notes (Signed)
[name] Progress Note  Clinical Education officer, museum contacted patient by phone to check on coping post-surgery. Patient had left mastectomy and right lumpectomy on 05/09/20. She reports to be doing well today. She is trying to focus on recovery and healing while continuing to process the changes. One issue with short-term disability questioning why she was out prior to surgery.  She has been receiving significant support and love from her husband, sons, sister, and even work and is focusing on that rather than stressors.  CSW will follow-up by phone in 2-3 weeks to continue offering adjustment and coping support.    [name] [name] , LCSW

## 2020-05-12 LAB — SURGICAL PATHOLOGY

## 2020-05-16 ENCOUNTER — Telehealth: Payer: Self-pay | Admitting: *Deleted

## 2020-05-16 ENCOUNTER — Encounter: Payer: Self-pay | Admitting: *Deleted

## 2020-05-16 NOTE — Telephone Encounter (Signed)
Received order for oncotype testing. Requisition faxed to pathology and [facility] °

## 2020-05-17 NOTE — Progress Notes (Signed)
Patient Care Team: [name], MD as PCP - General (Family Medicine) Mauro Kaufmann, RN as Oncology Nurse Navigator Rockwell Germany, RN as Oncology Nurse Navigator Nicholas Lose, MD as Consulting Physician (Hematology and Oncology) Kyung Rudd, MD as Consulting Physician (Radiation Oncology) Alphonsa Overall, MD as Consulting Physician (General Surgery)  DIAGNOSIS:    ICD-10-CM   1. Bilateral malignant neoplasm of breast in female, unspecified estrogen receptor status, unspecified site of breast ([place])  C50.911    C50.912     SUMMARY OF ONCOLOGIC HISTORY: Oncology History  Bilateral breast cancer ([place])  03/15/2020 Initial Diagnosis   Right breast, 0.9cm right breast mass at the 12 o'clock position, and no axillary adenopathy. grade 1 invasive ductal carcinoma with high grade DCIS, HER-2 equivocal by IHC, negative by FISH, ER+ 100%, PR+ 100%, Ki67 2% T1BN0 stage Ia left breast: 2.5cm mass in the left breast at the 1 o'clock position with likely skin involvement in the nipple-areola complex, Invasive lobular carcinoma, grade 2, HER-2 negative (1+), ER+ 80%, PR+ 50%, Ki67 2%. T4N0 stage IIIb   05/09/2020 Surgery   Right lumpectomy and left mastectomy [name]): Right breast: invasive and in situ ductal carcinoma, 1.0cm, clear margins, 2 right axillary lymph nodes negative for carcinoma Left breast: invasive and in situ lobular carcinoma, 6.0cm, clear margins, 1/2 left axillary lymph nodes with isolated tumor cells.   05/18/2020 Cancer Staging   Staging form: Breast, AJCC 8th Edition - Pathologic stage from 05/18/2020: Stage IIIA (pT4, pN0(mol+), cM0, G2, ER+, PR+, HER2-) - Signed by Nicholas Lose, MD on 05/18/2020     CHIEF COMPLIANT: Follow-up s/p right lumpectomy and left mastectomy  INTERVAL HISTORY: [name] [name] is a 64 y.o. with above-mentioned history of bilateral breast cancer. She underwent a right lumpectomy and left mastectomy with Dr. [name] on 05/09/20 for which  pathology showed in the right breast, invasive and in situ ductal carcinoma, 1.0cm, clear margins, 2 right axillary lymph nodes negative for carcinoma, and in the left breast, invasive and in situ lobular carcinoma, 6.0cm, clear margins, 1/2 left axillary lymph nodes with isolated tumor cells. She presents to the clinic today to discuss the pathology report and further treatment.   ALLERGIES:  is allergic to penicillins.  MEDICATIONS:  Current Outpatient Medications  Medication Sig Dispense Refill  . acetaminophen (TYLENOL) 500 MG tablet Take 500 mg by mouth every 6 (six) hours as needed.    Marland Kitchen albuterol (VENTOLIN HFA) 108 (90 Base) MCG/ACT inhaler INHALE 2 PUFFS BY MOUTH EVERY 6 HOURS AS NEEDED (Patient taking differently: Inhale 2 puffs into the lungs every 6 (six) hours as needed for wheezing or shortness of breath. ) 8.5 g 2  . amLODipine (NORVASC) 2.5 MG tablet Take 1 tablet (2.5 mg total) by mouth daily. (Patient taking differently: Take 2.5 mg by mouth every morning. ) 90 tablet 3  . atorvastatin (LIPITOR) 40 MG tablet Take 1 tablet (40 mg total) by mouth daily. (Patient taking differently: Take 40 mg by mouth every morning. ) 90 tablet 3  . azelastine (ASTELIN) 0.1 % nasal spray Place 2 sprays into both nostrils 2 (two) times daily. (Patient taking differently: Place 2 sprays into both nostrils 2 (two) times daily as needed (congestion/allergies.). ) 30 mL 12  . citalopram (CELEXA) 20 MG tablet Take 1 tablet (20 mg total) by mouth daily. (Patient taking differently: Take 20 mg by mouth every morning. ) 90 tablet 3  . esomeprazole (NEXIUM) 40 MG capsule TAKE 1 CAPSULE BY  MOUTH TWICE A DAY BEFORE A MEAL (Patient taking differently: Take 40 mg by mouth in the morning and at bedtime. TAKE 1 CAPSULE BY MOUTH TWICE A DAY BEFORE A MEAL  >>>>>>CAN ONLY TAKE NEXIUM - BRAND NAME - NO GENERIC<<<<<) 180 capsule 3  . HYDROcodone-acetaminophen (NORCO/VICODIN) 5-325 MG tablet Take 1 tablet by mouth every 6  (six) hours as needed for moderate pain. 15 tablet 0   No current facility-administered medications for this visit.    PHYSICAL EXAMINATION: ECOG PERFORMANCE STATUS: 1 - Symptomatic but completely ambulatory  Vitals:   05/18/20 0852  BP: (!) 143/93  Pulse: 70  Resp: 17  Temp: (!) 97 F (36.1 C)  SpO2: 95%   Filed Weights   05/18/20 0852  Weight: 202 lb 1.6 oz (91.7 kg)    LABORATORY DATA:  I have reviewed the data as listed CMP Latest Ref Rng & Units 05/05/2020 04/06/2020 12/18/2017  Glucose 70 - 99 mg/dL 112(H) 114(H) 85  BUN 8 - 23 mg/dL 7(L) 7(L) 13  Creatinine 0.44 - 1.00 mg/dL 0.59 0.72 0.61  Sodium 135 - 145 mmol/L 141 141 140  Potassium 3.5 - 5.1 mmol/L 3.7 3.8 4.2  Chloride 98 - 111 mmol/L 104 104 100  CO2 22 - 32 mmol/L _0 Calcium 8.9 - 10.3 mg/dL 9.5 9.7 9.9  Total Protein 6.5 - 8.1 g/dL - 7.6 7.4  Total Bilirubin 0.3 - 1.2 mg/dL - 0.6 0.4  Alkaline Phos 38 - 126 U/L - 126 111  AST 15 - 41 U/L - 12(L) 13  ALT 0 - 44 U/L - 13 14    Lab Results  Component Value Date   WBC 9.3 05/05/2020   HGB 15.5 (H) 05/05/2020   HCT 46.9 (H) 05/05/2020   MCV 91.2 05/05/2020   PLT 197 05/05/2020   NEUTROABS 6.4 07/10/2016    ASSESSMENT & PLAN:  Bilateral breast cancer ([place]) Right lumpectomy and left mastectomy [name]): 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 grade 2, HER-2 negative (1+), ER+ 80%, PR+ 50%, Ki67 2%. T4N0 stage IIIA  Pathology counseling: I discussed the final pathology report of the patient provided  a copy of this report. I discussed the margins as well as lymph node surgeries. We also discussed the final staging along with previously performed ER/PR and HER-2/neu  testing.  CT CAP 04/06/2020: Stomach wall thickening?  Gastritis, small pulmonary nodules 4 mm and 3 mm, hepatomegaly with suspicion for portal gastropathy however no frank signs of cirrhosis.  No metastatic disease Bone scan 04/06/2020: No bone metastatic disease.  Treatment plan: 1. Oncotype DX testing to determine if chemotherapy would be of any benefit followed by 2. Adjuvant radiation therapy followed by 3. Adjuvant antiestrogen therapy  Return to clinic based upon Oncotype test result.    No orders of the defined types were placed in this encounter.  The patient has a good understanding of the overall plan. she agrees with it. she will call with any problems that may develop before the next visit here.  Total time spent: 30 mins including face to face time and time spent for planning, charting and coordination of care  Nicholas Lose, MD 05/18/2020  I, Cloyde Reams Dorshimer, am acting as Education administrator  for Dr. Nicholas Lose.  I have reviewed the above documentation for accuracy and completeness, and I agree with the above.

## 2020-05-18 ENCOUNTER — Inpatient Hospital Stay: Payer: BC Managed Care – PPO | Attending: Hematology and Oncology | Admitting: Hematology and Oncology

## 2020-05-18 ENCOUNTER — Other Ambulatory Visit: Payer: Self-pay

## 2020-05-18 DIAGNOSIS — M810 Age-related osteoporosis without current pathological fracture: Secondary | ICD-10-CM | POA: Insufficient documentation

## 2020-05-18 DIAGNOSIS — E785 Hyperlipidemia, unspecified: Secondary | ICD-10-CM | POA: Diagnosis not present

## 2020-05-18 DIAGNOSIS — C50112 Malignant neoplasm of central portion of left female breast: Secondary | ICD-10-CM | POA: Diagnosis not present

## 2020-05-18 DIAGNOSIS — C50911 Malignant neoplasm of unspecified site of right female breast: Secondary | ICD-10-CM

## 2020-05-18 DIAGNOSIS — Z170 Estrogen receptor positive status [ER+]: Secondary | ICD-10-CM | POA: Insufficient documentation

## 2020-05-18 DIAGNOSIS — C50111 Malignant neoplasm of central portion of right female breast: Secondary | ICD-10-CM | POA: Diagnosis not present

## 2020-05-18 DIAGNOSIS — Z9012 Acquired absence of left breast and nipple: Secondary | ICD-10-CM | POA: Insufficient documentation

## 2020-05-18 DIAGNOSIS — C50912 Malignant neoplasm of unspecified site of left female breast: Secondary | ICD-10-CM

## 2020-05-18 DIAGNOSIS — F17210 Nicotine dependence, cigarettes, uncomplicated: Secondary | ICD-10-CM | POA: Diagnosis not present

## 2020-05-18 DIAGNOSIS — Z8509 Personal history of malignant neoplasm of other digestive organs: Secondary | ICD-10-CM | POA: Diagnosis not present

## 2020-05-18 DIAGNOSIS — I341 Nonrheumatic mitral (valve) prolapse: Secondary | ICD-10-CM | POA: Diagnosis not present

## 2020-05-18 DIAGNOSIS — K219 Gastro-esophageal reflux disease without esophagitis: Secondary | ICD-10-CM | POA: Diagnosis not present

## 2020-05-18 DIAGNOSIS — F419 Anxiety disorder, unspecified: Secondary | ICD-10-CM | POA: Insufficient documentation

## 2020-05-18 NOTE — Assessment & Plan Note (Signed)
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 grade 2, HER-2 negative (1+), ER+ 80%, PR+ 50%, Ki67 2%. T4N0 stage IIIA  Pathology counseling: I discussed the final pathology report of the patient provided  a copy of this report. I discussed the margins as well as lymph node surgeries. We also discussed the final staging along with previously performed ER/PR and HER-2/neu testing.  CT CAP 04/06/2020: Stomach wall thickening?  Gastritis, small pulmonary nodules 4 mm and 3 mm, hepatomegaly with suspicion for portal gastropathy however no frank signs of cirrhosis.  No metastatic disease Bone scan 04/06/2020: No bone metastatic disease.  Treatment plan: 1. Oncotype DX testing to determine if chemotherapy would be of any benefit followed by 2. Adjuvant radiation therapy followed by 3. Adjuvant antiestrogen therapy  Return to clinic based upon Oncotype test result.

## 2020-05-22 ENCOUNTER — Telehealth: Payer: Self-pay | Admitting: Hematology and Oncology

## 2020-05-22 NOTE — Telephone Encounter (Signed)
No 11/4 los, no changes made to pt schedule

## 2020-05-29 ENCOUNTER — Ambulatory Visit: Payer: BC Managed Care – PPO | Attending: Surgery | Admitting: Physical Therapy

## 2020-05-29 ENCOUNTER — Other Ambulatory Visit: Payer: Self-pay

## 2020-05-29 ENCOUNTER — Encounter: Payer: Self-pay | Admitting: Physical Therapy

## 2020-05-29 DIAGNOSIS — C50912 Malignant neoplasm of unspecified site of left female breast: Secondary | ICD-10-CM | POA: Insufficient documentation

## 2020-05-29 DIAGNOSIS — C50911 Malignant neoplasm of unspecified site of right female breast: Secondary | ICD-10-CM | POA: Insufficient documentation

## 2020-05-29 DIAGNOSIS — Z483 Aftercare following surgery for neoplasm: Secondary | ICD-10-CM | POA: Diagnosis not present

## 2020-05-29 DIAGNOSIS — R293 Abnormal posture: Secondary | ICD-10-CM | POA: Diagnosis not present

## 2020-05-29 DIAGNOSIS — M6281 Muscle weakness (generalized): Secondary | ICD-10-CM | POA: Diagnosis not present

## 2020-05-29 DIAGNOSIS — M25612 Stiffness of left shoulder, not elsewhere classified: Secondary | ICD-10-CM | POA: Insufficient documentation

## 2020-05-29 DIAGNOSIS — R600 Localized edema: Secondary | ICD-10-CM

## 2020-05-29 DIAGNOSIS — M25611 Stiffness of right shoulder, not elsewhere classified: Secondary | ICD-10-CM | POA: Insufficient documentation

## 2020-05-29 NOTE — Therapy (Signed)
[place], [place], [address] Phone: [phone]   Fax:  [phone]  Physical Therapy Treatment  Patient Details  Name: [name] [name] MRN: [id number] Date of Birth: 1956/07/06 Referring Provider (PT): [name]   Encounter Date: 05/29/2020   PT End of Session - 05/29/20 0855    Visit Number 2    Number of Visits 10    Date for PT Re-Evaluation 07/03/20    PT Start Time 0804    PT Stop Time 0850    PT Time Calculation (min) 46 min    Behavior During Therapy [facility] for tasks assessed/performed           Past Medical History:  Diagnosis Date  . Abnormal glucose   . Anxiety    05/05/20  . Arthritis   . Breast cancer ([place]) 03/2020  . Esophageal stricture    Dr.Dora Olevia Perches  . GERD (gastroesophageal reflux disease)   . History of colonic polyps   . Hyperlipidemia   . Hypertension   . MVP (mitral valve prolapse)    history of   . Osteoporosis   . Tobacco abuse    tried Chantix- caused agression    Past Surgical History:  Procedure Laterality Date  . APPENDECTOMY  2008  . AXILLARY SENTINEL NODE BIOPSY Right 05/09/2020   Procedure: RIGHT AXILLARY SENTINEL NODE BIOPSY;  Surgeon: Alphonsa Overall, MD;  Location: [facility];  Service: General;  Laterality: Right;  . BREAST BIOPSY  2000  . BREAST BIOPSY Bilateral 2021  . BREAST LUMPECTOMY WITH RADIOACTIVE SEED LOCALIZATION Right 05/09/2020   Procedure: RIGHT BREAST LUMPECTOMY WITH RADIOACTIVE SEED;  Surgeon: Alphonsa Overall, MD;  Location: [facility];  Service: General;  Laterality: Right;  . COLONOSCOPY    . ESOPHAGOGASTRODUODENOSCOPY    . MASTECTOMY, PARTIAL Left 05/09/2020   Procedure: LEFT MASECTOMY;  Surgeon: Alphonsa Overall, MD;  Location: [facility];  Service: General;  Laterality: Left;  . SENTINEL NODE BIOPSY Left 05/09/2020   Procedure: LEFT AXILLARY SENTINEL NODE BIOPSY;  Surgeon: Alphonsa Overall, MD;  Location: [facility];  Service: General;  Laterality: Left;  .  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 vocal cord polyps removed     [facility], Dr Erik Obey    There were no vitals filed for this visit.   Subjective Assessment - 05/29/20 0806    Pertinent History bilateral breast cancer, biopsy on 03/15/20 R breast DCIS ER+ PR+, L breast cancer invasive mammary carcinoma ER+ PR+, had L mastectomy and R lumpectomy SLNB bilaterally 05/09/20 - (2 nodes removed bilaterally but 1 node positive on R), plan is to undergo adjuvant radiation bilaterally and antiestrogen therapy, mitral valve prolapse    Patient Stated Goals to gain info from providers    Currently in Pain? No/denies    Pain Score 0-No pain              [facility] PT Assessment - 05/29/20 0001      Observation/Other Assessments   Observations left mastectomy scar is healing well, some minimal edema inferior to scar and along left lateral trunk, right lumpectomy and SLNB scar healing well , no edema noted on R      AROM   Right Shoulder Flexion 160 Degrees    Right Shoulder ABduction 140 Degrees    Right Shoulder Internal Rotation 57 Degrees    Right Shoulder External Rotation 85 Degrees    Left Shoulder Flexion 144 Degrees  Left Shoulder ABduction 111 Degrees    Left Shoulder Internal Rotation 58 Degrees    Left Shoulder External Rotation 80 Degrees             LYMPHEDEMA/ONCOLOGY QUESTIONNAIRE - 05/29/20 0001      Right Upper Extremity Lymphedema   15 cm Proximal to Olecranon Process 31.4 cm    Olecranon Process 25.9 cm    15 cm Proximal to Ulnar Styloid Process 24.9 cm    Just Proximal to Ulnar Styloid Process 15.5 cm    Across Hand at [employer] 18.1 cm    At [place] of 2nd Digit 6.4 cm      Left Upper Extremity Lymphedema   15 cm Proximal to Olecranon Process 33.7 cm    Olecranon Process 27 cm    15 cm Proximal to Ulnar Styloid Process 24.2 cm    Just Proximal to Ulnar Styloid Process 16.5 cm    Across Hand at [employer] 18 cm    At [place] of 2nd Digit 6.1  cm                      [facility] Adult PT Treatment/Exercise - 05/29/20 0001      Manual Therapy   Manual Therapy Other (comment)    Other Manual Therapy cut 1/2 grey foam and placed in TG soft to place over left chest in area of skin sensitivity                  PT Education - 05/29/20 0851    Education Details lymphedema risk reduction practices, importance of not doing too much too soon, using chip pack to decrease sensitivty    Person(s) Educated Patient    Methods Explanation;Handout    Comprehension Verbalized understanding               PT Long Term Goals - 05/29/20 9509      PT LONG TERM GOAL #1   Title Pt will return to baseline ROM measurements to allow pt to return to PLOF.    Time 8    Period Weeks    Status On-going    Target Date 07/03/20      PT LONG TERM GOAL #2   Title Pt will demonstate 165 degrees of bilateral shoulder flexion to allow her to reach overhead    Baseline R 160, L 144    Time 5    Period Weeks    Status New    Target Date 07/03/20      PT LONG TERM GOAL #3   Title Pt will demonstrate 165 degrees of bilateral shoulder abduction to allow pt to reach out to the side    Baseline R 140 L 111    Time 5    Period Weeks    Status New    Target Date 07/03/20      PT LONG TERM GOAL #4   Title Pt will report no increased skin sensitivty at anterior chest to allow improved comfort    Time 5    Period Weeks    Status New    Target Date 07/03/20      PT LONG TERM GOAL #5   Title Pt will be independent in a home exercise program for long term stretching and strengthening    Time 5    Period Weeks    Status New    Target Date 07/03/20  Plan - 05/29/20 0856    Clinical Impression Statement Pt returns for post op follow up appointment. She ended up having a L mastectomy and SLNB with 1 node positive and a R lumpectomy and SLNB with no nodes positive. She reports she has more tightness on the left  side but overall is pleased with how well she is doing. She does have increased skin sensitivity across left chest so cut 1/2 in grey foam for pt to wear across her chest to help decrease sensitivity. Pt's scars are healing well bilaterally but she has lost ROM in bilateral shoulders with L more limited than R. She is very eager to get back to leaf blowing, raking and being active. Educated pt about importance of allowing herself to heal first to help decrease risk of lymphedema. Pt would benefit from skilled PT services to improve bilateral shoulder ROM, decrease skin sensitivity, improve scar mobility and progress pt towards independence with a home exercise program.    Examination-Activity Limitations Lift;Reach Overhead    Rehab Potential Good    PT Frequency 2x / week    PT Duration --   5 weeks   PT Treatment/Interventions ADLs/Self Care Home Management;Therapeutic exercise;Patient/family education;Manual lymph drainage;Manual techniques;Taping;Passive range of motion;Scar mobilization    PT Next Visit Plan continue to monitor bilat UE circumferences, begin PROM to bilateral shoulders, can go above shoulder height on L after 11/17, pulleys, ball, MLD to L lateral trunk    PT Home Exercise Plan post op breast exercises    Consulted and Agree with Plan of Care Patient           Patient will benefit from skilled therapeutic intervention in order to improve the following deficits and impairments:  Postural dysfunction, Decreased knowledge of precautions, Impaired flexibility, Impaired UE functional use, Increased fascial restricitons, Decreased strength, Decreased range of motion, Decreased scar mobility, Increased edema  Visit Diagnosis: Stiffness of left shoulder, not elsewhere classified  Stiffness of right shoulder, not elsewhere classified  Aftercare following surgery for neoplasm  Localized edema  Abnormal posture  Malignant neoplasm of right female breast, unspecified estrogen  receptor status, unspecified site of breast ([place])  Malignant neoplasm of left female breast, unspecified estrogen receptor status, unspecified site of breast [facility])     Problem List Patient Active Problem List   Diagnosis Date Noted  . Bilateral breast cancer ([place]) 03/24/2020  . Hypertension 10/20/2017  . History of squamous cell carcinoma of skin 09/12/2016  . 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 OSA (obstructive sleep apnea) 01/18/2013  . Adjustment disorder with mixed anxiety and depressed mood 08/17/2012  . Hot flashes 03/23/2012  . GERD 08/28/2009  . Abnormal glucose 08/28/2009  . PARESTHESIA 11/11/2008  . History of colonic polyps 11/11/2008  . Benign paroxysmal positional vertigo 10/25/2008  . Hyperlipemia 09/30/2008  . TOBACCO ABUSE 09/30/2008  . Osteoporosis 09/30/2008    Allyson Sabal [facility] 05/29/2020, 9:01 AM  [place], [place], [address] Phone: [phone]   Fax:  [phone]  Name: MYRL BYNUM MRN: [id number] Date of Birth: [birth year]  Manus Gunning, PT 05/29/20 9:01 AM

## 2020-05-30 ENCOUNTER — Encounter: Payer: Self-pay | Admitting: Licensed Clinical Social Worker

## 2020-05-30 ENCOUNTER — Encounter: Payer: Self-pay | Admitting: *Deleted

## 2020-05-30 NOTE — Progress Notes (Signed)
[place] CSW Progress Note  Clinical Education officer, museum contacted patient by phone to provide ongoing support. Patient is doing fairly well right now a few weeks out from surgery. There have been some changes in what she expected with needing radiation to both sides and having oncotype pending but she is taking it step by step and focusing on the here & now. Hardest thing has been not doing activities that she would like to do (leafblowing, vacuuming, etc) around the house and accepting help. CSW allowed space for patient to process this and discussed how to change thinking from "I'm doing nothing" to what she is doing such as allowing herself to heal, connecting with nature, finding new ways to accept love.  Patient also processed through different events in her life and childhood with her dad and mom that contributed to her being independent and driven to succeed.   CSW will follow-up by phone in 3 weeks. Patient will call if she has an appt at the [facility] and will plan to meet CSW in-person if and when that occurs.   Christeen Douglas , LCSW

## 2020-05-31 DIAGNOSIS — C50911 Malignant neoplasm of unspecified site of right female breast: Secondary | ICD-10-CM | POA: Diagnosis not present

## 2020-05-31 DIAGNOSIS — Z170 Estrogen receptor positive status [ER+]: Secondary | ICD-10-CM | POA: Diagnosis not present

## 2020-06-02 ENCOUNTER — Telehealth: Payer: Self-pay | Admitting: *Deleted

## 2020-06-02 ENCOUNTER — Encounter: Payer: Self-pay | Admitting: *Deleted

## 2020-06-02 DIAGNOSIS — Z170 Estrogen receptor positive status [ER+]: Secondary | ICD-10-CM

## 2020-06-02 DIAGNOSIS — C50111 Malignant neoplasm of central portion of right female breast: Secondary | ICD-10-CM

## 2020-06-02 NOTE — Telephone Encounter (Signed)
Received oncotype score of 12. Physician team notified. Called pt and discuss chemo not recommended and next step is XRT. Received verbal understanding. No further needs voiced. Referral placed for appt with Dr. Lisbeth Renshaw.

## 2020-06-06 ENCOUNTER — Encounter: Payer: Self-pay | Admitting: Physical Therapy

## 2020-06-06 ENCOUNTER — Other Ambulatory Visit: Payer: Self-pay

## 2020-06-06 ENCOUNTER — Ambulatory Visit: Payer: BC Managed Care – PPO | Admitting: Physical Therapy

## 2020-06-06 DIAGNOSIS — M25612 Stiffness of left shoulder, not elsewhere classified: Secondary | ICD-10-CM | POA: Diagnosis not present

## 2020-06-06 DIAGNOSIS — Z483 Aftercare following surgery for neoplasm: Secondary | ICD-10-CM | POA: Diagnosis not present

## 2020-06-06 DIAGNOSIS — M6281 Muscle weakness (generalized): Secondary | ICD-10-CM | POA: Diagnosis not present

## 2020-06-06 DIAGNOSIS — R600 Localized edema: Secondary | ICD-10-CM | POA: Diagnosis not present

## 2020-06-06 DIAGNOSIS — C50912 Malignant neoplasm of unspecified site of left female breast: Secondary | ICD-10-CM | POA: Diagnosis not present

## 2020-06-06 DIAGNOSIS — M25611 Stiffness of right shoulder, not elsewhere classified: Secondary | ICD-10-CM

## 2020-06-06 DIAGNOSIS — C50911 Malignant neoplasm of unspecified site of right female breast: Secondary | ICD-10-CM | POA: Diagnosis not present

## 2020-06-06 DIAGNOSIS — R293 Abnormal posture: Secondary | ICD-10-CM | POA: Diagnosis not present

## 2020-06-06 NOTE — Therapy (Signed)
[place], [place], [address] Phone: [phone]   Fax:  [phone]  Physical Therapy Treatment  Patient Details  Name: [name] [name] MRN: [id number] Date of Birth: 1956/05/12 Referring Provider (PT): [name]   Encounter Date: 06/06/2020   PT End of Session - 06/06/20 1718    Visit Number 3    Number of Visits 10    Date for PT Re-Evaluation 07/03/20    PT Start Time 1400    PT Stop Time 1445    PT Time Calculation (min) 45 min    Activity Tolerance Patient tolerated treatment well    Behavior During Therapy [facility] for tasks assessed/performed           Past Medical History:  Diagnosis Date  . Abnormal glucose   . Anxiety    05/05/20  . Arthritis   . Breast cancer ([place]) 03/2020  . Esophageal stricture    Dr.Dora Olevia Perches  . GERD (gastroesophageal reflux disease)   . History of colonic polyps   . Hyperlipidemia   . Hypertension   . MVP (mitral valve prolapse)    history of   . Osteoporosis   . Tobacco abuse    tried Chantix- caused agression    Past Surgical History:  Procedure Laterality Date  . APPENDECTOMY  2008  . AXILLARY SENTINEL NODE BIOPSY Right 05/09/2020   Procedure: RIGHT AXILLARY SENTINEL NODE BIOPSY;  Surgeon: Alphonsa Overall, MD;  Location: [place];  Service: General;  Laterality: Right;  . BREAST BIOPSY  2000  . BREAST BIOPSY Bilateral 2021  . BREAST LUMPECTOMY WITH RADIOACTIVE SEED LOCALIZATION Right 05/09/2020   Procedure: RIGHT BREAST LUMPECTOMY WITH RADIOACTIVE SEED;  Surgeon: Alphonsa Overall, MD;  Location: [place];  Service: General;  Laterality: Right;  . COLONOSCOPY    . ESOPHAGOGASTRODUODENOSCOPY    . MASTECTOMY, PARTIAL Left 05/09/2020   Procedure: LEFT MASECTOMY;  Surgeon: Alphonsa Overall, MD;  Location: [place];  Service: General;  Laterality: Left;  . SENTINEL NODE BIOPSY Left 05/09/2020   Procedure: LEFT AXILLARY SENTINEL NODE BIOPSY;  Surgeon: Alphonsa Overall, MD;   Location: [place];  Service: General;  Laterality: Left;  . 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 vocal cord polyps removed     benigh, Dr Erik Obey    There were no vitals filed for this visit.   Subjective Assessment - 06/06/20 1407    Subjective I think I am doing well.    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 No/denies              [facility] PT Assessment - 06/06/20 0001      AROM   Right Shoulder Flexion 170 Degrees    Right Shoulder ABduction 170 Degrees    Left Shoulder Flexion 160 Degrees    Left Shoulder ABduction 125 Degrees                         [facility] Adult PT Treatment/Exercise - 06/06/20 0001      Self-Care   Self-Care Other  Self-Care Comments    Other Self-Care Comments  gave pt information about knitted knocker, information about where to get compression/mastectomy garments and also gave her a piece of 1/2 inch foam to wear at [place] chest inside current sports bra       Exercises   Exercises Shoulder      Shoulder Exercises: Supine   Protraction AROM;Left;5 reps    Flexion AAROM;Both;5 reps   with dowel    ABduction AAROM;Both;5 reps   with dowel      Manual Therapy   Manual Therapy Passive ROM    Manual therapy comments gentle soft tissue work to [employer] area above incsion on the left     Passive ROM to both shoulders into end range                        PT Long Term Goals - 05/29/20 0852      PT LONG TERM GOAL #1   Title Pt will return to baseline ROM measurements to allow pt to return to PLOF.    Time 8    Period Weeks    Status  On-going    Target Date 07/03/20      PT LONG TERM GOAL #2   Title Pt will demonstate 165 degrees of bilateral shoulder flexion to allow her to reach overhead    Baseline R 160, L 144    Time 5    Period Weeks    Status New    Target Date 07/03/20      PT LONG TERM GOAL #3   Title Pt will demonstrate 165 degrees of bilateral shoulder abduction to allow pt to reach out to the side    Baseline R 140 L 111    Time 5    Period Weeks    Status New    Target Date 07/03/20      PT LONG TERM GOAL #4   Title Pt will report no increased skin sensitivty at anterior chest to allow improved comfort    Time 5    Period Weeks    Status New    Target Date 07/03/20      PT LONG TERM GOAL #5   Title Pt will be independent in a home exercise program for long term stretching and strengthening    Time 5    Period Weeks    Status New    Target Date 07/03/20                 Plan - 06/06/20 1719    Clinical Impression Statement Pt has improved shoulder ROM but still has tightness especially on the left.  Incisions are healing, but she has fullness in left lateral chest. Added foam pad to this area today and upgraded to dowel exercises Talked to pt about compression bra    Examination-Activity Limitations Lift;Reach Overhead    Rehab Potential Good    PT Frequency 2x / week    PT Treatment/Interventions ADLs/Self Care Home Management;Therapeutic exercise;Patient/family education;Manual lymph drainage;Manual techniques;Taping;Passive range of motion;Scar mobilization    PT Next Visit Plan progress to pulley and wall exercises, add scapular strengthening. continue to monitor bilat UE circumferences, begin PROM to bilateral shoulders, can go above shoulder height on L after 11/17, pulleys, ball, MLD to L lateral trunk    PT Home Exercise Plan post op breast exercises., supine dowel exercises    Consulted and Agree with Plan of Care Patient  Patient will benefit from skilled  therapeutic intervention in order to improve the following deficits and impairments:  Postural dysfunction, Decreased knowledge of precautions, Impaired flexibility, Impaired UE functional use, Increased fascial restricitons, Decreased strength, Decreased range of motion, Decreased scar mobility, Increased edema  Visit Diagnosis: Stiffness of left shoulder, not elsewhere classified  Stiffness of right shoulder, not elsewhere classified  Aftercare following surgery for neoplasm  Localized edema  Abnormal posture  Muscle weakness (generalized)     Problem List Patient Active Problem List   Diagnosis Date Noted  . Bilateral breast cancer ([place]) 03/24/2020  . Hypertension 10/20/2017  . History of squamous cell carcinoma of skin 09/12/2016  . 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 OSA (obstructive sleep apnea) 01/18/2013  . Adjustment disorder with mixed anxiety and depressed mood 08/17/2012  . Hot flashes 03/23/2012  . GERD 08/28/2009  . Abnormal glucose 08/28/2009  . PARESTHESIA 11/11/2008  . History of colonic polyps 11/11/2008  . Benign paroxysmal positional vertigo 10/25/2008  . Hyperlipemia 09/30/2008  . TOBACCO ABUSE 09/30/2008  . Osteoporosis 09/30/2008   Donato Heinz. Owens Shark PT  [place] 06/06/2020, [place], [place], [address] Phone: [phone]   Fax:  [phone]  Name: [name] [name] MRN: [id number] Date of Birth: 05-25-1956

## 2020-06-06 NOTE — Patient Instructions (Signed)
First of all, check with your insurance company to see if provider is in [place] (for wigs and compression sleeves / gloves/gauntlets )  [place], [place] [address] [phone]  Will file some insurances --- call for appointment   Second to [facility] (for mastectomy prosthetics and garments) [place], [place] [address] [phone] Will file some insurances --- call for appointment  [facility]  [address]  [place], [place] [address] [phone] Lower extremity garments  [facility] [place], [place]  [address] [facility] (for compression garments, especilly for lower extremities) [address], [place], [place]  [address] [phone] Call for appointment    [employer] ,certified fitter [place]  [phone]  [employer] (for mastectomy supplies and garments) [place]. [place], [place] [address] [phone]  Other Resources: National Lymphedema Network:  www.lymphnet.org www.Klosetraining.com for patient articles and self manual lymph drainage information www.lymphedemablog.com has informative articles.  [employer].com www.lymphedemaproducts.com Www.brightlifedirect.com   SHOULDER: Flexion - Supine (Cane)        Cancer Rehab [phone]    Hold cane in both hands. Raise arms up overhead. Do not allow back to arch. Hold _5__ seconds. Do __5-10__ times; __1-2__ times a day.   SELF ASSISTED WITH OBJECT: Shoulder Abduction / Adduction - Supine    Hold cane with both hands. Move both arms from side to side, keep elbows straight.  Hold when stretch felt for __5__ seconds. Repeat __5-10__ times; __1-2__ times a day. Once this becomes easier progress to third picture bringing affected arm towards ear by staying out to side. Same hold for _5_seconds. Repeat  _5-10_  times, _1-2_ times/day.  Shoulder Blade Stretch    Clasp fingers behind head with elbows touching in front of face. Pull elbows back while pressing shoulder blades together. Relax and hold as tolerated, can place pillow under elbow here for comfort as needed and to allow for prolonged stretch.  Repeat __5__ times. Do __1-2__ sessions per day.

## 2020-06-07 ENCOUNTER — Ambulatory Visit: Payer: BC Managed Care – PPO

## 2020-06-07 DIAGNOSIS — R600 Localized edema: Secondary | ICD-10-CM

## 2020-06-07 DIAGNOSIS — C50912 Malignant neoplasm of unspecified site of left female breast: Secondary | ICD-10-CM | POA: Diagnosis not present

## 2020-06-07 DIAGNOSIS — R293 Abnormal posture: Secondary | ICD-10-CM

## 2020-06-07 DIAGNOSIS — C50911 Malignant neoplasm of unspecified site of right female breast: Secondary | ICD-10-CM

## 2020-06-07 DIAGNOSIS — Z483 Aftercare following surgery for neoplasm: Secondary | ICD-10-CM

## 2020-06-07 DIAGNOSIS — M25612 Stiffness of left shoulder, not elsewhere classified: Secondary | ICD-10-CM | POA: Diagnosis not present

## 2020-06-07 DIAGNOSIS — M6281 Muscle weakness (generalized): Secondary | ICD-10-CM | POA: Diagnosis not present

## 2020-06-07 DIAGNOSIS — M25611 Stiffness of right shoulder, not elsewhere classified: Secondary | ICD-10-CM | POA: Diagnosis not present

## 2020-06-07 NOTE — Therapy (Signed)
[place], [place], [address] Phone: [phone]   Fax:  [phone]  Physical Therapy Treatment  Patient Details  Name: BERT GIVANS MRN: [id number] Date of Birth: 1955-12-12 Referring Provider (PT): [name]   Encounter Date: 06/07/2020   PT End of Session - 06/07/20 1121    Visit Number 4    Number of Visits 10    Date for PT Re-Evaluation 07/03/20    PT Start Time 1010    PT Stop Time 1112    PT Time Calculation (min) 62 min    Activity Tolerance Patient tolerated treatment well    Behavior During Therapy [facility] for tasks assessed/performed           Past Medical History:  Diagnosis Date  . Abnormal glucose   . Anxiety    05/05/20  . Arthritis   . Breast cancer ([place]) 03/2020  . Esophageal stricture    Dr.Dora Olevia Perches  . GERD (gastroesophageal reflux disease)   . History of colonic polyps   . Hyperlipidemia   . Hypertension   . MVP (mitral valve prolapse)    history of   . Osteoporosis   . Tobacco abuse    tried Chantix- caused agression    Past Surgical History:  Procedure Laterality Date  . APPENDECTOMY  2008  . AXILLARY SENTINEL NODE BIOPSY Right 05/09/2020   Procedure: RIGHT AXILLARY SENTINEL NODE BIOPSY;  Surgeon: Alphonsa Overall, MD;  Location: [place];  Service: General;  Laterality: Right;  . BREAST BIOPSY  2000  . BREAST BIOPSY Bilateral 2021  . BREAST LUMPECTOMY WITH RADIOACTIVE SEED LOCALIZATION Right 05/09/2020   Procedure: RIGHT BREAST LUMPECTOMY WITH RADIOACTIVE SEED;  Surgeon: Alphonsa Overall, MD;  Location: [place];  Service: General;  Laterality: Right;  . COLONOSCOPY    . ESOPHAGOGASTRODUODENOSCOPY    . MASTECTOMY, PARTIAL Left 05/09/2020   Procedure: LEFT MASECTOMY;  Surgeon: Alphonsa Overall, MD;  Location: [place];  Service: General;  Laterality: Left;  . SENTINEL NODE BIOPSY Left 05/09/2020   Procedure: LEFT AXILLARY SENTINEL NODE BIOPSY;  Surgeon: Alphonsa Overall, MD;   Location: [place];  Service: General;  Laterality: Left;  . 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 vocal cord polyps removed     benigh, Dr Erik Obey    There were no vitals filed for this visit.   Subjective Assessment - 06/07/20 1019    Subjective I just got the new exercises yesterday so I haven't got to ttry them yet but I do think my arms are getting better.    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 No/denies                             [facility] Adult PT Treatment/Exercise - 06/07/20 0001      Shoulder Exercises: Standing   Flexion AROM;Both;5 reps   in front of mirror for visual cuing to decr scap compensatio   ABduction AROM;Both;5 reps   in front of mirror for visual cuing to decr scap compensatio  Shoulder Exercises: Pulleys   Flexion Other (comment)   4 minutes   Flexion Limitations Max tactile and VCs for correct scapular compensation initially but then pt able to return correct demo    ABduction 3 minutes    ABduction Limitations Pt returned therapist demo and mod tactile and VCs for correct scapular engagment then pt able to return good demo      Shoulder Exercises: Therapy Ball   Flexion Both;10 reps   forward lean into end of stretch   Flexion Limitations Pt returned therapist demo       Shoulder Exercises: Stretch   Wall Stretch - ABduction 5 reps   finger ladder, bil UE's   Wall Stretch - ABduction Limitations VCs to instruct in correct technique with demo, then pt able to perform correctly      Manual Therapy   Manual Therapy Myofascial release;Passive ROM     Myofascial Release To Lt axilla blocking tight pect to allow for increase stretch, then this loosened well by end of stretching and pt did not report discomfort after that     Passive ROM In Supine: to bil shoulders into flexion, abduction and D2 to tolerance, much improvement noted with bil UE's by end of stretching                       PT Long Term Goals - 05/29/20 2878      PT LONG TERM GOAL #1   Title Pt will return to baseline ROM measurements to allow pt to return to PLOF.    Time 8    Period Weeks    Status On-going    Target Date 07/03/20      PT LONG TERM GOAL #2   Title Pt will demonstate 165 degrees of bilateral shoulder flexion to allow her to reach overhead    Baseline R 160, L 144    Time 5    Period Weeks    Status New    Target Date 07/03/20      PT LONG TERM GOAL #3   Title Pt will demonstrate 165 degrees of bilateral shoulder abduction to allow pt to reach out to the side    Baseline R 140 L 111    Time 5    Period Weeks    Status New    Target Date 07/03/20      PT LONG TERM GOAL #4   Title Pt will report no increased skin sensitivty at anterior chest to allow improved comfort    Time 5    Period Weeks    Status New    Target Date 07/03/20      PT LONG TERM GOAL #5   Title Pt will be independent in a home exercise program for long term stretching and strengthening    Time 5    Period Weeks    Status New    Target Date 07/03/20                 Plan - 06/07/20 1121    Clinical Impression Statement At beginning of session spent time working on AA/ROM with pulleys, ball roll up wall, and finger ladder. Initially pt was requiring mod-moax tactile and VCs to decrease Lt scapular compensation and how to correctly engage scapular depression with motions. After multiple attempts pt able to return correct demo and also able to verbalize good understanding of mechanics of movement. Also had good improvement with end AA/ROM with each  activity. Then continued with P/ROM of bil shoulders and though pt requires min-mod VCs to relax throughout, she is able to do so well and her end P/ROM was near full bilaterally by end of session. Pt repoted feeling very pleased with progress thus far and feeling much looser in upper quadrants. Her radiation simulation is 06/22/20.    Examination-Activity Limitations Lift;Reach Overhead    Stability/Clinical Decision Making Stable/Uncomplicated    Rehab Potential Good    PT Frequency 2x / week    PT Duration --   5 weeks   PT Treatment/Interventions ADLs/Self Care Home Management;Therapeutic exercise;Patient/family education;Manual lymph drainage;Manual techniques;Taping;Passive range of motion;Scar mobilization    PT Next Visit Plan Cont pulley and wall exercises assessing scapular engagement, add scapular strengthening. continue to monitor bilat UE circumferences, cont PROM to bilateral shoulders, MLD to L lateral trunk    PT Home Exercise Plan post op breast exercises., supine dowel exercises; end ROM stretching in doorway    Consulted and Agree with Plan of Care Patient           Patient will benefit from skilled therapeutic intervention in order to improve the following deficits and impairments:  Postural dysfunction, Decreased knowledge of precautions, Impaired flexibility, Impaired UE functional use, Increased fascial restricitons, Decreased strength, Decreased range of motion, Decreased scar mobility, Increased edema  Visit Diagnosis: Stiffness of left shoulder, not elsewhere classified  Stiffness of right shoulder, not elsewhere classified  Aftercare following surgery for neoplasm  Localized edema  Abnormal posture  Muscle weakness (generalized)  Malignant neoplasm of right female breast, unspecified estrogen receptor status, unspecified site of breast (HCC)  Malignant neoplasm of left female breast, unspecified estrogen receptor status, unspecified site of breast  [facility])     Problem List Patient Active Problem List   Diagnosis Date Noted  . Bilateral breast cancer ([place]) 03/24/2020  . Hypertension 10/20/2017  . History of squamous cell carcinoma of skin 09/12/2016  . 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 OSA (obstructive sleep apnea) 01/18/2013  . Adjustment disorder with mixed anxiety and depressed mood 08/17/2012  . Hot flashes 03/23/2012  . GERD 08/28/2009  . Abnormal glucose 08/28/2009  . PARESTHESIA 11/11/2008  . History of colonic polyps 11/11/2008  . Benign paroxysmal positional vertigo 10/25/2008  . Hyperlipemia 09/30/2008  . TOBACCO ABUSE 09/30/2008  . Osteoporosis 09/30/2008    Otelia Limes, PTA 06/07/2020, 11:29 AM  [place], [place], [address] Phone: [phone]   Fax:  [phone]  Name: TIMOTHEA BODENHEIMER MRN: [id number] Date of Birth: 1956-02-21

## 2020-06-09 ENCOUNTER — Encounter: Payer: Self-pay | Admitting: Hematology and Oncology

## 2020-06-12 ENCOUNTER — Encounter: Payer: Self-pay | Admitting: *Deleted

## 2020-06-14 ENCOUNTER — Other Ambulatory Visit: Payer: Self-pay

## 2020-06-14 ENCOUNTER — Encounter: Payer: Self-pay | Admitting: Physical Therapy

## 2020-06-14 ENCOUNTER — Ambulatory Visit: Payer: BC Managed Care – PPO | Attending: Surgery | Admitting: Physical Therapy

## 2020-06-14 DIAGNOSIS — C50911 Malignant neoplasm of unspecified site of right female breast: Secondary | ICD-10-CM | POA: Insufficient documentation

## 2020-06-14 DIAGNOSIS — C50912 Malignant neoplasm of unspecified site of left female breast: Secondary | ICD-10-CM | POA: Insufficient documentation

## 2020-06-14 DIAGNOSIS — Z483 Aftercare following surgery for neoplasm: Secondary | ICD-10-CM | POA: Diagnosis not present

## 2020-06-14 DIAGNOSIS — M6281 Muscle weakness (generalized): Secondary | ICD-10-CM | POA: Diagnosis not present

## 2020-06-14 DIAGNOSIS — M25611 Stiffness of right shoulder, not elsewhere classified: Secondary | ICD-10-CM | POA: Diagnosis not present

## 2020-06-14 DIAGNOSIS — M25612 Stiffness of left shoulder, not elsewhere classified: Secondary | ICD-10-CM | POA: Insufficient documentation

## 2020-06-14 DIAGNOSIS — R600 Localized edema: Secondary | ICD-10-CM | POA: Insufficient documentation

## 2020-06-14 DIAGNOSIS — R293 Abnormal posture: Secondary | ICD-10-CM | POA: Insufficient documentation

## 2020-06-14 NOTE — Therapy (Signed)
[place], [place], [address] Phone: [phone]   Fax:  [phone]  Physical Therapy Treatment  Patient Details  Name: [name] [name] MRN: [id number] Date of Birth: January 07, 1956 Referring Provider (PT): [name]   Encounter Date: 06/14/2020   PT End of Session - 06/14/20 1212    Visit Number 5    Number of Visits 10    Date for PT Re-Evaluation 07/03/20    PT Start Time 1103    PT Stop Time 1200    PT Time Calculation (min) 57 min    Activity Tolerance Patient tolerated treatment well    Behavior During Therapy [facility] for tasks assessed/performed           Past Medical History:  Diagnosis Date   Abnormal glucose    Anxiety    05/05/20   Arthritis    Breast cancer ([place]) 03/2020   Esophageal stricture    Dr.Dora Olevia Perches   GERD (gastroesophageal reflux disease)    History of colonic polyps    Hyperlipidemia    Hypertension    MVP (mitral valve prolapse)    history of    Osteoporosis    Tobacco abuse    tried Chantix- caused agression    Past Surgical History:  Procedure Laterality Date   APPENDECTOMY  2008   AXILLARY SENTINEL NODE BIOPSY Right 05/09/2020   Procedure: RIGHT AXILLARY SENTINEL NODE BIOPSY;  Surgeon: Alphonsa Overall, MD;  Location: [place];  Service: General;  Laterality: Right;   BREAST BIOPSY  2000   BREAST BIOPSY Bilateral 2021   BREAST LUMPECTOMY WITH RADIOACTIVE SEED LOCALIZATION Right 05/09/2020   Procedure: RIGHT BREAST LUMPECTOMY WITH RADIOACTIVE SEED;  Surgeon: Alphonsa Overall, MD;  Location: [place];  Service: General;  Laterality: Right;   COLONOSCOPY     ESOPHAGOGASTRODUODENOSCOPY     MASTECTOMY, PARTIAL Left 05/09/2020   Procedure: LEFT MASECTOMY;  Surgeon: Alphonsa Overall, MD;  Location: [place];  Service: General;  Laterality: Left;   SENTINEL NODE BIOPSY Left 05/09/2020   Procedure: LEFT AXILLARY SENTINEL NODE BIOPSY;  Surgeon: Alphonsa Overall, MD;   Location: [place];  Service: General;  Laterality: Left;   SKIN GRAFT Right    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   Subjective Assessment - 06/14/20 1115    Subjective The exercises have been going well. I have been doing them when I am laying around. I have been able to vacuum and clean my house.    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 No/denies    Pain Score 0-No pain                             [facility] Adult PT Treatment/Exercise - 06/14/20 0001      Manual Therapy   Manual Therapy Myofascial release;Passive ROM;Soft tissue mobilization    Soft tissue mobilization to right pec muscle to release muscle tension    Myofascial Release  to cording extending from left mastectomy scar to left axilla - several cords palpable    Passive ROM In Supine: to bil shoulders into flexion, abduction and D2 to tolerance, much improvement noted with bil UE's by end of stretching                       PT Long Term Goals - 05/29/20 9622      PT LONG TERM GOAL #1   Title Pt will return to baseline ROM measurements to allow pt to return to PLOF.    Time 8    Period Weeks    Status On-going    Target Date 07/03/20      PT LONG TERM GOAL #2   Title Pt will demonstate 165 degrees of bilateral shoulder flexion to allow her to reach overhead    Baseline R 160, L 144    Time 5    Period Weeks    Status New    Target Date 07/03/20      PT LONG TERM GOAL #3   Title Pt will demonstrate 165 degrees of  bilateral shoulder abduction to allow pt to reach out to the side    Baseline R 140 L 111    Time 5    Period Weeks    Status New    Target Date 07/03/20      PT LONG TERM GOAL #4   Title Pt will report no increased skin sensitivty at anterior chest to allow improved comfort    Time 5    Period Weeks    Status New    Target Date 07/03/20      PT LONG TERM GOAL #5   Title Pt will be independent in a home exercise program for long term stretching and strengthening    Time 5    Period Weeks    Status New    Target Date 07/03/20                 Plan - 06/14/20 1214    Clinical Impression Statement Pt's ROM has improved greatly but she is still having tightness in left axilla. Cording was palpable today in L axilla extending down to left mastectomy scar. Focused today on reducing tightness in this area. Continued with AAROM exercises and PROM to bilateral shoulders. Her R shoulder had full PROM so most of the session was focusing on the left shoulder.    PT Frequency 2x / week    PT Duration --   5 weeks   PT Treatment/Interventions ADLs/Self Care Home Management;Therapeutic exercise;Patient/family education;Manual lymph drainage;Manual techniques;Taping;Passive range of motion;Scar mobilization           Patient will benefit from skilled therapeutic intervention in order to improve the following deficits and impairments:  Postural dysfunction, Decreased knowledge of precautions, Impaired flexibility, Impaired UE functional use, Increased fascial restricitons, Decreased strength, Decreased range of motion, Decreased scar mobility, Increased edema  Visit Diagnosis: Stiffness of left shoulder, not elsewhere classified  Stiffness of right shoulder, not elsewhere classified  Aftercare following surgery for neoplasm     Problem List Patient Active Problem List   Diagnosis Date Noted   Bilateral breast cancer ([place]) 03/24/2020   Hypertension 10/20/2017   History of  squamous cell carcinoma of skin 09/12/2016   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 05/23/2016   OSA (obstructive sleep apnea) 01/18/2013   Adjustment disorder with mixed anxiety and depressed mood 08/17/2012   Hot flashes 03/23/2012  GERD 08/28/2009   Abnormal glucose 08/28/2009   PARESTHESIA 11/11/2008   History of colonic polyps 11/11/2008   Benign paroxysmal positional vertigo 10/25/2008   Hyperlipemia 09/30/2008   TOBACCO ABUSE 09/30/2008   Osteoporosis 09/30/2008    Allyson Sabal [facility] 06/14/2020, 12:16 PM  [place], [place], [address] Phone: [phone]   Fax:  [phone]  Name: [name] [name] MRN: [id number] Date of Birth: 1955/09/16  Manus Gunning, PT 06/14/20 12:16 PM

## 2020-06-15 ENCOUNTER — Ambulatory Visit: Payer: BC Managed Care – PPO | Admitting: Physical Therapy

## 2020-06-15 DIAGNOSIS — C50912 Malignant neoplasm of unspecified site of left female breast: Secondary | ICD-10-CM | POA: Diagnosis not present

## 2020-06-15 DIAGNOSIS — Z170 Estrogen receptor positive status [ER+]: Secondary | ICD-10-CM | POA: Diagnosis not present

## 2020-06-16 ENCOUNTER — Encounter: Payer: Self-pay | Admitting: Hematology and Oncology

## 2020-06-19 ENCOUNTER — Encounter: Payer: BC Managed Care – PPO | Admitting: Physical Therapy

## 2020-06-19 ENCOUNTER — Ambulatory Visit: Payer: BC Managed Care – PPO | Admitting: Physical Therapy

## 2020-06-21 ENCOUNTER — Other Ambulatory Visit: Payer: Self-pay

## 2020-06-21 ENCOUNTER — Telehealth: Payer: Self-pay | Admitting: *Deleted

## 2020-06-21 ENCOUNTER — Ambulatory Visit
Admission: RE | Admit: 2020-06-21 | Discharge: 2020-06-21 | Disposition: A | Discharge status: Home / Self Care | Payer: BC Managed Care – PPO | Source: Ambulatory Visit | Attending: Radiation Oncology | Admitting: Radiation Oncology

## 2020-06-21 ENCOUNTER — Encounter: Payer: Self-pay | Admitting: Radiation Oncology

## 2020-06-21 ENCOUNTER — Encounter: Payer: Self-pay | Admitting: Physical Therapy

## 2020-06-21 ENCOUNTER — Ambulatory Visit: Payer: BC Managed Care – PPO | Admitting: Physical Therapy

## 2020-06-21 VITALS — BP 136/75 | HR 76 | Temp 98.1°F | Resp 20 | Ht 65.0 in | Wt 197.0 lb

## 2020-06-21 DIAGNOSIS — M129 Arthropathy, unspecified: Secondary | ICD-10-CM | POA: Diagnosis not present

## 2020-06-21 DIAGNOSIS — M25611 Stiffness of right shoulder, not elsewhere classified: Secondary | ICD-10-CM | POA: Diagnosis not present

## 2020-06-21 DIAGNOSIS — F419 Anxiety disorder, unspecified: Secondary | ICD-10-CM | POA: Insufficient documentation

## 2020-06-21 DIAGNOSIS — M25612 Stiffness of left shoulder, not elsewhere classified: Secondary | ICD-10-CM | POA: Diagnosis not present

## 2020-06-21 DIAGNOSIS — I10 Essential (primary) hypertension: Secondary | ICD-10-CM | POA: Diagnosis not present

## 2020-06-21 DIAGNOSIS — C50911 Malignant neoplasm of unspecified site of right female breast: Secondary | ICD-10-CM

## 2020-06-21 DIAGNOSIS — M810 Age-related osteoporosis without current pathological fracture: Secondary | ICD-10-CM | POA: Insufficient documentation

## 2020-06-21 DIAGNOSIS — C50912 Malignant neoplasm of unspecified site of left female breast: Secondary | ICD-10-CM | POA: Insufficient documentation

## 2020-06-21 DIAGNOSIS — K219 Gastro-esophageal reflux disease without esophagitis: Secondary | ICD-10-CM | POA: Insufficient documentation

## 2020-06-21 DIAGNOSIS — Z483 Aftercare following surgery for neoplasm: Secondary | ICD-10-CM

## 2020-06-21 DIAGNOSIS — Z170 Estrogen receptor positive status [ER+]: Secondary | ICD-10-CM | POA: Insufficient documentation

## 2020-06-21 DIAGNOSIS — Z86010 Personal history of colonic polyps: Secondary | ICD-10-CM | POA: Insufficient documentation

## 2020-06-21 DIAGNOSIS — R600 Localized edema: Secondary | ICD-10-CM | POA: Diagnosis not present

## 2020-06-21 DIAGNOSIS — M6281 Muscle weakness (generalized): Secondary | ICD-10-CM | POA: Diagnosis not present

## 2020-06-21 DIAGNOSIS — Z79899 Other long term (current) drug therapy: Secondary | ICD-10-CM | POA: Diagnosis not present

## 2020-06-21 DIAGNOSIS — F17210 Nicotine dependence, cigarettes, uncomplicated: Secondary | ICD-10-CM | POA: Diagnosis not present

## 2020-06-21 DIAGNOSIS — R293 Abnormal posture: Secondary | ICD-10-CM | POA: Diagnosis not present

## 2020-06-21 NOTE — Telephone Encounter (Signed)
Michelene Gardener [place] in lobby asking registration to see forms nurse regarding short term disability.  "[employer] spoke with Ms Enid Derry, have called for you every day for paperwork to approve April 12, 2020 through October, 26, 2021.  What is the status of my case?"  This nurse has not received a call, voicemail or form since completion of form 05/01/2020.  Further information received from [place] staff.  Connected with patient providing [employer] Management Department number [phone].  [employer] signed 05-11-2020 received by [facility] [employer] on 06/16/2020 was received by [facility] today.  No further status information.  Currently denies further questions or needs.

## 2020-06-21 NOTE — Therapy (Signed)
[place], [place], [address] Phone: [phone]   Fax:  [phone]  Physical Therapy Treatment  Patient Details  Name: [name] [name] MRN: [id number] Date of Birth: 1955-11-22 Referring Provider (PT): [name]   Encounter Date: 06/21/2020   PT End of Session - 06/21/20 1201    Visit Number 6    Number of Visits 10    Date for PT Re-Evaluation 07/03/20    PT Start Time 1110    PT Stop Time 1200    PT Time Calculation (min) 50 min    Activity Tolerance Patient tolerated treatment well    Behavior During Therapy [facility] for tasks assessed/performed           Past Medical History:  Diagnosis Date  . Abnormal glucose   . Anxiety    05/05/20  . Arthritis   . Breast cancer ([place]) 03/2020  . Esophageal stricture    Dr.Dora Olevia Perches  . GERD (gastroesophageal reflux disease)   . History of colonic polyps   . Hyperlipidemia   . Hypertension   . MVP (mitral valve prolapse)    history of   . Osteoporosis   . Tobacco abuse    tried Chantix- caused agression    Past Surgical History:  Procedure Laterality Date  . APPENDECTOMY  2008  . AXILLARY SENTINEL NODE BIOPSY Right 05/09/2020   Procedure: RIGHT AXILLARY SENTINEL NODE BIOPSY;  Surgeon: Alphonsa Overall, MD;  Location: [place];  Service: General;  Laterality: Right;  . BREAST BIOPSY  2000  . BREAST BIOPSY Bilateral 2021  . BREAST LUMPECTOMY WITH RADIOACTIVE SEED LOCALIZATION Right 05/09/2020   Procedure: RIGHT BREAST LUMPECTOMY WITH RADIOACTIVE SEED;  Surgeon: Alphonsa Overall, MD;  Location: [place];  Service: General;  Laterality: Right;  . COLONOSCOPY    . ESOPHAGOGASTRODUODENOSCOPY    . MASTECTOMY, PARTIAL Left 05/09/2020   Procedure: LEFT MASECTOMY;  Surgeon: Alphonsa Overall, MD;  Location: [place];  Service: General;  Laterality: Left;  . SENTINEL NODE BIOPSY Left 05/09/2020   Procedure: LEFT AXILLARY SENTINEL NODE BIOPSY;  Surgeon: Alphonsa Overall, MD;   Location: [place];  Service: General;  Laterality: Left;  . 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 vocal cord polyps removed     benigh, Dr Erik Obey    There were no vitals filed for this visit.   Subjective Assessment - 06/21/20 1109    Subjective I did not really exercise while I was sick. I am feeling better. There are certain ways I raise my arm that hurts.    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 Yes    Pain Score 4     Pain Location Arm    Pain Orientation Left;Upper;Posterior    Pain Descriptors / Indicators Discomfort    Pain Type Acute pain    Pain Onset In the past 7 days    Pain Frequency Intermittent    Aggravating Factors  raising arm a certain way    Pain Relieving Factors massage    Effect of Pain on Daily Activities hurts to lift  arm                             [facility] Adult PT Treatment/Exercise - 06/21/20 0001      Manual Therapy   Soft tissue mobilization to right pec in supine and serratus in area of trigger points then to right sidelying to left posterior scapular musculature and deltoid muscle to decrease pain and tightness, then cross friction massage across rotator cuff tendons                       PT Long Term Goals - 05/29/20 7867      PT LONG TERM GOAL #1   Title Pt will return to baseline ROM measurements to allow pt to return to PLOF.    Time 8    Period Weeks    Status On-going    Target Date 07/03/20      PT LONG TERM GOAL #2   Title Pt will demonstate 165 degrees of bilateral shoulder flexion to allow her to reach overhead     Baseline R 160, L 144    Time 5    Period Weeks    Status New    Target Date 07/03/20      PT LONG TERM GOAL #3   Title Pt will demonstrate 165 degrees of bilateral shoulder abduction to allow pt to reach out to the side    Baseline R 140 L 111    Time 5    Period Weeks    Status New    Target Date 07/03/20      PT LONG TERM GOAL #4   Title Pt will report no increased skin sensitivty at anterior chest to allow improved comfort    Time 5    Period Weeks    Status New    Target Date 07/03/20      PT LONG TERM GOAL #5   Title Pt will be independent in a home exercise program for long term stretching and strengthening    Time 5    Period Weeks    Status New    Target Date 07/03/20                 Plan - 06/21/20 1202    Clinical Impression Statement Pt reports to PT with increased pain in left shoulder when raising LUE. She reports it is a discomfort that goes from her shoulder down posterior arm. She has numerous trigger points in serratus, lats and posterior scapular musculature. Soft tissue mobilization performed to all of these areas to help decrease tightness and pain. She also had pain in area of rotator cuff tendons so performed cross friction massage to this area. At end of session pt felt improvement but was still having discomfort with certain movements. Educated pt to try to rest her arm for a few days and avoid painful motions.    PT Frequency 2x / week    PT Duration --   5 weeks   PT Treatment/Interventions ADLs/Self Care Home Management;Therapeutic exercise;Patient/family education;Manual lymph drainage;Manual techniques;Taping;Passive range of motion;Scar mobilization    PT Next Visit Plan Cont pulley and wall exercises assessing scapular engagement, add scapular strengthening. continue to monitor bilat UE circumferences, cont PROM to bilateral shoulders, MLD to L lateral trunk    PT Home Exercise Plan post op breast exercises., supine dowel exercises; end  ROM stretching in doorway    Consulted and  Agree with Plan of Care Patient           Patient will benefit from skilled therapeutic intervention in order to improve the following deficits and impairments:  Postural dysfunction, Decreased knowledge of precautions, Impaired flexibility, Impaired UE functional use, Increased fascial restricitons, Decreased strength, Decreased range of motion, Decreased scar mobility, Increased edema  Visit Diagnosis: Stiffness of left shoulder, not elsewhere classified  Aftercare following surgery for neoplasm     Problem List Patient Active Problem List   Diagnosis Date Noted  . Bilateral breast cancer ([place]) 03/24/2020  . Hypertension 10/20/2017  . History of squamous cell carcinoma of skin 09/12/2016  . 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 OSA (obstructive sleep apnea) 01/18/2013  . Adjustment disorder with mixed anxiety and depressed mood 08/17/2012  . Hot flashes 03/23/2012  . GERD 08/28/2009  . Abnormal glucose 08/28/2009  . PARESTHESIA 11/11/2008  . History of colonic polyps 11/11/2008  . Benign paroxysmal positional vertigo 10/25/2008  . Hyperlipemia 09/30/2008  . TOBACCO ABUSE 09/30/2008  . Osteoporosis 09/30/2008    Allyson Sabal [facility] 06/21/2020, 12:04 PM  [place], [place], [address] Phone: [phone]   Fax:  [phone]  Name: NADALEE NEISWENDER MRN: [id number] Date of Birth: Jul 26, 1955  Manus Gunning, PT 06/21/20 12:04 PM

## 2020-06-22 ENCOUNTER — Other Ambulatory Visit: Payer: Self-pay

## 2020-06-22 ENCOUNTER — Ambulatory Visit
Admission: RE | Admit: 2020-06-22 | Discharge: 2020-06-22 | Disposition: A | Discharge status: Home / Self Care | Payer: BC Managed Care – PPO | Source: Ambulatory Visit | Attending: Radiation Oncology | Admitting: Radiation Oncology

## 2020-06-22 DIAGNOSIS — Z510 Encounter for antineoplastic radiation therapy: Secondary | ICD-10-CM | POA: Diagnosis not present

## 2020-06-22 DIAGNOSIS — Z170 Estrogen receptor positive status [ER+]: Secondary | ICD-10-CM | POA: Diagnosis not present

## 2020-06-22 DIAGNOSIS — C50912 Malignant neoplasm of unspecified site of left female breast: Secondary | ICD-10-CM | POA: Insufficient documentation

## 2020-06-22 DIAGNOSIS — C50911 Malignant neoplasm of unspecified site of right female breast: Secondary | ICD-10-CM | POA: Diagnosis not present

## 2020-06-26 ENCOUNTER — Encounter: Payer: Self-pay | Admitting: Physical Therapy

## 2020-06-26 ENCOUNTER — Other Ambulatory Visit: Payer: Self-pay

## 2020-06-26 ENCOUNTER — Telehealth: Payer: Self-pay | Admitting: Licensed Clinical Social Worker

## 2020-06-26 ENCOUNTER — Ambulatory Visit: Payer: BC Managed Care – PPO | Admitting: Physical Therapy

## 2020-06-26 ENCOUNTER — Encounter: Payer: Self-pay | Admitting: *Deleted

## 2020-06-26 ENCOUNTER — Telehealth: Payer: Self-pay | Admitting: Hematology and Oncology

## 2020-06-26 DIAGNOSIS — Z483 Aftercare following surgery for neoplasm: Secondary | ICD-10-CM | POA: Diagnosis not present

## 2020-06-26 DIAGNOSIS — R293 Abnormal posture: Secondary | ICD-10-CM | POA: Diagnosis not present

## 2020-06-26 DIAGNOSIS — M25612 Stiffness of left shoulder, not elsewhere classified: Secondary | ICD-10-CM

## 2020-06-26 DIAGNOSIS — M25611 Stiffness of right shoulder, not elsewhere classified: Secondary | ICD-10-CM | POA: Diagnosis not present

## 2020-06-26 DIAGNOSIS — M6281 Muscle weakness (generalized): Secondary | ICD-10-CM | POA: Diagnosis not present

## 2020-06-26 DIAGNOSIS — R600 Localized edema: Secondary | ICD-10-CM | POA: Diagnosis not present

## 2020-06-26 DIAGNOSIS — C50912 Malignant neoplasm of unspecified site of left female breast: Secondary | ICD-10-CM | POA: Diagnosis not present

## 2020-06-26 DIAGNOSIS — C50911 Malignant neoplasm of unspecified site of right female breast: Secondary | ICD-10-CM | POA: Diagnosis not present

## 2020-06-26 NOTE — Telephone Encounter (Signed)
[facility] Social Work   CSW attempted to call patient to check-in on coping. No answer. Left VM requesting patient call back at her convenience.   Christeen Douglas, LCSW

## 2020-06-26 NOTE — Therapy (Signed)
[place], [place], [address] Phone: [phone]   Fax:  [phone]  Physical Therapy Treatment  Patient Details  Name: [name] [name] MRN: [id number] Date of Birth: 1956/05/08 Referring Provider (PT): Lavone Neri Date: 06/26/2020   PT End of Session - 06/26/20 1217    Visit Number 7    Number of Visits 10    Date for PT Re-Evaluation 07/03/20    PT Start Time 1106    PT Stop Time 1208    PT Time Calculation (min) 62 min    Activity Tolerance Patient tolerated treatment well    Behavior During Therapy [facility] for tasks assessed/performed           Past Medical History:  Diagnosis Date  . Abnormal glucose   . Anxiety    05/05/20  . Arthritis   . Breast cancer ([place]) 03/2020  . Esophageal stricture    Dr.Dora Olevia Perches  . GERD (gastroesophageal reflux disease)   . History of colonic polyps   . Hyperlipidemia   . Hypertension   . MVP (mitral valve prolapse)    history of   . Osteoporosis   . Tobacco abuse    tried Chantix- caused agression    Past Surgical History:  Procedure Laterality Date  . APPENDECTOMY  2008  . AXILLARY SENTINEL NODE BIOPSY Right 05/09/2020   Procedure: RIGHT AXILLARY SENTINEL NODE BIOPSY;  Surgeon: Alphonsa Overall, MD;  Location: [place];  Service: General;  Laterality: Right;  . BREAST BIOPSY  2000  . BREAST BIOPSY Bilateral 2021  . BREAST LUMPECTOMY WITH RADIOACTIVE SEED LOCALIZATION Right 05/09/2020   Procedure: RIGHT BREAST LUMPECTOMY WITH RADIOACTIVE SEED;  Surgeon: Alphonsa Overall, MD;  Location: [place];  Service: General;  Laterality: Right;  . COLONOSCOPY    . ESOPHAGOGASTRODUODENOSCOPY    . MASTECTOMY, PARTIAL Left 05/09/2020   Procedure: LEFT MASECTOMY;  Surgeon: Alphonsa Overall, MD;  Location: [place];  Service: General;  Laterality: Left;  . SENTINEL NODE BIOPSY Left 05/09/2020   Procedure: LEFT AXILLARY SENTINEL NODE BIOPSY;  Surgeon: Alphonsa Overall, MD;   Location: [place];  Service: General;  Laterality: Left;  . 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 vocal cord polyps removed     benigh, Dr Erik Obey    There were no vitals filed for this visit.   Subjective Assessment - 06/26/20 1107    Subjective I had to have my arm in that position for radiation to get the mold measured and I can tell the effects of that position. I had to have help to raise it up.    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 No/denies    Pain Score 0-No pain                             [facility] Adult PT Treatment/Exercise - 06/26/20 0001      Shoulder Exercises: Supine   Horizontal ABduction Strengthening;Both;10 reps;Theraband   pt returned therapist demo   Theraband Level (Shoulder Horizontal ABduction)  Level 1 (Yellow)    External Rotation Strengthening;Both;10 reps;Theraband   pt returned therapist demo   Theraband Level (Shoulder External Rotation) Level 1 (Yellow)    Flexion Strengthening;Both;10 reps;Theraband   narrow and wide; pt returned therapist demo   Theraband Level (Shoulder Flexion) Level 1 (Yellow)    Diagonals Strengthening;Both;10 reps;Theraband   pt returned therapist demo   Theraband Level (Shoulder Diagonals) Level 1 (Yellow)      Manual Therapy   Soft tissue mobilization to left pec in supine while holding UE in prolonged stretch    Passive ROM in supine to L shoulder in to flexion and abduction with prolonged holds being mindful to avoid positions that cause any impingement like shoulder pain                       PT Long  Term Goals - 05/29/20 9678      PT LONG TERM GOAL #1   Title Pt will return to baseline ROM measurements to allow pt to return to PLOF.    Time 8    Period Weeks    Status On-going    Target Date 07/03/20      PT LONG TERM GOAL #2   Title Pt will demonstate 165 degrees of bilateral shoulder flexion to allow her to reach overhead    Baseline R 160, L 144    Time 5    Period Weeks    Status New    Target Date 07/03/20      PT LONG TERM GOAL #3   Title Pt will demonstrate 165 degrees of bilateral shoulder abduction to allow pt to reach out to the side    Baseline R 140 L 111    Time 5    Period Weeks    Status New    Target Date 07/03/20      PT LONG TERM GOAL #4   Title Pt will report no increased skin sensitivty at anterior chest to allow improved comfort    Time 5    Period Weeks    Status New    Target Date 07/03/20      PT LONG TERM GOAL #5   Title Pt will be independent in a home exercise program for long term stretching and strengthening    Time 5    Period Weeks    Status New    Target Date 07/03/20                 Plan - 06/26/20 1219    Clinical Impression Statement Pt recently had her radiation simulation and reports she had some increased pulling with the position and also had difficulty raising her arm at the end of the simulation. Instructed pt today in supine scapular strengthening exercises with yellow theraband to continue stretching her pecs and to begin strengthening her scapula to help decrease impingement like pain in left shoulder. Continued with manual therapy and PROM to L shoulder and soft tissue mobilization to left pec.    PT Frequency 2x / week    PT Duration --   5 weeks   PT Treatment/Interventions ADLs/Self Care Home Management;Therapeutic exercise;Patient/family education;Manual lymph drainage;Manual techniques;Taping;Passive range of motion;Scar mobilization    PT Next Visit Plan Cont pulley and wall exercises assessing scapular  engagement, assess indep with scapular strengthening. continue to monitor bilat UE circumferences, cont PROM to bilateral shoulders, MLD to L lateral trunk    PT Home Exercise Plan post op breast  exercises., supine dowel exercises; end ROM stretching in doorway, supine scap    Consulted and Agree with Plan of Care Patient           Patient will benefit from skilled therapeutic intervention in order to improve the following deficits and impairments:  Postural dysfunction,Decreased knowledge of precautions,Impaired flexibility,Impaired UE functional use,Increased fascial restricitons,Decreased strength,Decreased range of motion,Decreased scar mobility,Increased edema  Visit Diagnosis: Stiffness of left shoulder, not elsewhere classified  Aftercare following surgery for neoplasm     Problem List Patient Active Problem List   Diagnosis Date Noted  . Bilateral breast cancer ([place]) 03/24/2020  . Hypertension 10/20/2017  . History of squamous cell carcinoma of skin 09/12/2016  . 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 OSA (obstructive sleep apnea) 01/18/2013  . Adjustment disorder with mixed anxiety and depressed mood 08/17/2012  . Hot flashes 03/23/2012  . GERD 08/28/2009  . Abnormal glucose 08/28/2009  . PARESTHESIA 11/11/2008  . History of colonic polyps 11/11/2008  . Benign paroxysmal positional vertigo 10/25/2008  . Hyperlipemia 09/30/2008  . TOBACCO ABUSE 09/30/2008  . Osteoporosis 09/30/2008    Allyson Sabal [facility] 06/26/2020, 12:22 PM  [place], [place], [address] Phone: [phone]   Fax:  [phone]  Name: [name] [name] MRN: [id number] Date of Birth: 16-Aug-1955  Manus Gunning, PT 06/26/20 12:22 PM

## 2020-06-26 NOTE — Progress Notes (Signed)
Radiation Oncology         ([phone]) [phone] ________________________________  Name: [name] [name]        MRN: [id number]  Date of Service: 06/21/2020 DOB: May 09, 1956  WU:[id number], [name], MD  Nicholas Lose, MD     REFERRING PHYSICIAN: Nicholas Lose, MD   DIAGNOSIS: The encounter diagnosis was Bilateral malignant neoplasm of breast in female, unspecified estrogen receptor status, unspecified site of breast ([place]).   HISTORY OF PRESENT ILLNESS: [name] [name] is a 64 y.o. female seen  for a recent diagnosis of bilateral breast cancer.  The patient was found to have a mass in the right breast and screening detected distortion in the left.  The patient underwent diagnostic imaging that revealed a mass at 12 o'clock in the right breast measuring 9 x 9 x 7 mm, her axilla was negative for adenopathy.  She had the distortion at the left breast at 2 o'clock but also measured 2.5 x 1.9 x 1.8 cm and her axilla was negative for adenopathy.  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  It was recommended in breast oncology conference that she undergo MRI scan for extent of disease, followed by breast conserving surgery of both breasts, though the left finding would require nipple to be resected.  It was also recommended that she have Oncotype Dx genetics, and that she would benefit from adjuvant radiotherapy to bilateral breasts.  She has also undergone an MRI of the breasts on 04/02/2020, the known malignancy in the right breast measured 9 x 9 x 10 mm, the known malignancy in the left breast measured at least 3.8 x 2.5 x 2.5 cm extending to the nipple with mild skin thickening at the  level of the nipple, and there are multiple surrounding satellite lesions which would increase the total dimension to at least 5.6 x 5 cm, and no adenopathy was identified.   With all these findings, the patient did undergo left mastectomy with sentinel lymph node biopsy as well as right breast lumpectomy and sentinel lymph node biopsy on 05/09/20. Final pathology revealed a 1 cm invasive ductal carcinoma of the right breast, further excisional margins were negative and 2 sampled lymph nodes were negative in the right axilla.  Her left mastectomy specimen revealed a 6 cm invasive and in situ lobular carcinoma with clear margins associated complex sclerosing lesion with usual ductal hyperplasia and calcifications as well as fibroadenomatous and fibrocystic change was noted, one of her lymph nodes had isolated tumor cells of the 2 that were sampled.    PREVIOUS RADIATION THERAPY: No   PAST MEDICAL HISTORY:  Past Medical History:  Diagnosis Date  . Abnormal glucose   . Anxiety    05/05/20  . Arthritis   . Breast cancer ([place]) 03/2020  . Esophageal stricture    Dr.Dora Olevia Perches  . GERD (gastroesophageal reflux disease)   . History of colonic polyps   . Hyperlipidemia   . Hypertension   . MVP (mitral valve prolapse)    history of   . Osteoporosis   . Tobacco abuse    tried Chantix- caused agression       PAST SURGICAL  HISTORY: Past Surgical History:  Procedure Laterality Date  . APPENDECTOMY  2008  . AXILLARY SENTINEL NODE BIOPSY Right 05/09/2020   Procedure: RIGHT AXILLARY SENTINEL NODE BIOPSY;  Surgeon: Alphonsa Overall, MD;  Location: [facility];  Service: General;  Laterality: Right;  . BREAST BIOPSY  2000  . BREAST BIOPSY Bilateral 2021  . BREAST LUMPECTOMY WITH RADIOACTIVE SEED LOCALIZATION Right 05/09/2020   Procedure: RIGHT BREAST LUMPECTOMY WITH RADIOACTIVE SEED;  Surgeon: Alphonsa Overall, MD;  Location: [facility];  Service: General;  Laterality: Right;  . COLONOSCOPY    .  ESOPHAGOGASTRODUODENOSCOPY    . MASTECTOMY, PARTIAL Left 05/09/2020   Procedure: LEFT MASECTOMY;  Surgeon: Alphonsa Overall, MD;  Location: [facility];  Service: General;  Laterality: Left;  . SENTINEL NODE BIOPSY Left 05/09/2020   Procedure: LEFT AXILLARY SENTINEL NODE BIOPSY;  Surgeon: Alphonsa Overall, MD;  Location: [facility];  Service: General;  Laterality: Left;  . 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 vocal cord polyps removed     benigh, Dr Erik Obey     FAMILY HISTORY:  Family History  Problem Relation Age of Onset  . Hyperlipidemia Other   . Hiatal hernia Other        several family members  . Hypertension Mother   . Cancer Mother        apendix. right hemicolectomy  . Other Mother        MGUS  . Alcohol abuse Father   . Cirrhosis Father        deceased form cirrhosis  . Hypertension Father   . Other Other        Fh of abnormal glucose  . Hypertension Sister   . Hypertension Brother   . Colon cancer Neg Hx   . Esophageal cancer Neg Hx   . Rectal cancer Neg Hx      SOCIAL HISTORY:  reports that she has been smoking cigarettes. She started smoking about 41 years ago. She has a 40.00 pack-year smoking history. She has never used smokeless tobacco. She reports that she does not drink alcohol and does not use drugs. The patient is married and lives in [place]. 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 She has two small grandchildren she sees often.   ALLERGIES: Penicillins   MEDICATIONS:  Current Outpatient Medications  Medication Sig Dispense Refill  . acetaminophen (TYLENOL) 500 MG tablet Take 500 mg by mouth every 6 (six) hours as needed.    Marland Kitchen albuterol (VENTOLIN HFA) 108 (90 Base) MCG/ACT inhaler INHALE 2 PUFFS BY MOUTH EVERY 6 HOURS AS NEEDED (Patient taking differently: Inhale 2 puffs into the lungs every 6 (six) hours as needed for wheezing or shortness of breath. ) 8.5 g 2  . amLODipine (NORVASC) 2.5 MG  tablet Take 1 tablet (2.5 mg total) by mouth daily. (Patient taking differently: Take 2.5 mg by mouth every morning. ) 90 tablet 3  . atorvastatin (LIPITOR) 40 MG tablet Take 1 tablet (40 mg total) by mouth daily. (Patient taking differently: Take 40 mg by mouth every morning. ) 90 tablet 3  . azelastine (ASTELIN) 0.1 % nasal spray Place 2 sprays into both nostrils 2 (two) times daily. (Patient taking differently: Place 2 sprays into both nostrils 2 (two) times daily as needed (congestion/allergies.). ) 30 mL 12  . citalopram (CELEXA) 20 MG tablet Take 1 tablet (20 mg total) by mouth daily. (Patient taking differently: Take 20  mg by mouth every morning. ) 90 tablet 3  . esomeprazole (NEXIUM) 40 MG capsule TAKE 1 CAPSULE BY MOUTH TWICE A DAY BEFORE A MEAL (Patient taking differently: Take 40 mg by mouth in the morning and at bedtime. TAKE 1 CAPSULE BY MOUTH TWICE A DAY BEFORE A MEAL  >>>>>>CAN ONLY TAKE NEXIUM - BRAND NAME - NO GENERIC<<<<<) 180 capsule 3  . HYDROcodone-acetaminophen (NORCO/VICODIN) 5-325 MG tablet Take 1 tablet by mouth every 6 (six) hours as needed for moderate pain. 15 tablet 0   No current facility-administered medications for this encounter.     REVIEW OF SYSTEMS: On review of systems, the patient reports that she is doing well overall.  She feels like she is healed nicely from surgery, she is very thankful that she does not need any chemotherapy.  No other specific complaints are verbalized.    PHYSICAL EXAM:  Wt Readings from Last 3 Encounters:  06/21/20 197 lb (89.4 kg)  05/18/20 202 lb 1.6 oz (91.7 kg)  05/09/20 207 lb (93.9 kg)   Temp Readings from Last 3 Encounters:  06/21/20 98.1 F (36.7 C)  05/18/20 (!) 97 F (36.1 C) (Tympanic)  05/10/20 98 F (36.7 C) (Oral)   BP Readings from Last 3 Encounters:  06/21/20 136/75  05/18/20 (!) 143/93  05/10/20 (!) 147/65   Pulse Readings from Last 3 Encounters:  06/21/20 76  05/18/20 70  05/10/20 67    In  general this is a well appearing caucasian female in no acute distress. She's alert and oriented x4 and appropriate throughout the examination. Cardiopulmonary assessment is negative for acute distress and she exhibits normal effort.  Her left mastectomy scar line is healed well, no evidence of erythema or cellulitic streaking is appreciated.  The right breast lumpectomy scar is also well-healed, no infectious signs are identified.    ECOG = 1  0 - Asymptomatic (Fully active, able to carry on all predisease activities without restriction)  1 - Symptomatic but completely ambulatory (Restricted in physically strenuous activity but ambulatory and able to carry out work of a light or sedentary nature. For example, light housework, office work)  2 - Symptomatic, <50% in bed during the day (Ambulatory and capable of all self care but unable to carry out any work activities. Up and about more than 50% of waking hours)  3 - Symptomatic, >50% in bed, but not bedbound (Capable of only limited self-care, confined to bed or chair 50% or more of waking hours)  4 - Bedbound (Completely disabled. Cannot carry on any self-care. Totally confined to bed or chair)  5 - Death   Eustace Pen MM, Creech RH, Tormey DC, et al. [phone]). "Toxicity and response criteria of the [facility]". [place] Oncol. 5 (6): 649-55    LABORATORY DATA:  Lab Results  Component Value Date   WBC 9.3 05/05/2020   HGB 15.5 (H) 05/05/2020   HCT 46.9 (H) 05/05/2020   MCV 91.2 05/05/2020   PLT 197 05/05/2020   Lab Results  Component Value Date   NA 141 05/05/2020   K 3.7 05/05/2020   CL 104 05/05/2020   CO2 27 05/05/2020   Lab Results  Component Value Date   ALT 13 04/06/2020   AST 12 (L) 04/06/2020   ALKPHOS 126 04/06/2020   BILITOT 0.6 04/06/2020      RADIOGRAPHY: No results found.     IMPRESSION/PLAN: 1. Stage IIIA, pT4N0(mol+)M0 grade 2, ER/PR positive invasive lobular carcinoma of the  left  breast, and a synchronous Stage IA, pT1bN0M0 grade 1 invasive ductal carcinoma of the right breast. Dr. Lisbeth Renshaw discusses the final pathology findings and reviews the nature of bilateral breast disease.  Dr. Lisbeth Renshaw reviews the patient's course since her last visit with [place], and recommends a course of whole breast radiation to the right breast, as well as postmastectomy radiotherapy to the left chest wall and regional lymph nodes. We discussed the risks, benefits, short, and long term effects of radiotherapy, and the patient is interested in proceeding.  Dr. Lisbeth Renshaw recommends a course of 6 1/2 weeks of radiotherapy to both sites. Written consent is obtained and placed in the chart, a copy was provided to the patient.  She will return for simulation tomorrow.    In a visit lasting 45 minutes, greater than 50% of the time was spent face to face discussing the patient's condition, in preparation for the discussion, and coordinating the patient's care.    The above documentation reflects my direct findings during this shared patient visit. Please see the separate note by Dr. Lisbeth Renshaw on this date for the remainder of the patient's plan of care.    Carola Rhine, PAC

## 2020-06-26 NOTE — Patient Instructions (Signed)
Over Head Pull: Narrow and Wide Grip   Cancer Rehab [phone]   On back, knees bent, feet flat, band across thighs, elbows straight but relaxed. Pull hands apart (start). Keeping elbows straight, bring arms up and over head, hands toward floor. Keep pull steady on band. Hold momentarily. Return slowly, keeping pull steady, back to start. Then do same with a wider grip on the band (past shoulder width) Repeat _10__ times. Band color __yellow____   Side Pull: Double Arm   On back, knees bent, feet flat. Arms perpendicular to body, shoulder level, elbows straight but relaxed. Pull arms out to sides, elbows straight. Resistance band comes across collarbones, hands toward floor. Hold momentarily. Slowly return to starting position. Repeat _10__ times. Band color _yellow____   Sword   On back, knees bent, feet flat, left hand on left hip, right hand above left. Pull right arm DIAGONALLY (hip to shoulder) across chest. Bring right arm along head toward floor. Hold momentarily. Slowly return to starting position. Thumb is pointed down when by hip and rotates upwards when by head.  Repeat _10__ times. Do with left arm. Band color _yellow_____   Shoulder Rotation: Double Arm   On back, knees bent, feet flat, elbows tucked at sides, bent 90, hands palms up. Pull hands apart and down toward floor, keeping elbows near sides. Hold momentarily. Slowly return to starting position. Repeat _10__ times. Band color __yellow____

## 2020-06-26 NOTE — Telephone Encounter (Signed)
Scheduled appt per 12/13 sch msg - mailed reminder letter with appt date and time

## 2020-06-27 ENCOUNTER — Encounter (HOSPITAL_COMMUNITY): Payer: Self-pay | Admitting: Hematology and Oncology

## 2020-06-28 ENCOUNTER — Ambulatory Visit: Payer: BC Managed Care – PPO | Admitting: Physical Therapy

## 2020-06-29 ENCOUNTER — Encounter: Payer: Self-pay | Admitting: Licensed Clinical Social Worker

## 2020-06-29 NOTE — Progress Notes (Signed)
[facility] CSW Progress Note  Holiday representative spoke with patient by phone. Patient will be starting radiation treatment on Monday and is coping well. She is looking forward to the holidays with her family. Her sons have been helping set boundaries to help put patient first.    Patient also inquired about [employer]. CSW will bring them to the front desk for pt to pick-up tomorrow.    Christeen Douglas , LCSW

## 2020-06-30 ENCOUNTER — Ambulatory Visit
Admission: RE | Admit: 2020-06-30 | Payer: BC Managed Care – PPO | Source: Ambulatory Visit | Admitting: Radiation Oncology

## 2020-06-30 ENCOUNTER — Other Ambulatory Visit: Payer: Self-pay

## 2020-06-30 DIAGNOSIS — Z510 Encounter for antineoplastic radiation therapy: Secondary | ICD-10-CM | POA: Diagnosis not present

## 2020-06-30 DIAGNOSIS — C50912 Malignant neoplasm of unspecified site of left female breast: Secondary | ICD-10-CM | POA: Diagnosis not present

## 2020-06-30 DIAGNOSIS — Z170 Estrogen receptor positive status [ER+]: Secondary | ICD-10-CM | POA: Diagnosis not present

## 2020-06-30 DIAGNOSIS — C50911 Malignant neoplasm of unspecified site of right female breast: Secondary | ICD-10-CM | POA: Diagnosis not present

## 2020-07-03 ENCOUNTER — Ambulatory Visit
Admission: RE | Admit: 2020-07-03 | Discharge: 2020-07-03 | Disposition: A | Discharge status: Home / Self Care | Payer: BC Managed Care – PPO | Source: Ambulatory Visit | Attending: Radiation Oncology | Admitting: Radiation Oncology

## 2020-07-03 ENCOUNTER — Encounter: Payer: Self-pay | Admitting: Physical Therapy

## 2020-07-03 ENCOUNTER — Ambulatory Visit: Payer: BC Managed Care – PPO | Admitting: Physical Therapy

## 2020-07-03 ENCOUNTER — Other Ambulatory Visit: Payer: Self-pay

## 2020-07-03 DIAGNOSIS — C50911 Malignant neoplasm of unspecified site of right female breast: Secondary | ICD-10-CM

## 2020-07-03 DIAGNOSIS — Z170 Estrogen receptor positive status [ER+]: Secondary | ICD-10-CM | POA: Diagnosis not present

## 2020-07-03 DIAGNOSIS — R293 Abnormal posture: Secondary | ICD-10-CM

## 2020-07-03 DIAGNOSIS — C50912 Malignant neoplasm of unspecified site of left female breast: Secondary | ICD-10-CM

## 2020-07-03 DIAGNOSIS — M25612 Stiffness of left shoulder, not elsewhere classified: Secondary | ICD-10-CM

## 2020-07-03 DIAGNOSIS — R600 Localized edema: Secondary | ICD-10-CM | POA: Diagnosis not present

## 2020-07-03 DIAGNOSIS — M6281 Muscle weakness (generalized): Secondary | ICD-10-CM

## 2020-07-03 DIAGNOSIS — M25611 Stiffness of right shoulder, not elsewhere classified: Secondary | ICD-10-CM

## 2020-07-03 DIAGNOSIS — Z483 Aftercare following surgery for neoplasm: Secondary | ICD-10-CM

## 2020-07-03 DIAGNOSIS — Z510 Encounter for antineoplastic radiation therapy: Secondary | ICD-10-CM | POA: Diagnosis not present

## 2020-07-03 MED ORDER — ALRA NON-METALLIC DEODORANT (RAD-ONC)
1.0000 "application " | Freq: Once | TOPICAL | Status: AC
  Administered 2020-07-03: 1 via TOPICAL

## 2020-07-03 MED ORDER — RADIAPLEXRX EX GEL: Freq: Once | CUTANEOUS | Status: AC

## 2020-07-03 NOTE — Therapy (Signed)
[place], [place], [address] Phone: [phone]   Fax:  [phone]  Physical Therapy Treatment  Patient Details  Name: [name] [name] MRN: [id number] Date of Birth: [birth year] Referring Provider (PT): [name]   Encounter Date: 07/03/2020   PT End of Session - 07/03/20 1151    Visit Number 8    Number of Visits 18    Date for PT Re-Evaluation 07/31/20    PT Start Time 1106    PT Stop Time 1150    PT Time Calculation (min) 44 min    Activity Tolerance Patient tolerated treatment well    Behavior During Therapy [facility] for tasks assessed/performed           Past Medical History:  Diagnosis Date  . Abnormal glucose   . Anxiety    05/05/20  . Arthritis   . Breast cancer ([place]) 03/2020  . Esophageal stricture    Dr.Dora Olevia Perches  . GERD (gastroesophageal reflux disease)   . History of colonic polyps   . Hyperlipidemia   . Hypertension   . MVP (mitral valve prolapse)    history of   . Osteoporosis   . Tobacco abuse    tried Chantix- caused agression    Past Surgical History:  Procedure Laterality Date  . APPENDECTOMY  2008  . AXILLARY SENTINEL NODE BIOPSY Right 05/09/2020   Procedure: RIGHT AXILLARY SENTINEL NODE BIOPSY;  Surgeon: Alphonsa Overall, MD;  Location: [facility];  Service: General;  Laterality: Right;  . BREAST BIOPSY  2000  . BREAST BIOPSY Bilateral 2021  . BREAST LUMPECTOMY WITH RADIOACTIVE SEED LOCALIZATION Right 05/09/2020   Procedure: RIGHT BREAST LUMPECTOMY WITH RADIOACTIVE SEED;  Surgeon: Alphonsa Overall, MD;  Location: [facility];  Service: General;  Laterality: Right;  . COLONOSCOPY    . ESOPHAGOGASTRODUODENOSCOPY    . MASTECTOMY, PARTIAL Left 05/09/2020   Procedure: LEFT MASECTOMY;  Surgeon: Alphonsa Overall, MD;  Location: [facility];  Service: General;  Laterality: Left;  . SENTINEL NODE BIOPSY Left 05/09/2020   Procedure: LEFT AXILLARY SENTINEL NODE BIOPSY;  Surgeon: Alphonsa Overall, MD;   Location: [facility];  Service: General;  Laterality: Left;  . 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 vocal cord polyps removed     benigh, Dr Erik Obey    There were no vitals filed for this visit.   Subjective Assessment - 07/03/20 1107    Subjective I need to leave a little early today because I have my first radiation.    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 No/denies    Pain Score 0-No pain                             [facility] Adult PT Treatment/Exercise - 07/03/20 0001      Manual Therapy   Soft tissue mobilization to left pec in supine while holding UE in prolonged stretch and in right sidelying to L serratus, rhomboids and lats where pt was having increased pain and tightness    Passive ROM in  supine to L shoulder in to flexion and abduction with prolonged holds being mindful to avoid positions that cause any impingement like shoulder pain                       PT Long Term Goals - 07/03/20 1107      PT LONG TERM GOAL #1   Title Pt will return to baseline ROM measurements to allow pt to return to PLOF.    Time 8    Period Weeks    Status Achieved      PT LONG TERM GOAL #2   Title Pt will demonstate 165 degrees of bilateral shoulder flexion to allow her to reach overhead    Baseline R 160, L 144; 07/03/20- R 165 L 168    Time 5    Period Weeks    Status Achieved      PT LONG TERM GOAL #3   Title Pt will demonstrate 165 degrees of bilateral shoulder abduction to allow pt to reach out to the side    Baseline R 140 L 111; 07/03/20- R 178 L 179    Time 5     Period Weeks    Status Achieved      PT LONG TERM GOAL #4   Title Pt will report no increased skin sensitivty at anterior chest to allow improved comfort    Time 5    Period Weeks    Status Achieved      PT LONG TERM GOAL #5   Title Pt will be independent in a home exercise program for long term stretching and strengthening    Time 5    Period Weeks    Status On-going      Additional Long Term Goals   Additional Long Term Goals Yes      PT LONG TERM GOAL #6   Title Pt will report 75% improvement in muscle tightness across in left pec and left lateral trunk to allow improved comfort.    Time 4    Period Weeks    Status New    Target Date 07/31/20                 Plan - 07/03/20 1156    Clinical Impression Statement Pt begins radiation today and requested to end session a few minutes early to give her time to get to her appointment. Assessed pt's progress towards goals in therapy. Pt is progressing towards her goals and has met her bilateral shouler ROM goals. She now has full shoulder ROM bilaterally. She is still having trouble with increased muscle tightness across L pec, serratus, lats and rhomboids that causes her discomfort so added a goal today to address that. Radiation will most likely increase pt's tightness. Pt would benefit from addtional skilled PT services at 2x/wk for another 4 weeks to reduce tightness and continue to stretch throughout radiation to prevent tightness and progress HEP.    PT Frequency 2x / week    PT Duration --   5 weeks   PT Treatment/Interventions ADLs/Self Care Home Management;Therapeutic exercise;Patient/family education;Manual lymph drainage;Manual techniques;Taping;Passive range of motion;Scar mobilization    PT Next Visit Plan Cont pulley and wall exercises assessing scapular engagement, assess indep with scapular strengthening. continue to monitor bilat UE circumferences, cont PROM to bilateral shoulders, MLD to L lateral trunk    PT  Home Exercise Plan post op breast exercises., supine dowel exercises; end ROM stretching in doorway, supine scap  Consulted and Agree with Plan of Care Patient           Patient will benefit from skilled therapeutic intervention in order to improve the following deficits and impairments:  Postural dysfunction,Decreased knowledge of precautions,Impaired flexibility,Impaired UE functional use,Increased fascial restricitons,Decreased strength,Decreased range of motion,Decreased scar mobility,Increased edema  Visit Diagnosis: Stiffness of left shoulder, not elsewhere classified  Aftercare following surgery for neoplasm  Stiffness of right shoulder, not elsewhere classified  Localized edema  Abnormal posture  Muscle weakness (generalized)  Malignant neoplasm of right female breast, unspecified estrogen receptor status, unspecified site of breast ([place])  Malignant neoplasm of left female breast, unspecified estrogen receptor status, unspecified site of breast [facility])     Problem List Patient Active Problem List   Diagnosis Date Noted  . Bilateral breast cancer ([place]) 03/24/2020  . Hypertension 10/20/2017  . History of squamous cell carcinoma of skin 09/12/2016  . 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 OSA (obstructive sleep apnea) 01/18/2013  . Adjustment disorder with mixed anxiety and depressed mood 08/17/2012  . Hot flashes 03/23/2012  . GERD 08/28/2009  . Abnormal glucose 08/28/2009  . PARESTHESIA 11/11/2008  . History of colonic polyps 11/11/2008  . Benign paroxysmal positional vertigo 10/25/2008  . Hyperlipemia 09/30/2008  . TOBACCO ABUSE 09/30/2008  . Osteoporosis 09/30/2008    Allyson Sabal [facility] 07/03/2020, 11:59 AM  [place], [place], [address] Phone: [phone]   Fax:  [phone]  Name: [name] [name] MRN: [id number] Date of Birth: 03-Feb-1956  Manus Gunning, PT 07/03/20 11:59 AM

## 2020-07-03 NOTE — Progress Notes (Signed)
Pt here for patient teaching.  Pt given Radiation and You booklet, skin care instructions, Alra deodorant and Radiaplex gel.  Reviewed areas of pertinence such as fatigue, hair loss, skin changes, breast tenderness and breast swelling . Pt able to give teach back of to pat skin and use unscented/gentle soap,apply Radiaplex bid, avoid applying anything to skin within 4 hours of treatment, avoid wearing an under wire bra and to use an electric razor if they must shave. Pt verbalizes understanding of information given and will contact nursing with any questions or concerns.     Brittay Mogle M. Jessie Cowher RN, BSN      

## 2020-07-04 ENCOUNTER — Ambulatory Visit
Admission: RE | Admit: 2020-07-04 | Discharge: 2020-07-04 | Disposition: A | Discharge status: Home / Self Care | Payer: BC Managed Care – PPO | Source: Ambulatory Visit | Attending: Radiation Oncology | Admitting: Radiation Oncology

## 2020-07-04 DIAGNOSIS — C50912 Malignant neoplasm of unspecified site of left female breast: Secondary | ICD-10-CM | POA: Diagnosis not present

## 2020-07-04 DIAGNOSIS — C50911 Malignant neoplasm of unspecified site of right female breast: Secondary | ICD-10-CM | POA: Diagnosis not present

## 2020-07-04 DIAGNOSIS — Z510 Encounter for antineoplastic radiation therapy: Secondary | ICD-10-CM | POA: Diagnosis not present

## 2020-07-04 DIAGNOSIS — Z170 Estrogen receptor positive status [ER+]: Secondary | ICD-10-CM | POA: Diagnosis not present

## 2020-07-05 ENCOUNTER — Ambulatory Visit: Payer: BC Managed Care – PPO | Admitting: Physical Therapy

## 2020-07-05 ENCOUNTER — Ambulatory Visit
Admission: RE | Admit: 2020-07-05 | Discharge: 2020-07-05 | Disposition: A | Discharge status: Home / Self Care | Payer: BC Managed Care – PPO | Source: Ambulatory Visit | Attending: Radiation Oncology | Admitting: Radiation Oncology

## 2020-07-05 DIAGNOSIS — C50912 Malignant neoplasm of unspecified site of left female breast: Secondary | ICD-10-CM | POA: Diagnosis not present

## 2020-07-05 DIAGNOSIS — Z510 Encounter for antineoplastic radiation therapy: Secondary | ICD-10-CM | POA: Diagnosis not present

## 2020-07-05 DIAGNOSIS — Z170 Estrogen receptor positive status [ER+]: Secondary | ICD-10-CM | POA: Diagnosis not present

## 2020-07-05 DIAGNOSIS — C50911 Malignant neoplasm of unspecified site of right female breast: Secondary | ICD-10-CM | POA: Diagnosis not present

## 2020-07-06 ENCOUNTER — Other Ambulatory Visit: Payer: Self-pay

## 2020-07-06 ENCOUNTER — Ambulatory Visit
Admission: RE | Admit: 2020-07-06 | Discharge: 2020-07-06 | Disposition: A | Discharge status: Home / Self Care | Payer: BC Managed Care – PPO | Source: Ambulatory Visit | Attending: Radiation Oncology | Admitting: Radiation Oncology

## 2020-07-06 DIAGNOSIS — C50911 Malignant neoplasm of unspecified site of right female breast: Secondary | ICD-10-CM

## 2020-07-06 DIAGNOSIS — Z170 Estrogen receptor positive status [ER+]: Secondary | ICD-10-CM | POA: Diagnosis not present

## 2020-07-06 DIAGNOSIS — Z510 Encounter for antineoplastic radiation therapy: Secondary | ICD-10-CM | POA: Diagnosis not present

## 2020-07-06 DIAGNOSIS — C50912 Malignant neoplasm of unspecified site of left female breast: Secondary | ICD-10-CM | POA: Diagnosis not present

## 2020-07-06 MED ORDER — RADIAPLEXRX EX GEL: Freq: Once | CUTANEOUS | Status: AC

## 2020-07-10 ENCOUNTER — Ambulatory Visit
Admission: RE | Admit: 2020-07-10 | Discharge: 2020-07-10 | Disposition: A | Discharge status: Home / Self Care | Payer: BC Managed Care – PPO | Source: Ambulatory Visit | Attending: Radiation Oncology | Admitting: Radiation Oncology

## 2020-07-10 DIAGNOSIS — Z510 Encounter for antineoplastic radiation therapy: Secondary | ICD-10-CM | POA: Diagnosis not present

## 2020-07-10 DIAGNOSIS — C50911 Malignant neoplasm of unspecified site of right female breast: Secondary | ICD-10-CM | POA: Diagnosis not present

## 2020-07-10 DIAGNOSIS — Z170 Estrogen receptor positive status [ER+]: Secondary | ICD-10-CM | POA: Diagnosis not present

## 2020-07-10 DIAGNOSIS — C50912 Malignant neoplasm of unspecified site of left female breast: Secondary | ICD-10-CM | POA: Diagnosis not present

## 2020-07-11 ENCOUNTER — Ambulatory Visit: Payer: BC Managed Care – PPO

## 2020-07-12 ENCOUNTER — Other Ambulatory Visit: Payer: Self-pay

## 2020-07-12 ENCOUNTER — Ambulatory Visit
Admission: RE | Admit: 2020-07-12 | Discharge: 2020-07-12 | Disposition: A | Discharge status: Home / Self Care | Payer: BC Managed Care – PPO | Source: Ambulatory Visit | Attending: Radiation Oncology | Admitting: Radiation Oncology

## 2020-07-12 DIAGNOSIS — C50911 Malignant neoplasm of unspecified site of right female breast: Secondary | ICD-10-CM | POA: Diagnosis not present

## 2020-07-12 DIAGNOSIS — Z170 Estrogen receptor positive status [ER+]: Secondary | ICD-10-CM | POA: Diagnosis not present

## 2020-07-12 DIAGNOSIS — C50912 Malignant neoplasm of unspecified site of left female breast: Secondary | ICD-10-CM | POA: Diagnosis not present

## 2020-07-12 DIAGNOSIS — Z510 Encounter for antineoplastic radiation therapy: Secondary | ICD-10-CM | POA: Diagnosis not present

## 2020-07-13 ENCOUNTER — Ambulatory Visit
Admission: RE | Admit: 2020-07-13 | Discharge: 2020-07-13 | Disposition: A | Discharge status: Home / Self Care | Payer: BC Managed Care – PPO | Source: Ambulatory Visit | Attending: Radiation Oncology | Admitting: Radiation Oncology

## 2020-07-13 DIAGNOSIS — Z170 Estrogen receptor positive status [ER+]: Secondary | ICD-10-CM | POA: Diagnosis not present

## 2020-07-13 DIAGNOSIS — Z510 Encounter for antineoplastic radiation therapy: Secondary | ICD-10-CM | POA: Diagnosis not present

## 2020-07-13 DIAGNOSIS — C50912 Malignant neoplasm of unspecified site of left female breast: Secondary | ICD-10-CM | POA: Diagnosis not present

## 2020-07-13 DIAGNOSIS — C50911 Malignant neoplasm of unspecified site of right female breast: Secondary | ICD-10-CM | POA: Diagnosis not present

## 2020-07-17 ENCOUNTER — Ambulatory Visit
Admission: RE | Admit: 2020-07-17 | Discharge: 2020-07-17 | Disposition: A | Discharge status: Home / Self Care | Payer: BC Managed Care – PPO | Source: Ambulatory Visit | Attending: Radiation Oncology | Admitting: Radiation Oncology

## 2020-07-17 ENCOUNTER — Encounter: Payer: BC Managed Care – PPO | Admitting: Physical Therapy

## 2020-07-17 ENCOUNTER — Other Ambulatory Visit: Payer: Self-pay

## 2020-07-17 DIAGNOSIS — C50912 Malignant neoplasm of unspecified site of left female breast: Secondary | ICD-10-CM | POA: Insufficient documentation

## 2020-07-17 DIAGNOSIS — Z170 Estrogen receptor positive status [ER+]: Secondary | ICD-10-CM | POA: Diagnosis not present

## 2020-07-17 DIAGNOSIS — C50911 Malignant neoplasm of unspecified site of right female breast: Secondary | ICD-10-CM | POA: Diagnosis not present

## 2020-07-18 ENCOUNTER — Ambulatory Visit
Admission: RE | Admit: 2020-07-18 | Discharge: 2020-07-18 | Disposition: A | Discharge status: Home / Self Care | Payer: BC Managed Care – PPO | Source: Ambulatory Visit | Attending: Radiation Oncology | Admitting: Radiation Oncology

## 2020-07-18 DIAGNOSIS — C50912 Malignant neoplasm of unspecified site of left female breast: Secondary | ICD-10-CM | POA: Diagnosis not present

## 2020-07-18 DIAGNOSIS — C50911 Malignant neoplasm of unspecified site of right female breast: Secondary | ICD-10-CM | POA: Diagnosis not present

## 2020-07-18 DIAGNOSIS — Z170 Estrogen receptor positive status [ER+]: Secondary | ICD-10-CM | POA: Diagnosis not present

## 2020-07-19 ENCOUNTER — Other Ambulatory Visit: Payer: Self-pay

## 2020-07-19 ENCOUNTER — Encounter: Payer: Self-pay | Admitting: Physical Therapy

## 2020-07-19 ENCOUNTER — Ambulatory Visit
Admission: RE | Admit: 2020-07-19 | Discharge: 2020-07-19 | Disposition: A | Discharge status: Home / Self Care | Payer: BC Managed Care – PPO | Source: Ambulatory Visit | Attending: Radiation Oncology | Admitting: Radiation Oncology

## 2020-07-19 ENCOUNTER — Ambulatory Visit: Payer: BC Managed Care – PPO | Attending: Surgery | Admitting: Physical Therapy

## 2020-07-19 DIAGNOSIS — Z483 Aftercare following surgery for neoplasm: Secondary | ICD-10-CM | POA: Diagnosis not present

## 2020-07-19 DIAGNOSIS — C50911 Malignant neoplasm of unspecified site of right female breast: Secondary | ICD-10-CM | POA: Diagnosis not present

## 2020-07-19 DIAGNOSIS — C50912 Malignant neoplasm of unspecified site of left female breast: Secondary | ICD-10-CM | POA: Diagnosis not present

## 2020-07-19 DIAGNOSIS — M25612 Stiffness of left shoulder, not elsewhere classified: Secondary | ICD-10-CM | POA: Diagnosis not present

## 2020-07-19 DIAGNOSIS — M25611 Stiffness of right shoulder, not elsewhere classified: Secondary | ICD-10-CM | POA: Diagnosis not present

## 2020-07-19 DIAGNOSIS — Z170 Estrogen receptor positive status [ER+]: Secondary | ICD-10-CM | POA: Diagnosis not present

## 2020-07-19 NOTE — Therapy (Signed)
[place], [place], [address] Phone: [phone]   Fax:  [phone]  Physical Therapy Treatment  Patient Details  Name: [name] [name] MRN: [id number] Date of Birth: [birth year] Referring Provider (PT): [name]   Encounter Date: 07/19/2020   PT End of Session - 07/19/20 [id number]    Visit Number 9    Number of Visits 18    Date for PT Re-Evaluation 07/31/20    PT Start Time 1104    PT Stop Time 1156    PT Time Calculation (min) 52 min    Activity Tolerance Patient tolerated treatment well    Behavior During Therapy [facility] for tasks assessed/performed           Past Medical History:  Diagnosis Date  . Abnormal glucose   . Anxiety    05/05/20  . Arthritis   . Breast cancer ([place]) 03/2020  . Esophageal stricture    Dr.Dora Olevia Perches  . GERD (gastroesophageal reflux disease)   . History of colonic polyps   . Hyperlipidemia   . Hypertension   . MVP (mitral valve prolapse)    history of   . Osteoporosis   . Tobacco abuse    tried Chantix- caused agression    Past Surgical History:  Procedure Laterality Date  . APPENDECTOMY  2008  . AXILLARY SENTINEL NODE BIOPSY Right 05/09/2020   Procedure: RIGHT AXILLARY SENTINEL NODE BIOPSY;  Surgeon: Alphonsa Overall, MD;  Location: [place];  Service: General;  Laterality: Right;  . BREAST BIOPSY  2000  . BREAST BIOPSY Bilateral 2021  . BREAST LUMPECTOMY WITH RADIOACTIVE SEED LOCALIZATION Right 05/09/2020   Procedure: RIGHT BREAST LUMPECTOMY WITH RADIOACTIVE SEED;  Surgeon: Alphonsa Overall, MD;  Location: [place];  Service: General;  Laterality: Right;  . COLONOSCOPY    . ESOPHAGOGASTRODUODENOSCOPY    . MASTECTOMY, PARTIAL Left 05/09/2020   Procedure: LEFT MASECTOMY;  Surgeon: Alphonsa Overall, MD;  Location: [place];  Service: General;  Laterality: Left;  . SENTINEL NODE BIOPSY Left 05/09/2020   Procedure: LEFT AXILLARY SENTINEL NODE BIOPSY;  Surgeon: Alphonsa Overall, MD;   Location: [place];  Service: General;  Laterality: Left;  . 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 vocal cord polyps removed     benigh, Dr Erik Obey    There were no vitals filed for this visit.   Subjective Assessment - 07/19/20 1106    Subjective The shoulder is doing better. I still have sensitivity across my chest and under my arm.    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 No/denies    Pain Score 0-No pain                             [facility] Adult PT Treatment/Exercise - 07/19/20 0001      Manual Therapy   Soft tissue mobilization to left pec in supine while holding UE in prolonged stretch and in right sidelying to L serratus, rhomboids and lats where pt was had a spasm and increased tightness  Passive ROM in supine to L shoulder in to flexion and abduction with prolonged holds. Pt had no impingement like pain today                       PT Long Term Goals - 07/03/20 1107      PT LONG TERM GOAL #1   Title Pt will return to baseline ROM measurements to allow pt to return to PLOF.    Time 8    Period Weeks    Status Achieved      PT LONG TERM GOAL #2   Title Pt will demonstate 165 degrees of bilateral shoulder flexion to allow her to reach overhead    Baseline R 160, L 144; 07/03/20- R 165 L 168    Time 5    Period Weeks    Status Achieved      PT LONG TERM GOAL #3   Title Pt will demonstrate 165 degrees of bilateral shoulder abduction to allow pt to reach out to the side    Baseline R 140 L 111; 07/03/20- R 178 L 179    Time 5    Period Weeks     Status Achieved      PT LONG TERM GOAL #4   Title Pt will report no increased skin sensitivty at anterior chest to allow improved comfort    Time 5    Period Weeks    Status Achieved      PT LONG TERM GOAL #5   Title Pt will be independent in a home exercise program for long term stretching and strengthening    Time 5    Period Weeks    Status On-going      Additional Long Term Goals   Additional Long Term Goals Yes      PT LONG TERM GOAL #6   Title Pt will report 75% improvement in muscle tightness across in left pec and left lateral trunk to allow improved comfort.    Time 4    Period Weeks    Status New    Target Date 07/31/20                 Plan - 07/19/20 1159    Clinical Impression Statement Pt's shoulder was doing better today. She did not have any impingement type pain in her shoulder today with PROM. She is still having trouble with muscle tightness and spasms especially at left pec muscle so spent most of session with focus on soft tissue mobilization to this area to improve comfort.    PT Frequency 2x / week    PT Duration --   5 weeks   PT Treatment/Interventions ADLs/Self Care Home Management;Therapeutic exercise;Patient/family education;Manual lymph drainage;Manual techniques;Taping;Passive range of motion;Scar mobilization    PT Next Visit Plan Cont pulley and wall exercises assessing scapular engagement, assess indep with scapular strengthening. continue to monitor bilat UE circumferences, cont PROM to bilateral shoulders, MLD to L lateral trunk    PT Home Exercise Plan post op breast exercises., supine dowel exercises; end ROM stretching in doorway, supine scap    Consulted and Agree with Plan of Care Patient           Patient will benefit from skilled therapeutic intervention in order to improve the following deficits and impairments:  Postural dysfunction,Decreased knowledge of precautions,Impaired flexibility,Impaired UE functional use,Increased  fascial restricitons,Decreased strength,Decreased range of motion,Decreased scar mobility,Increased edema  Visit Diagnosis: Stiffness of left shoulder, not elsewhere  classified  Aftercare following surgery for neoplasm     Problem List Patient Active Problem List   Diagnosis Date Noted  . Bilateral breast cancer ([place]) 03/24/2020  . Hypertension 10/20/2017  . History of squamous cell carcinoma of skin 09/12/2016  . 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 OSA (obstructive sleep apnea) 01/18/2013  . Adjustment disorder with mixed anxiety and depressed mood 08/17/2012  . Hot flashes 03/23/2012  . GERD 08/28/2009  . Abnormal glucose 08/28/2009  . PARESTHESIA 11/11/2008  . History of colonic polyps 11/11/2008  . Benign paroxysmal positional vertigo 10/25/2008  . Hyperlipemia 09/30/2008  . TOBACCO ABUSE 09/30/2008  . Osteoporosis 09/30/2008    Allyson Sabal [facility] 07/19/2020, 12:01 PM  [place], [place], [address] Phone: [phone]   Fax:  [phone]  Name: TIARAH DESILETS MRN: [id number] Date of Birth: Jul 09, 1956  Manus Gunning, PT 07/19/20 12:01 PM

## 2020-07-20 ENCOUNTER — Ambulatory Visit
Admission: RE | Admit: 2020-07-20 | Discharge: 2020-07-20 | Disposition: A | Discharge status: Home / Self Care | Payer: BC Managed Care – PPO | Source: Ambulatory Visit | Attending: Radiation Oncology | Admitting: Radiation Oncology

## 2020-07-20 ENCOUNTER — Other Ambulatory Visit: Payer: Self-pay

## 2020-07-20 DIAGNOSIS — C50912 Malignant neoplasm of unspecified site of left female breast: Secondary | ICD-10-CM | POA: Diagnosis not present

## 2020-07-20 DIAGNOSIS — C50911 Malignant neoplasm of unspecified site of right female breast: Secondary | ICD-10-CM | POA: Diagnosis not present

## 2020-07-20 DIAGNOSIS — Z170 Estrogen receptor positive status [ER+]: Secondary | ICD-10-CM | POA: Diagnosis not present

## 2020-07-21 ENCOUNTER — Other Ambulatory Visit: Payer: Self-pay

## 2020-07-21 ENCOUNTER — Ambulatory Visit
Admission: RE | Admit: 2020-07-21 | Discharge: 2020-07-21 | Disposition: A | Discharge status: Home / Self Care | Payer: BC Managed Care – PPO | Source: Ambulatory Visit | Attending: Radiation Oncology | Admitting: Radiation Oncology

## 2020-07-21 DIAGNOSIS — Z170 Estrogen receptor positive status [ER+]: Secondary | ICD-10-CM | POA: Diagnosis not present

## 2020-07-21 DIAGNOSIS — C50911 Malignant neoplasm of unspecified site of right female breast: Secondary | ICD-10-CM

## 2020-07-21 DIAGNOSIS — C50912 Malignant neoplasm of unspecified site of left female breast: Secondary | ICD-10-CM | POA: Diagnosis not present

## 2020-07-21 MED ORDER — RADIAPLEXRX EX GEL: Freq: Once | CUTANEOUS | Status: AC

## 2020-07-24 ENCOUNTER — Ambulatory Visit: Payer: BC Managed Care – PPO | Admitting: Physical Therapy

## 2020-07-24 ENCOUNTER — Other Ambulatory Visit: Payer: Self-pay

## 2020-07-24 ENCOUNTER — Encounter: Payer: Self-pay | Admitting: Physical Therapy

## 2020-07-24 ENCOUNTER — Ambulatory Visit
Admission: RE | Admit: 2020-07-24 | Discharge: 2020-07-24 | Disposition: A | Discharge status: Home / Self Care | Payer: BC Managed Care – PPO | Source: Ambulatory Visit | Attending: Radiation Oncology | Admitting: Radiation Oncology

## 2020-07-24 DIAGNOSIS — Z483 Aftercare following surgery for neoplasm: Secondary | ICD-10-CM | POA: Diagnosis not present

## 2020-07-24 DIAGNOSIS — M25611 Stiffness of right shoulder, not elsewhere classified: Secondary | ICD-10-CM | POA: Diagnosis not present

## 2020-07-24 DIAGNOSIS — Z170 Estrogen receptor positive status [ER+]: Secondary | ICD-10-CM | POA: Diagnosis not present

## 2020-07-24 DIAGNOSIS — C50911 Malignant neoplasm of unspecified site of right female breast: Secondary | ICD-10-CM | POA: Diagnosis not present

## 2020-07-24 DIAGNOSIS — C50912 Malignant neoplasm of unspecified site of left female breast: Secondary | ICD-10-CM | POA: Diagnosis not present

## 2020-07-24 DIAGNOSIS — M25612 Stiffness of left shoulder, not elsewhere classified: Secondary | ICD-10-CM

## 2020-07-24 NOTE — Therapy (Signed)
[place], [place], [address] Phone: [phone]   Fax:  [phone]  Physical Therapy Treatment  Patient Details  Name: [name] [name] MRN: [id number] Date of Birth: 11-18-1955 Referring Provider (PT): [name]   Encounter Date: 07/24/2020   PT End of Session - 07/24/20 1209    Visit Number 10    Number of Visits 18    Date for PT Re-Evaluation 07/31/20    PT Start Time 1103    PT Stop Time 1200    PT Time Calculation (min) 57 min    Activity Tolerance Patient tolerated treatment well    Behavior During Therapy [facility] for tasks assessed/performed           Past Medical History:  Diagnosis Date  . Abnormal glucose   . Anxiety    05/05/20  . Arthritis   . Breast cancer ([place]) 03/2020  . Esophageal stricture    Dr.Dora Olevia Perches  . GERD (gastroesophageal reflux disease)   . History of colonic polyps   . Hyperlipidemia   . Hypertension   . MVP (mitral valve prolapse)    history of   . Osteoporosis   . Tobacco abuse    tried Chantix- caused agression    Past Surgical History:  Procedure Laterality Date  . APPENDECTOMY  2008  . AXILLARY SENTINEL NODE BIOPSY Right 05/09/2020   Procedure: RIGHT AXILLARY SENTINEL NODE BIOPSY;  Surgeon: Alphonsa Overall, MD;  Location: [place];  Service: General;  Laterality: Right;  . BREAST BIOPSY  2000  . BREAST BIOPSY Bilateral 2021  . BREAST LUMPECTOMY WITH RADIOACTIVE SEED LOCALIZATION Right 05/09/2020   Procedure: RIGHT BREAST LUMPECTOMY WITH RADIOACTIVE SEED;  Surgeon: Alphonsa Overall, MD;  Location: [place];  Service: General;  Laterality: Right;  . COLONOSCOPY    . ESOPHAGOGASTRODUODENOSCOPY    . MASTECTOMY, PARTIAL Left 05/09/2020   Procedure: LEFT MASECTOMY;  Surgeon: Alphonsa Overall, MD;  Location: [place];  Service: General;  Laterality: Left;  . SENTINEL NODE BIOPSY Left 05/09/2020   Procedure: LEFT AXILLARY SENTINEL NODE BIOPSY;  Surgeon: Alphonsa Overall, MD;   Location: [place];  Service: General;  Laterality: Left;  . 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 vocal cord polyps removed     benigh, Dr Erik Obey    There were no vitals filed for this visit.   Subjective Assessment - 07/24/20 1104    Subjective I started to get a little red so I used a lot of the lotion. I am still having trouble with the same areas feeling tender.    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 No/denies    Pain Score 0-No pain                             [facility] Adult PT Treatment/Exercise - 07/24/20 0001      Shoulder Exercises: Supine   Horizontal ABduction Strengthening;Both;10 reps;Theraband   v/c to not elevate left scapula   Theraband Level (Shoulder Horizontal ABduction) Level 2 (Red)  External Rotation Strengthening;Both;10 reps;Theraband   v/c to engage scapula   Theraband Level (Shoulder External Rotation) Level 2 (Red)    Flexion Strengthening;Both;10 reps;Theraband   v/c to not elevate scapula & to engage scapular muscles   Theraband Level (Shoulder Flexion) Level 2 (Red)    Diagonals Strengthening;Both;10 reps;Theraband   v/c to keep upper arm externally rotated and keep scapula depressed   Theraband Level (Shoulder Diagonals) Level 2 (Red)    Other Supine Exercises lying over foam roll with arms outstretched for pec stretch- stopped due to increased L scapular elevation      Manual Therapy   Soft tissue mobilization to left pec in supine with UE in various degrees of abduction and rotation                       PT Long Term Goals -  07/03/20 1107      PT LONG TERM GOAL #1   Title Pt will return to baseline ROM measurements to allow pt to return to PLOF.    Time 8    Period Weeks    Status Achieved      PT LONG TERM GOAL #2   Title Pt will demonstate 165 degrees of bilateral shoulder flexion to allow her to reach overhead    Baseline R 160, L 144; 07/03/20- R 165 L 168    Time 5    Period Weeks    Status Achieved      PT LONG TERM GOAL #3   Title Pt will demonstrate 165 degrees of bilateral shoulder abduction to allow pt to reach out to the side    Baseline R 140 L 111; 07/03/20- R 178 L 179    Time 5    Period Weeks    Status Achieved      PT LONG TERM GOAL #4   Title Pt will report no increased skin sensitivty at anterior chest to allow improved comfort    Time 5    Period Weeks    Status Achieved      PT LONG TERM GOAL #5   Title Pt will be independent in a home exercise program for long term stretching and strengthening    Time 5    Period Weeks    Status On-going      Additional Long Term Goals   Additional Long Term Goals Yes      PT LONG TERM GOAL #6   Title Pt will report 75% improvement in muscle tightness across in left pec and left lateral trunk to allow improved comfort.    Time 4    Period Weeks    Status New    Target Date 07/31/20                 Plan - 07/24/20 1210    Clinical Impression Statement Pt still having pain in L shoulder with flexion and abduction. Educated pt about importance of increasing strength of scapular muscles to help decrease impingement like pain. Re educated pt in scapular strengthening exercises and had pt return demonstrate all of them and added cues to help pt avoid compensation and scapular elevation. Issued these cues to pt in a handout to follow at home. Completed session by doing soft tissue work across left pec in area of tightness.    PT Frequency 2x / week    PT Duration --   5 weeks   PT Treatment/Interventions ADLs/Self Care Home  Management;Therapeutic exercise;Patient/family education;Manual  lymph drainage;Manual techniques;Taping;Passive range of motion;Scar mobilization    PT Next Visit Plan see how pain has been since theraband ex amendments, Cont pulley and wall exercises assessing scapular engagement, assess indep with scapular strengthening. continue to monitor bilat UE circumferences, cont PROM to bilateral shoulders, MLD to L lateral trunk    PT Home Exercise Plan post op breast exercises., supine dowel exercises; end ROM stretching in doorway, supine scap    Consulted and Agree with Plan of Care Patient           Patient will benefit from skilled therapeutic intervention in order to improve the following deficits and impairments:  Postural dysfunction,Decreased knowledge of precautions,Impaired flexibility,Impaired UE functional use,Increased fascial restricitons,Decreased strength,Decreased range of motion,Decreased scar mobility,Increased edema  Visit Diagnosis: Stiffness of left shoulder, not elsewhere classified  Aftercare following surgery for neoplasm  Stiffness of right shoulder, not elsewhere classified     Problem List Patient Active Problem List   Diagnosis Date Noted  . Bilateral breast cancer ([place]) 03/24/2020  . Hypertension 10/20/2017  . History of squamous cell carcinoma of skin 09/12/2016  . 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 OSA (obstructive sleep apnea) 01/18/2013  . Adjustment disorder with mixed anxiety and depressed mood 08/17/2012  . Hot flashes 03/23/2012  . GERD 08/28/2009  . Abnormal glucose 08/28/2009  . PARESTHESIA 11/11/2008  . History of colonic polyps 11/11/2008  . Benign paroxysmal positional vertigo 10/25/2008  . Hyperlipemia 09/30/2008  . TOBACCO ABUSE 09/30/2008  . Osteoporosis 09/30/2008    Allyson Sabal [facility] 07/24/2020, 12:18 PM  [place], [place],  [address] Phone: [phone]   Fax:  [phone]  Name: [name] [name] MRN: [id number] Date of Birth: April 05, 1956  Manus Gunning, PT 07/24/20 12:18 PM

## 2020-07-24 NOTE — Patient Instructions (Signed)
Narrow grip with hands close together: keep shoulders from elevating, squeeze shoulders together and down, don't put tension on the band  Wide grip on theraband: don't put tension on the band, externally rotate upper arm as you go back, watch for arm shakiness and do not go far past that point  Pulling band across chest like a "t"- be sure not to elevate left shoulder  External rotation - keep elbows tucked at sides  Diagonal pull- stabilize on opposite side by pressing opposite side in to the mat, be sure not to elevate shoulder when bringing arm back, thumb is pointed down when by hip and rotates upwards when by head

## 2020-07-25 ENCOUNTER — Ambulatory Visit
Admission: RE | Admit: 2020-07-25 | Discharge: 2020-07-25 | Disposition: A | Discharge status: Home / Self Care | Payer: BC Managed Care – PPO | Source: Ambulatory Visit | Attending: Radiation Oncology | Admitting: Radiation Oncology

## 2020-07-25 DIAGNOSIS — Z170 Estrogen receptor positive status [ER+]: Secondary | ICD-10-CM | POA: Diagnosis not present

## 2020-07-25 DIAGNOSIS — C50911 Malignant neoplasm of unspecified site of right female breast: Secondary | ICD-10-CM | POA: Diagnosis not present

## 2020-07-25 DIAGNOSIS — C50912 Malignant neoplasm of unspecified site of left female breast: Secondary | ICD-10-CM | POA: Diagnosis not present

## 2020-07-26 ENCOUNTER — Ambulatory Visit: Payer: BC Managed Care – PPO | Admitting: Physical Therapy

## 2020-07-26 ENCOUNTER — Ambulatory Visit
Admission: RE | Admit: 2020-07-26 | Discharge: 2020-07-26 | Disposition: A | Discharge status: Home / Self Care | Payer: BC Managed Care – PPO | Source: Ambulatory Visit | Attending: Radiation Oncology | Admitting: Radiation Oncology

## 2020-07-26 ENCOUNTER — Other Ambulatory Visit: Payer: Self-pay

## 2020-07-26 DIAGNOSIS — Z170 Estrogen receptor positive status [ER+]: Secondary | ICD-10-CM | POA: Diagnosis not present

## 2020-07-26 DIAGNOSIS — C50912 Malignant neoplasm of unspecified site of left female breast: Secondary | ICD-10-CM | POA: Diagnosis not present

## 2020-07-26 DIAGNOSIS — C50911 Malignant neoplasm of unspecified site of right female breast: Secondary | ICD-10-CM | POA: Diagnosis not present

## 2020-07-27 ENCOUNTER — Other Ambulatory Visit: Payer: Self-pay

## 2020-07-27 ENCOUNTER — Ambulatory Visit
Admission: RE | Admit: 2020-07-27 | Discharge: 2020-07-27 | Disposition: A | Discharge status: Home / Self Care | Payer: BC Managed Care – PPO | Source: Ambulatory Visit | Attending: Radiation Oncology | Admitting: Radiation Oncology

## 2020-07-27 DIAGNOSIS — C50912 Malignant neoplasm of unspecified site of left female breast: Secondary | ICD-10-CM | POA: Diagnosis not present

## 2020-07-27 DIAGNOSIS — Z170 Estrogen receptor positive status [ER+]: Secondary | ICD-10-CM | POA: Diagnosis not present

## 2020-07-27 DIAGNOSIS — C50911 Malignant neoplasm of unspecified site of right female breast: Secondary | ICD-10-CM | POA: Diagnosis not present

## 2020-07-28 ENCOUNTER — Ambulatory Visit
Admission: RE | Admit: 2020-07-28 | Discharge: 2020-07-28 | Disposition: A | Discharge status: Home / Self Care | Payer: BC Managed Care – PPO | Source: Ambulatory Visit | Attending: Radiation Oncology | Admitting: Radiation Oncology

## 2020-07-28 ENCOUNTER — Other Ambulatory Visit: Payer: Self-pay

## 2020-07-28 DIAGNOSIS — C50911 Malignant neoplasm of unspecified site of right female breast: Secondary | ICD-10-CM | POA: Diagnosis not present

## 2020-07-28 DIAGNOSIS — C50912 Malignant neoplasm of unspecified site of left female breast: Secondary | ICD-10-CM | POA: Diagnosis not present

## 2020-07-28 DIAGNOSIS — Z170 Estrogen receptor positive status [ER+]: Secondary | ICD-10-CM | POA: Diagnosis not present

## 2020-07-28 MED ORDER — RADIAPLEXRX EX GEL: Freq: Once | CUTANEOUS | Status: AC

## 2020-07-31 ENCOUNTER — Other Ambulatory Visit: Payer: Self-pay

## 2020-07-31 ENCOUNTER — Ambulatory Visit: Payer: BC Managed Care – PPO | Admitting: Physical Therapy

## 2020-07-31 ENCOUNTER — Ambulatory Visit
Admission: RE | Admit: 2020-07-31 | Discharge: 2020-07-31 | Disposition: A | Discharge status: Home / Self Care | Payer: BC Managed Care – PPO | Source: Ambulatory Visit | Attending: Radiation Oncology | Admitting: Radiation Oncology

## 2020-07-31 DIAGNOSIS — C50911 Malignant neoplasm of unspecified site of right female breast: Secondary | ICD-10-CM | POA: Diagnosis not present

## 2020-07-31 DIAGNOSIS — C50912 Malignant neoplasm of unspecified site of left female breast: Secondary | ICD-10-CM | POA: Diagnosis not present

## 2020-07-31 DIAGNOSIS — Z170 Estrogen receptor positive status [ER+]: Secondary | ICD-10-CM | POA: Diagnosis not present

## 2020-08-01 ENCOUNTER — Other Ambulatory Visit: Payer: Self-pay

## 2020-08-01 ENCOUNTER — Ambulatory Visit
Admission: RE | Admit: 2020-08-01 | Discharge: 2020-08-01 | Disposition: A | Discharge status: Home / Self Care | Payer: BC Managed Care – PPO | Source: Ambulatory Visit | Attending: Radiation Oncology | Admitting: Radiation Oncology

## 2020-08-01 DIAGNOSIS — Z170 Estrogen receptor positive status [ER+]: Secondary | ICD-10-CM | POA: Diagnosis not present

## 2020-08-01 DIAGNOSIS — C50911 Malignant neoplasm of unspecified site of right female breast: Secondary | ICD-10-CM | POA: Diagnosis not present

## 2020-08-01 DIAGNOSIS — C50912 Malignant neoplasm of unspecified site of left female breast: Secondary | ICD-10-CM | POA: Diagnosis not present

## 2020-08-02 ENCOUNTER — Ambulatory Visit: Payer: BC Managed Care – PPO | Admitting: Physical Therapy

## 2020-08-02 ENCOUNTER — Ambulatory Visit
Admission: RE | Admit: 2020-08-02 | Discharge: 2020-08-02 | Disposition: A | Discharge status: Home / Self Care | Payer: BC Managed Care – PPO | Source: Ambulatory Visit | Attending: Radiation Oncology | Admitting: Radiation Oncology

## 2020-08-02 ENCOUNTER — Other Ambulatory Visit: Payer: Self-pay

## 2020-08-02 DIAGNOSIS — C50911 Malignant neoplasm of unspecified site of right female breast: Secondary | ICD-10-CM | POA: Diagnosis not present

## 2020-08-02 DIAGNOSIS — C50912 Malignant neoplasm of unspecified site of left female breast: Secondary | ICD-10-CM | POA: Diagnosis not present

## 2020-08-02 DIAGNOSIS — Z170 Estrogen receptor positive status [ER+]: Secondary | ICD-10-CM | POA: Diagnosis not present

## 2020-08-03 ENCOUNTER — Ambulatory Visit
Admission: RE | Admit: 2020-08-03 | Discharge: 2020-08-03 | Disposition: A | Discharge status: Home / Self Care | Payer: BC Managed Care – PPO | Source: Ambulatory Visit | Attending: Radiation Oncology | Admitting: Radiation Oncology

## 2020-08-03 DIAGNOSIS — Z170 Estrogen receptor positive status [ER+]: Secondary | ICD-10-CM | POA: Diagnosis not present

## 2020-08-03 DIAGNOSIS — C50912 Malignant neoplasm of unspecified site of left female breast: Secondary | ICD-10-CM | POA: Diagnosis not present

## 2020-08-03 DIAGNOSIS — C50911 Malignant neoplasm of unspecified site of right female breast: Secondary | ICD-10-CM | POA: Diagnosis not present

## 2020-08-04 ENCOUNTER — Ambulatory Visit
Admission: RE | Admit: 2020-08-04 | Discharge: 2020-08-04 | Disposition: A | Discharge status: Home / Self Care | Payer: BC Managed Care – PPO | Source: Ambulatory Visit | Attending: Radiation Oncology | Admitting: Radiation Oncology

## 2020-08-04 ENCOUNTER — Other Ambulatory Visit: Payer: Self-pay

## 2020-08-04 DIAGNOSIS — C50912 Malignant neoplasm of unspecified site of left female breast: Secondary | ICD-10-CM | POA: Diagnosis not present

## 2020-08-04 DIAGNOSIS — C50911 Malignant neoplasm of unspecified site of right female breast: Secondary | ICD-10-CM | POA: Diagnosis not present

## 2020-08-04 DIAGNOSIS — Z170 Estrogen receptor positive status [ER+]: Secondary | ICD-10-CM | POA: Diagnosis not present

## 2020-08-06 NOTE — Progress Notes (Signed)
Patient Care Team: [name], MD as PCP - General (Family Medicine) Mauro Kaufmann, RN as Oncology Nurse Navigator Rockwell Germany, RN as Oncology Nurse Navigator Nicholas Lose, MD as Consulting Physician (Hematology and Oncology) Kyung Rudd, MD as Consulting Physician (Radiation Oncology) Alphonsa Overall, MD as Consulting Physician (General Surgery)  DIAGNOSIS:    ICD-10-CM   1. Bilateral malignant neoplasm of breast in female, unspecified estrogen receptor status, unspecified site of breast ([place])  C50.911    C50.912     SUMMARY OF ONCOLOGIC HISTORY: Oncology History  Bilateral breast cancer ([place])  03/15/2020 Initial Diagnosis   Right breast, 0.9cm right breast mass at the 12 o'clock position, and no axillary adenopathy. grade 1 invasive ductal carcinoma with high grade DCIS, HER-2 equivocal by IHC, negative by FISH, ER+ 100%, PR+ 100%, Ki67 2% T1BN0 stage Ia left breast: 2.5cm mass in the left breast at the 1 o'clock position with likely skin involvement in the nipple-areola complex, Invasive lobular carcinoma, grade 2, HER-2 negative (1+), ER+ 80%, PR+ 50%, Ki67 2%. T4N0 stage IIIb   05/09/2020 Surgery   Right lumpectomy and left mastectomy [name]): Right breast: invasive and in situ ductal carcinoma, 1.0cm, clear margins, 2 right axillary lymph nodes negative for carcinoma Left breast: invasive and in situ lobular carcinoma, 6.0cm, clear margins, 1/2 left axillary lymph nodes with isolated tumor cells.   05/09/2020 Cancer Staging   Staging form: Breast, AJCC 8th Edition - Pathologic stage from 05/09/2020: Stage IA (pT1b, pN0, cM0, G1, ER+, PR+, HER2-) - Signed by Gardenia Phlegm, NP on 05/24/2020   05/18/2020 Cancer Staging   Staging form: Breast, AJCC 8th Edition - Pathologic stage from 05/18/2020: Stage IIIA (pT4, pN0(mol+), cM0, G2, ER+, PR+, HER2-) - Signed by Gardenia Phlegm, NP on 05/24/2020   06/15/2020 Oncotype testing   Oncotype score: 2,  distant recurrence at 9 years: 3%   07/03/2020 -  Radiation Therapy   Adjuvant radiation     CHIEF COMPLIANT: Follow-up to discuss antiestrogen therapy.  INTERVAL HISTORY: [name] [name] is a 65 y.o. with above-mentioned history of bilateral breast cancer who underwent a right lumpectomy and left mastectomy and is currently on radiation. She presents to the clinic today to discuss antiestrogen therapy.   ALLERGIES:  is allergic to penicillins.  MEDICATIONS:  Current Outpatient Medications  Medication Sig Dispense Refill  . acetaminophen (TYLENOL) 500 MG tablet Take 500 mg by mouth every 6 (six) hours as needed.    Marland Kitchen albuterol (VENTOLIN HFA) 108 (90 Base) MCG/ACT inhaler INHALE 2 PUFFS BY MOUTH EVERY 6 HOURS AS NEEDED (Patient taking differently: Inhale 2 puffs into the lungs every 6 (six) hours as needed for wheezing or shortness of breath. ) 8.5 g 2  . amLODipine (NORVASC) 2.5 MG tablet Take 1 tablet (2.5 mg total) by mouth daily. (Patient taking differently: Take 2.5 mg by mouth every morning. ) 90 tablet 3  . atorvastatin (LIPITOR) 40 MG tablet Take 1 tablet (40 mg total) by mouth daily. (Patient taking differently: Take 40 mg by mouth every morning. ) 90 tablet 3  . azelastine (ASTELIN) 0.1 % nasal spray Place 2 sprays into both nostrils 2 (two) times daily. (Patient taking differently: Place 2 sprays into both nostrils 2 (two) times daily as needed (congestion/allergies.). ) 30 mL 12  . citalopram (CELEXA) 20 MG tablet Take 1 tablet (20 mg total) by mouth daily. (Patient taking differently: Take 20 mg by mouth every morning. ) 90 tablet 3  .  esomeprazole (NEXIUM) 40 MG capsule TAKE 1 CAPSULE BY MOUTH TWICE A DAY BEFORE A MEAL (Patient taking differently: Take 40 mg by mouth in the morning and at bedtime. TAKE 1 CAPSULE BY MOUTH TWICE A DAY BEFORE A MEAL  >>>>>>CAN ONLY TAKE NEXIUM - BRAND NAME - NO GENERIC<<<<<) 180 capsule 3  . HYDROcodone-acetaminophen (NORCO/VICODIN) 5-325 MG tablet  Take 1 tablet by mouth every 6 (six) hours as needed for moderate pain. 15 tablet 0   No current facility-administered medications for this visit.    PHYSICAL EXAMINATION: ECOG PERFORMANCE STATUS: 1 - Symptomatic but completely ambulatory  Vitals:   08/07/20 1417  BP: (!) 159/69  Pulse: 72  Resp: 18  Temp: (!) 97.5 F (36.4 C)  SpO2: 96%   Filed Weights   08/07/20 1417  Weight: 196 lb 3.2 oz (89 kg)    LABORATORY DATA:  I have reviewed the data as listed CMP Latest Ref Rng & Units 05/05/2020 04/06/2020 12/18/2017  Glucose 70 - 99 mg/dL 112(H) 114(H) 85  BUN 8 - 23 mg/dL 7(L) 7(L) 13  Creatinine 0.44 - 1.00 mg/dL 0.59 0.72 0.61  Sodium 135 - 145 mmol/L 141 141 140  Potassium 3.5 - 5.1 mmol/L 3.7 3.8 4.2  Chloride 98 - 111 mmol/L 104 104 100  CO2 22 - 32 mmol/L '27 30 27  ' Calcium 8.9 - 10.3 mg/dL 9.5 9.7 9.9  Total Protein 6.5 - 8.1 g/dL - 7.6 7.4  Total Bilirubin 0.3 - 1.2 mg/dL - 0.6 0.4  Alkaline Phos 38 - 126 U/L - 126 111  AST 15 - 41 U/L - 12(L) 13  ALT 0 - 44 U/L - 13 14    Lab Results  Component Value Date   WBC 9.3 05/05/2020   HGB 15.5 (H) 05/05/2020   HCT 46.9 (H) 05/05/2020   MCV 91.2 05/05/2020   PLT 197 05/05/2020   NEUTROABS 6.4 07/10/2016    ASSESSMENT & PLAN:  Bilateral breast cancer ([place]) Right lumpectomy and left mastectomy [name]): 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 grade 2, HER-2 negative (1+), ER+ 80%, PR+ 50%, Ki67 2%. T4N0 stage IIIA  Pathology counseling: I discussed the final pathology report of the patient provided  a copy of this report. I discussed the margins as well as lymph node surgeries. We also discussed the final staging along with previously  performed ER/PR and HER-2/neu testing.  CT CAP 04/06/2020: Stomach wall thickening?  Gastritis, small pulmonary nodules 4 mm and 3 mm, hepatomegaly with suspicion for portal gastropathy however no frank signs of cirrhosis.  No metastatic disease Bone scan 04/06/2020: No bone metastatic disease.   Oncotype DX: 2, distant recurrence at 9 years: 3%  Treatment plan: 1. Adjuvant radiation therapy   07/03/2020-08/21/2020 2. Adjuvant antiestrogen therapy with anastrozole 1 mg daily x7 years  Anastrozole counseling:We discussed the risks and benefits of anti-estrogen therapy with aromatase inhibitors. These include but not limited to insomnia, hot flashes, mood changes, vaginal dryness, bone density loss, and weight gain. We strongly believe that the benefits far outweigh the risks. Patient understands these risks and consented to starting treatment. Planned treatment duration is 7 years.  Return to clinic in 3 months for survivorship care plan visit  No orders of the defined types were placed in this encounter.  The patient has a good understanding of the overall plan. she agrees with it. she will call with any problems that may develop before the next visit here.  Total time spent: 30 mins including face to face time and time spent for planning, charting and coordination of care  Nicholas Lose, MD 08/07/2020  I, Cloyde Reams Dorshimer, am acting as scribe for Dr. Nicholas Lose.  I have reviewed the above documentation for accuracy and completeness, and I agree with the above.

## 2020-08-07 ENCOUNTER — Ambulatory Visit
Admission: RE | Admit: 2020-08-07 | Discharge: 2020-08-07 | Disposition: A | Discharge status: Home / Self Care | Payer: BC Managed Care – PPO | Source: Ambulatory Visit | Attending: Radiation Oncology | Admitting: Radiation Oncology

## 2020-08-07 ENCOUNTER — Ambulatory Visit: Payer: BC Managed Care – PPO | Admitting: Physical Therapy

## 2020-08-07 ENCOUNTER — Other Ambulatory Visit: Payer: Self-pay

## 2020-08-07 ENCOUNTER — Inpatient Hospital Stay: Payer: BC Managed Care – PPO | Attending: Hematology and Oncology | Admitting: Hematology and Oncology

## 2020-08-07 ENCOUNTER — Telehealth: Payer: Self-pay | Admitting: Hematology and Oncology

## 2020-08-07 DIAGNOSIS — C50812 Malignant neoplasm of overlapping sites of left female breast: Secondary | ICD-10-CM | POA: Diagnosis not present

## 2020-08-07 DIAGNOSIS — K2970 Gastritis, unspecified, without bleeding: Secondary | ICD-10-CM | POA: Insufficient documentation

## 2020-08-07 DIAGNOSIS — Z9012 Acquired absence of left breast and nipple: Secondary | ICD-10-CM | POA: Insufficient documentation

## 2020-08-07 DIAGNOSIS — C50912 Malignant neoplasm of unspecified site of left female breast: Secondary | ICD-10-CM | POA: Diagnosis not present

## 2020-08-07 DIAGNOSIS — R918 Other nonspecific abnormal finding of lung field: Secondary | ICD-10-CM | POA: Diagnosis not present

## 2020-08-07 DIAGNOSIS — Z170 Estrogen receptor positive status [ER+]: Secondary | ICD-10-CM | POA: Insufficient documentation

## 2020-08-07 DIAGNOSIS — C50412 Malignant neoplasm of upper-outer quadrant of left female breast: Secondary | ICD-10-CM | POA: Insufficient documentation

## 2020-08-07 DIAGNOSIS — C50811 Malignant neoplasm of overlapping sites of right female breast: Secondary | ICD-10-CM | POA: Diagnosis not present

## 2020-08-07 DIAGNOSIS — C50911 Malignant neoplasm of unspecified site of right female breast: Secondary | ICD-10-CM

## 2020-08-07 DIAGNOSIS — Z923 Personal history of irradiation: Secondary | ICD-10-CM | POA: Insufficient documentation

## 2020-08-07 MED ORDER — ANASTROZOLE 1 MG PO TABS: 1.0000 mg | ORAL_TABLET | Freq: Every day | ORAL | 3 refills | Status: DC

## 2020-08-07 NOTE — Assessment & Plan Note (Signed)
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 grade 2, HER-2 negative (1+), ER+ 80%, PR+ 50%, Ki67 2%. T4N0 stage IIIA  Pathology counseling: I discussed the final pathology report of the patient provided  a copy of this report. I discussed the margins as well as lymph node surgeries. We also discussed the final staging along with previously performed ER/PR and HER-2/neu testing.  CT CAP 04/06/2020: Stomach wall thickening?  Gastritis, small pulmonary nodules 4 mm and 3 mm, hepatomegaly with suspicion for portal gastropathy however no frank signs of cirrhosis.  No metastatic disease Bone scan 04/06/2020: No bone metastatic disease.   Oncotype DX: 2, distant recurrence at 9 years: 3%  Treatment plan: 1. Adjuvant radiation therapy   07/03/2020-08/21/2020 2. Adjuvant antiestrogen therapy with anastrozole 1 mg daily x7 years  Anastrozole counseling:We discussed the risks and benefits of anti-estrogen therapy with aromatase inhibitors. These include but not limited to insomnia, hot flashes, mood changes, vaginal dryness, bone density loss, and weight gain. We strongly believe that the benefits far outweigh the risks. Patient understands these risks and consented to starting treatment. Planned treatment duration is 7 years.  Return to clinic in 3 months for survivorship care plan visit 

## 2020-08-07 NOTE — Telephone Encounter (Signed)
Scheduled appointment per 1/24 los. Spoke to patient who is aware of appointment date and time. Gave patient calendar print out.

## 2020-08-08 ENCOUNTER — Encounter: Payer: Self-pay | Admitting: Radiation Oncology

## 2020-08-08 ENCOUNTER — Ambulatory Visit
Admission: RE | Admit: 2020-08-08 | Discharge: 2020-08-08 | Disposition: A | Discharge status: Home / Self Care | Payer: BC Managed Care – PPO | Source: Ambulatory Visit | Attending: Radiation Oncology | Admitting: Radiation Oncology

## 2020-08-08 DIAGNOSIS — Z170 Estrogen receptor positive status [ER+]: Secondary | ICD-10-CM | POA: Diagnosis not present

## 2020-08-08 DIAGNOSIS — C50911 Malignant neoplasm of unspecified site of right female breast: Secondary | ICD-10-CM | POA: Diagnosis not present

## 2020-08-08 DIAGNOSIS — C50912 Malignant neoplasm of unspecified site of left female breast: Secondary | ICD-10-CM | POA: Diagnosis not present

## 2020-08-09 ENCOUNTER — Ambulatory Visit: Payer: BC Managed Care – PPO | Admitting: Physical Therapy

## 2020-08-09 ENCOUNTER — Ambulatory Visit
Admission: RE | Admit: 2020-08-09 | Discharge: 2020-08-09 | Disposition: A | Discharge status: Home / Self Care | Payer: BC Managed Care – PPO | Source: Ambulatory Visit | Attending: Radiation Oncology | Admitting: Radiation Oncology

## 2020-08-09 DIAGNOSIS — C50912 Malignant neoplasm of unspecified site of left female breast: Secondary | ICD-10-CM | POA: Diagnosis not present

## 2020-08-09 DIAGNOSIS — Z170 Estrogen receptor positive status [ER+]: Secondary | ICD-10-CM | POA: Diagnosis not present

## 2020-08-09 DIAGNOSIS — C50911 Malignant neoplasm of unspecified site of right female breast: Secondary | ICD-10-CM | POA: Diagnosis not present

## 2020-08-10 ENCOUNTER — Ambulatory Visit
Admission: RE | Admit: 2020-08-10 | Discharge: 2020-08-10 | Disposition: A | Discharge status: Home / Self Care | Payer: BC Managed Care – PPO | Source: Ambulatory Visit | Attending: Radiation Oncology | Admitting: Radiation Oncology

## 2020-08-10 ENCOUNTER — Other Ambulatory Visit: Payer: Self-pay

## 2020-08-10 DIAGNOSIS — C50911 Malignant neoplasm of unspecified site of right female breast: Secondary | ICD-10-CM | POA: Diagnosis not present

## 2020-08-10 DIAGNOSIS — C50912 Malignant neoplasm of unspecified site of left female breast: Secondary | ICD-10-CM | POA: Diagnosis not present

## 2020-08-10 DIAGNOSIS — Z170 Estrogen receptor positive status [ER+]: Secondary | ICD-10-CM | POA: Diagnosis not present

## 2020-08-10 NOTE — Progress Notes (Signed)
  Radiation Oncology         [phone] ________________________________  Name: [name] [name] MRN: [id number]  Date: 08/08/2020  DOB: January 26, 1956  SIMULATION NOTE   NARRATIVE:  The patient underwent simulation today for ongoing radiation therapy.  The existing CT study set was employed for the purpose of virtual treatment planning.  The target and avoidance structures were reviewed and modified as necessary.  Treatment planning then occurred.  The radiation boost prescription was entered and confirmed.  A total of 3 complex treatment devices were fabricated in the form of multi-leaf collimators to shape radiation around the targets while maximally excluding nearby normal structures. I have requested : Isodose Plan.    PLAN:  This modified radiation beam arrangement is intended to continue the current radiation dose to an additional 10 Gy in 5 fractions for a total cumulative dose of 60.4 Gy.    ------------------------------------------------  Jodelle Gross, MD, PhD

## 2020-08-11 ENCOUNTER — Ambulatory Visit
Admission: RE | Admit: 2020-08-11 | Discharge: 2020-08-11 | Disposition: A | Discharge status: Home / Self Care | Payer: BC Managed Care – PPO | Source: Ambulatory Visit | Attending: Radiation Oncology | Admitting: Radiation Oncology

## 2020-08-11 ENCOUNTER — Ambulatory Visit: Payer: BC Managed Care – PPO | Admitting: Radiation Oncology

## 2020-08-11 DIAGNOSIS — Z170 Estrogen receptor positive status [ER+]: Secondary | ICD-10-CM | POA: Diagnosis not present

## 2020-08-11 DIAGNOSIS — C50911 Malignant neoplasm of unspecified site of right female breast: Secondary | ICD-10-CM

## 2020-08-11 DIAGNOSIS — C50912 Malignant neoplasm of unspecified site of left female breast: Secondary | ICD-10-CM | POA: Diagnosis not present

## 2020-08-11 MED ORDER — SONAFINE EX EMUL
1.0000 "application " | Freq: Two times a day (BID) | CUTANEOUS | Status: DC
  Administered 2020-08-11: 1 via TOPICAL

## 2020-08-14 ENCOUNTER — Ambulatory Visit: Payer: BC Managed Care – PPO | Admitting: Radiation Oncology

## 2020-08-14 ENCOUNTER — Ambulatory Visit
Admission: RE | Admit: 2020-08-14 | Discharge: 2020-08-14 | Disposition: A | Discharge status: Home / Self Care | Payer: BC Managed Care – PPO | Source: Ambulatory Visit | Attending: Radiation Oncology | Admitting: Radiation Oncology

## 2020-08-14 ENCOUNTER — Encounter: Payer: BC Managed Care – PPO | Admitting: Physical Therapy

## 2020-08-14 DIAGNOSIS — C50912 Malignant neoplasm of unspecified site of left female breast: Secondary | ICD-10-CM | POA: Diagnosis not present

## 2020-08-14 DIAGNOSIS — Z170 Estrogen receptor positive status [ER+]: Secondary | ICD-10-CM | POA: Diagnosis not present

## 2020-08-14 DIAGNOSIS — C50911 Malignant neoplasm of unspecified site of right female breast: Secondary | ICD-10-CM | POA: Diagnosis not present

## 2020-08-15 ENCOUNTER — Ambulatory Visit
Admission: RE | Admit: 2020-08-15 | Discharge: 2020-08-15 | Disposition: A | Discharge status: Home / Self Care | Payer: BC Managed Care – PPO | Source: Ambulatory Visit | Attending: Radiation Oncology | Admitting: Radiation Oncology

## 2020-08-15 ENCOUNTER — Ambulatory Visit: Payer: BC Managed Care – PPO

## 2020-08-15 DIAGNOSIS — Z170 Estrogen receptor positive status [ER+]: Secondary | ICD-10-CM | POA: Diagnosis not present

## 2020-08-15 DIAGNOSIS — C50912 Malignant neoplasm of unspecified site of left female breast: Secondary | ICD-10-CM | POA: Insufficient documentation

## 2020-08-15 DIAGNOSIS — C50911 Malignant neoplasm of unspecified site of right female breast: Secondary | ICD-10-CM | POA: Diagnosis not present

## 2020-08-16 ENCOUNTER — Other Ambulatory Visit: Payer: Self-pay

## 2020-08-16 ENCOUNTER — Ambulatory Visit: Payer: BC Managed Care – PPO

## 2020-08-16 ENCOUNTER — Ambulatory Visit
Admission: RE | Admit: 2020-08-16 | Discharge: 2020-08-16 | Disposition: A | Discharge status: Home / Self Care | Payer: BC Managed Care – PPO | Source: Ambulatory Visit | Attending: Radiation Oncology | Admitting: Radiation Oncology

## 2020-08-16 DIAGNOSIS — C50912 Malignant neoplasm of unspecified site of left female breast: Secondary | ICD-10-CM | POA: Diagnosis not present

## 2020-08-16 DIAGNOSIS — Z170 Estrogen receptor positive status [ER+]: Secondary | ICD-10-CM | POA: Diagnosis not present

## 2020-08-16 DIAGNOSIS — C50911 Malignant neoplasm of unspecified site of right female breast: Secondary | ICD-10-CM | POA: Diagnosis not present

## 2020-08-17 ENCOUNTER — Other Ambulatory Visit: Payer: Self-pay

## 2020-08-17 ENCOUNTER — Encounter: Payer: Self-pay | Admitting: *Deleted

## 2020-08-17 ENCOUNTER — Ambulatory Visit
Admission: RE | Admit: 2020-08-17 | Discharge: 2020-08-17 | Disposition: A | Discharge status: Home / Self Care | Payer: BC Managed Care – PPO | Source: Ambulatory Visit | Attending: Radiation Oncology | Admitting: Radiation Oncology

## 2020-08-17 DIAGNOSIS — Z170 Estrogen receptor positive status [ER+]: Secondary | ICD-10-CM | POA: Diagnosis not present

## 2020-08-17 DIAGNOSIS — C50911 Malignant neoplasm of unspecified site of right female breast: Secondary | ICD-10-CM | POA: Diagnosis not present

## 2020-08-17 DIAGNOSIS — C50912 Malignant neoplasm of unspecified site of left female breast: Secondary | ICD-10-CM | POA: Diagnosis not present

## 2020-08-18 ENCOUNTER — Ambulatory Visit
Admission: RE | Admit: 2020-08-18 | Discharge: 2020-08-18 | Disposition: A | Discharge status: Home / Self Care | Payer: BC Managed Care – PPO | Source: Ambulatory Visit | Attending: Radiation Oncology | Admitting: Radiation Oncology

## 2020-08-18 ENCOUNTER — Ambulatory Visit: Payer: BC Managed Care – PPO

## 2020-08-18 ENCOUNTER — Other Ambulatory Visit: Payer: Self-pay

## 2020-08-18 DIAGNOSIS — C50912 Malignant neoplasm of unspecified site of left female breast: Secondary | ICD-10-CM | POA: Diagnosis not present

## 2020-08-18 DIAGNOSIS — Z170 Estrogen receptor positive status [ER+]: Secondary | ICD-10-CM | POA: Diagnosis not present

## 2020-08-18 DIAGNOSIS — C50911 Malignant neoplasm of unspecified site of right female breast: Secondary | ICD-10-CM | POA: Diagnosis not present

## 2020-08-21 ENCOUNTER — Other Ambulatory Visit: Payer: Self-pay

## 2020-08-21 ENCOUNTER — Ambulatory Visit: Payer: BC Managed Care – PPO

## 2020-08-21 ENCOUNTER — Encounter: Payer: Self-pay | Admitting: Radiation Oncology

## 2020-08-21 ENCOUNTER — Ambulatory Visit
Admission: RE | Admit: 2020-08-21 | Discharge: 2020-08-21 | Disposition: A | Discharge status: Home / Self Care | Payer: BC Managed Care – PPO | Source: Ambulatory Visit | Attending: Radiation Oncology | Admitting: Radiation Oncology

## 2020-08-21 DIAGNOSIS — C50911 Malignant neoplasm of unspecified site of right female breast: Secondary | ICD-10-CM | POA: Diagnosis not present

## 2020-08-21 DIAGNOSIS — Z170 Estrogen receptor positive status [ER+]: Secondary | ICD-10-CM | POA: Diagnosis not present

## 2020-08-21 DIAGNOSIS — C50912 Malignant neoplasm of unspecified site of left female breast: Secondary | ICD-10-CM | POA: Diagnosis not present

## 2020-08-22 ENCOUNTER — Ambulatory Visit: Payer: BC Managed Care – PPO

## 2020-08-23 NOTE — Progress Notes (Signed)
  Patient Name: [name] [name] MRN: [id number] DOB: [birth year] Referring Physician: [name] (Profile Not Attached) Date of Service: 08/21/2020 [facility], [place]                                                        End Of Treatment Note  Diagnoses: C50.911-Malignant neoplasm of unspecified site of right female breast C50.912-Malignant neoplasm of unspecified site of left female breast  Cancer Staging: Stage IIIA, pT4N0(mol+)M0 grade 2, ER/PR positive invasive lobular carcinoma of the left breast, and a synchronous Stage IA, pT1bN0M0 grade 1 invasive ductal carcinoma of the right breast.  Intent: Curative  Radiation Treatment Dates: 07/03/2020 through 08/21/2020 Site Technique Total Dose (Gy) Dose per Fx (Gy) Completed Fx Beam Energies  Chest Wall, Left: CW_Lt 3D 50.4/50.4 1.8 28/28 10X  Chest Wall, Left: CW_Lt_SCLV 3D 50.4/50.4 1.8 28/28 6X, 10X  Chest Wall, Left: CW_Lt_Bst Electron 10/10 2 5/5 9E  Breast, Right: Breast_Rt 3D 50.4/50.4 1.8 28/28 6X  Breast, Right: Breast_Rt_Bst 3D 10/10 2 5/5 6X   Narrative: The patient tolerated radiation therapy relatively well. She did have some hyperpigmentation but no moist desquamation at the conclusion of her treatment.  Plan: The patient will receive a call in about one month from the radiation oncology department. She will continue follow up with Dr. Lindi Adie as well.   ________________________________________________    Carola Rhine, [facility]

## 2020-09-05 ENCOUNTER — Ambulatory Visit: Payer: BC Managed Care – PPO | Admitting: Physical Therapy

## 2020-09-06 NOTE — Progress Notes (Incomplete)
  Radiation Oncology         [phone] ________________________________  Name: [name] [name] MRN: [id number]  Date of Service: 09/18/2020  DOB: [birth year]  Post Treatment Telephone Note  Diagnosis:   C50.911-Malignant neoplasm of unspecified site of right female breast C50.912-Malignant neoplasm of unspecified site of left female breast  Interval Since Last Radiation:  7 weeks Radiation Treatment Dates: 07/03/2020 through 08/21/2020 Site Technique Total Dose (Gy) Dose per Fx (Gy) Completed Fx Beam Energies  Chest Wall, Left: CW_Lt 3D 50.4/50.4 1.8 28/28 10X  Chest Wall, Left: CW_Lt_SCLV 3D 50.4/50.4 1.8 28/28 6X, 10X  Chest Wall, Left: CW_Lt_Bst Electron 10/10 2 5/5 9E  Breast, Right: Breast_Rt 3D 50.4/50.4 1.8 28/28 6X  Breast, Right: Breast_Rt_Bst 3D 10/10 2 5/5 6X       Narrative:  The patient was contacted today for routine follow-up. During treatment she did very well with radiotherapy and did not have significant desquamation. She reports she ***.  Impression/Plan: 1. Malignant neoplasm of unspecified site of right female breast Malignant neoplasm of unspecified site of left female breast. The patient has been doing well since completion of radiotherapy. We discussed that we would be happy to continue to follow her as needed, but she will also continue to follow up with Dr. Lindi Adie in medical oncology. She was counseled on skin care as well as measures to avoid sun exposure to this area.  2. Survivorship. We discussed the importance of survivorship evaluation and encouraged her to attend her upcoming visit with that clinic.    Loma Messing, LPN

## 2020-09-07 ENCOUNTER — Ambulatory Visit: Payer: BC Managed Care – PPO | Admitting: Physical Therapy

## 2020-09-12 ENCOUNTER — Ambulatory Visit: Payer: BC Managed Care – PPO | Admitting: Physical Therapy

## 2020-09-14 ENCOUNTER — Other Ambulatory Visit: Payer: Self-pay

## 2020-09-14 ENCOUNTER — Encounter: Payer: Self-pay | Admitting: Physical Therapy

## 2020-09-14 ENCOUNTER — Ambulatory Visit: Payer: BC Managed Care – PPO | Attending: Surgery | Admitting: Physical Therapy

## 2020-09-14 DIAGNOSIS — I890 Lymphedema, not elsewhere classified: Secondary | ICD-10-CM | POA: Diagnosis not present

## 2020-09-14 DIAGNOSIS — C50911 Malignant neoplasm of unspecified site of right female breast: Secondary | ICD-10-CM | POA: Diagnosis not present

## 2020-09-14 DIAGNOSIS — C50912 Malignant neoplasm of unspecified site of left female breast: Secondary | ICD-10-CM

## 2020-09-14 DIAGNOSIS — Z483 Aftercare following surgery for neoplasm: Secondary | ICD-10-CM | POA: Insufficient documentation

## 2020-09-14 DIAGNOSIS — R293 Abnormal posture: Secondary | ICD-10-CM | POA: Insufficient documentation

## 2020-09-14 DIAGNOSIS — M25612 Stiffness of left shoulder, not elsewhere classified: Secondary | ICD-10-CM | POA: Insufficient documentation

## 2020-09-14 DIAGNOSIS — L599 Disorder of the skin and subcutaneous tissue related to radiation, unspecified: Secondary | ICD-10-CM | POA: Diagnosis not present

## 2020-09-14 NOTE — Therapy (Signed)
[place], [place], [address] Phone: [phone]   Fax:  [phone]  Physical Therapy Treatment  Patient Details  Name: [name] [name] MRN: [id number] Date of Birth: 1955/12/19 Referring Provider (PT): [name]   Encounter Date: 09/14/2020   PT End of Session - 09/14/20 1456    Visit Number 11    Number of Visits 19    Date for PT Re-Evaluation 10/12/20    PT Start Time 4008    PT Stop Time 1400    PT Time Calculation (min) 57 min    Activity Tolerance Patient tolerated treatment well    Behavior During Therapy [facility] for tasks assessed/performed           Past Medical History:  Diagnosis Date  . Abnormal glucose   . Anxiety    05/05/20  . Arthritis   . Breast cancer ([place]) 03/2020  . Esophageal stricture    Dr.Dora Olevia Perches  . GERD (gastroesophageal reflux disease)   . History of colonic polyps   . Hyperlipidemia   . Hypertension   . MVP (mitral valve prolapse)    history of   . Osteoporosis   . Tobacco abuse    tried Chantix- caused agression    Past Surgical History:  Procedure Laterality Date  . APPENDECTOMY  2008  . AXILLARY SENTINEL NODE BIOPSY Right 05/09/2020   Procedure: RIGHT AXILLARY SENTINEL NODE BIOPSY;  Surgeon: Alphonsa Overall, MD;  Location: [place];  Service: General;  Laterality: Right;  . BREAST BIOPSY  2000  . BREAST BIOPSY Bilateral 2021  . BREAST LUMPECTOMY WITH RADIOACTIVE SEED LOCALIZATION Right 05/09/2020   Procedure: RIGHT BREAST LUMPECTOMY WITH RADIOACTIVE SEED;  Surgeon: Alphonsa Overall, MD;  Location: [place];  Service: General;  Laterality: Right;  . COLONOSCOPY    . ESOPHAGOGASTRODUODENOSCOPY    . MASTECTOMY, PARTIAL Left 05/09/2020   Procedure: LEFT MASECTOMY;  Surgeon: Alphonsa Overall, MD;  Location: [place];  Service: General;  Laterality: Left;  . SENTINEL NODE BIOPSY Left 05/09/2020   Procedure: LEFT AXILLARY SENTINEL NODE BIOPSY;  Surgeon: Alphonsa Overall, MD;   Location: [place];  Service: General;  Laterality: Left;  . 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 vocal cord polyps removed     benigh, Dr Erik Obey    There were no vitals filed for this visit.   Subjective Assessment - 09/14/20 1321    Subjective My chest is feeling good.    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 No/denies    Pain Score 0-No pain              [facility] PT Assessment - 09/14/20 0001      AROM   Right Shoulder Flexion 175 Degrees    Right Shoulder ABduction 180 Degrees    Right Shoulder Internal Rotation 72 Degrees    Right Shoulder External Rotation 90 Degrees    Left Shoulder Flexion 173 Degrees    Left Shoulder ABduction 180 Degrees    Left Shoulder Internal Rotation 63 Degrees    Left Shoulder External Rotation 90  Degrees             LYMPHEDEMA/ONCOLOGY QUESTIONNAIRE - 09/14/20 0001      Right Upper Extremity Lymphedema   15 cm Proximal to Olecranon Process 31.5 cm    Olecranon Process 26.5 cm    15 cm Proximal to Ulnar Styloid Process 25.6 cm    Just Proximal to Ulnar Styloid Process 16 cm    Across Hand at [employer] 18.5 cm    At [place] of 2nd Digit 6 cm      Left Upper Extremity Lymphedema   15 cm Proximal to Olecranon Process 35 cm    Olecranon Process 28 cm    15 cm Proximal to Ulnar Styloid Process 25.5 cm    Just Proximal to Ulnar Styloid Process 16.7 cm    Across Hand at [employer] 19.5 cm    At [place] of 2nd Digit 6.2 cm           L-DEX FLOWSHEETS - 09/14/20 1300      L-DEX LYMPHEDEMA SCREENING   BASELINE RIGHT 0.3    BASELINE LEFT  -0.6                                  PT Long Term Goals - 09/14/20 1503      PT LONG TERM GOAL #5   Title Pt will be independent in a home exercise program for long term stretching and strengthening    Time 4    Period Weeks    Status New    Target Date 10/12/20      Additional Long Term Goals   Additional Long Term Goals Yes      PT LONG TERM GOAL #6   Title Pt will report 75% improvement in muscle tightness across in left pec and left lateral trunk to allow improved comfort.    Time 4    Period Weeks    Status New    Target Date 10/12/20      PT LONG TERM GOAL #7   Title Pt will be independent in self MLD for long term management of swelling.    Time 4    Period Weeks    Status New    Target Date 10/12/20                 Plan - 09/14/20 1457    Clinical Impression Statement Pt returns to PT after completion of radiation. Pt reports she has some tightness in her left pec at end shoulder ROM. She bilateral shoulder ROM is [facility] and has returned to baseline. She does have a measureable difference in circumferences of bilateral UEs. Her L UE demonstrates a 3.5 cm difference from R at 15 cm proximal to olecranon. Her L UE is visibly more swollen than her R. Remeasured L-dex today and there was not a considerable increase from baseline despite the appearance of swelling. Pt would benefit from skilled PT services to decrease L pec tightness and instruct pt in self MLD for management of lymphedema/edema.    Stability/Clinical Decision Making Stable/Uncomplicated    PT Frequency 2x / week    PT Duration 4 weeks    PT Treatment/Interventions ADLs/Self Care Home Management;Therapeutic exercise;Patient/family education;Manual lymph drainage;Manual techniques;Taping;Passive range of motion;Scar mobilization    PT Next Visit Plan instruct in self MLD for LUE, stretch L pec    Consulted and Agree with Plan of Care  Patient           Patient will benefit  from skilled therapeutic intervention in order to improve the following deficits and impairments:  Postural dysfunction,Decreased knowledge of precautions,Increased fascial restricitons,Decreased scar mobility,Increased edema,Decreased strength  Visit Diagnosis: Disorder of the skin and subcutaneous tissue related to radiation, unspecified  Lymphedema, not elsewhere classified  Malignant neoplasm of right female breast, unspecified estrogen receptor status, unspecified site of breast ([place])  Malignant neoplasm of left female breast, unspecified estrogen receptor status, unspecified site of breast [facility])     Problem List Patient Active Problem List   Diagnosis Date Noted  . Bilateral breast cancer ([place]) 03/24/2020  . Hypertension 10/20/2017  . History of squamous cell carcinoma of skin 09/12/2016  . 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 OSA (obstructive sleep apnea) 01/18/2013  . Adjustment disorder with mixed anxiety and depressed mood 08/17/2012  . Hot flashes 03/23/2012  . GERD 08/28/2009  . Abnormal glucose 08/28/2009  . PARESTHESIA 11/11/2008  . History of colonic polyps 11/11/2008  . Benign paroxysmal positional vertigo 10/25/2008  . Hyperlipemia 09/30/2008  . TOBACCO ABUSE 09/30/2008  . Osteoporosis 09/30/2008    Allyson Sabal [facility] 09/14/2020, 4:01 PM  [place], [place], [address] Phone: [phone]   Fax:  [phone]  Name: MARSIA CINO MRN: [id number] Date of Birth: 07/18/1955  Manus Gunning, PT 09/14/20 4:01 PM

## 2020-09-18 ENCOUNTER — Ambulatory Visit
Admission: RE | Admit: 2020-09-18 | Discharge: 2020-09-18 | Disposition: A | Discharge status: Home / Self Care | Payer: BC Managed Care – PPO | Source: Ambulatory Visit | Attending: Radiation Oncology | Admitting: Radiation Oncology

## 2020-09-18 DIAGNOSIS — C50912 Malignant neoplasm of unspecified site of left female breast: Secondary | ICD-10-CM | POA: Insufficient documentation

## 2020-09-18 DIAGNOSIS — C50911 Malignant neoplasm of unspecified site of right female breast: Secondary | ICD-10-CM | POA: Insufficient documentation

## 2020-09-19 ENCOUNTER — Other Ambulatory Visit: Payer: Self-pay

## 2020-09-19 ENCOUNTER — Ambulatory Visit: Payer: BC Managed Care – PPO | Admitting: Physical Therapy

## 2020-09-19 ENCOUNTER — Encounter: Payer: Self-pay | Admitting: Physical Therapy

## 2020-09-19 DIAGNOSIS — I890 Lymphedema, not elsewhere classified: Secondary | ICD-10-CM

## 2020-09-19 DIAGNOSIS — C50911 Malignant neoplasm of unspecified site of right female breast: Secondary | ICD-10-CM | POA: Diagnosis not present

## 2020-09-19 DIAGNOSIS — M25612 Stiffness of left shoulder, not elsewhere classified: Secondary | ICD-10-CM | POA: Diagnosis not present

## 2020-09-19 DIAGNOSIS — C50912 Malignant neoplasm of unspecified site of left female breast: Secondary | ICD-10-CM | POA: Diagnosis not present

## 2020-09-19 DIAGNOSIS — R293 Abnormal posture: Secondary | ICD-10-CM | POA: Diagnosis not present

## 2020-09-19 DIAGNOSIS — L599 Disorder of the skin and subcutaneous tissue related to radiation, unspecified: Secondary | ICD-10-CM | POA: Diagnosis not present

## 2020-09-19 DIAGNOSIS — Z483 Aftercare following surgery for neoplasm: Secondary | ICD-10-CM | POA: Diagnosis not present

## 2020-09-19 NOTE — Therapy (Signed)
[place], [place], [address] Phone: [phone]   Fax:  [phone]  Physical Therapy Treatment  Patient Details  Name: [name] [name] MRN: [id number] Date of Birth: May 01, 1956 Referring Provider (PT): [name]   Encounter Date: 09/19/2020   PT End of Session - 09/19/20 1402    Visit Number 12    Number of Visits 19    Date for PT Re-Evaluation 10/12/20    PT Start Time 6967    PT Stop Time 1355    PT Time Calculation (min) 51 min    Activity Tolerance Patient tolerated treatment well    Behavior During Therapy [facility] for tasks assessed/performed           Past Medical History:  Diagnosis Date  . Abnormal glucose   . Anxiety    05/05/20  . Arthritis   . Breast cancer ([place]) 03/2020  . Esophageal stricture    Dr.Dora Olevia Perches  . GERD (gastroesophageal reflux disease)   . History of colonic polyps   . Hyperlipidemia   . Hypertension   . MVP (mitral valve prolapse)    history of   . Osteoporosis   . Tobacco abuse    tried Chantix- caused agression    Past Surgical History:  Procedure Laterality Date  . APPENDECTOMY  2008  . AXILLARY SENTINEL NODE BIOPSY Right 05/09/2020   Procedure: RIGHT AXILLARY SENTINEL NODE BIOPSY;  Surgeon: Alphonsa Overall, MD;  Location: [place];  Service: General;  Laterality: Right;  . BREAST BIOPSY  2000  . BREAST BIOPSY Bilateral 2021  . BREAST LUMPECTOMY WITH RADIOACTIVE SEED LOCALIZATION Right 05/09/2020   Procedure: RIGHT BREAST LUMPECTOMY WITH RADIOACTIVE SEED;  Surgeon: Alphonsa Overall, MD;  Location: [place];  Service: General;  Laterality: Right;  . COLONOSCOPY    . ESOPHAGOGASTRODUODENOSCOPY    . MASTECTOMY, PARTIAL Left 05/09/2020   Procedure: LEFT MASECTOMY;  Surgeon: Alphonsa Overall, MD;  Location: [place];  Service: General;  Laterality: Left;  . SENTINEL NODE BIOPSY Left 05/09/2020   Procedure: LEFT AXILLARY SENTINEL NODE BIOPSY;  Surgeon: Alphonsa Overall, MD;   Location: [place];  Service: General;  Laterality: Left;  . 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 vocal cord polyps removed     benigh, Dr Erik Obey    There were no vitals filed for this visit.   Subjective Assessment - 09/19/20 1304    Subjective I went to the beach on Friday. I did some carrying and lifting and I was a little more sore.    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 No/denies    Pain Score 0-No pain                 LYMPHEDEMA/ONCOLOGY QUESTIONNAIRE - 09/19/20 0001      Left Upper Extremity Lymphedema   15 cm Proximal to Olecranon Process 35 cm (P)                       [facility] Adult PT Treatment/Exercise - 09/19/20 0001      Manual  Therapy   Manual Therapy Manual Lymphatic Drainage (MLD);Soft tissue mobilization;Myofascial release    Soft tissue mobilization to left pec on tight band at lateral chest    Myofascial Release to cording extending from left mastectomy scar to left axilla - several cords palpable    Manual Lymphatic Drainage (MLD) short neck, 5 diphragmatic breaths, left inguinal nodes and establishment of axillo inguinal pathway, L UE moving fluid towards pathway then retracing all steps while educating pt in anatomy and physiology of lymphatic system                  PT Education - 09/19/20 1401    Education Details anatomy and physiology of lymphatic system and MLD techniques    Person(s) Educated Patient    Methods Explanation    Comprehension Verbalized understanding               PT Long Term Goals - 09/14/20 1503      PT  LONG TERM GOAL #5   Title Pt will be independent in a home exercise program for long term stretching and strengthening    Time 4    Period Weeks    Status New    Target Date 10/12/20      Additional Long Term Goals   Additional Long Term Goals Yes      PT LONG TERM GOAL #6   Title Pt will report 75% improvement in muscle tightness across in left pec and left lateral trunk to allow improved comfort.    Time 4    Period Weeks    Status New    Target Date 10/12/20      PT LONG TERM GOAL #7   Title Pt will be independent in self MLD for long term management of swelling.    Time 4    Period Weeks    Status New    Target Date 10/12/20                 Plan - 09/19/20 1402    Clinical Impression Statement Began MLD to LUE today to address increased swelling at upper arm. Began instructing pt in anatomy and physiology of the lymphatic system. Will issue handout at next session and instruct pt in correct technique. Also began manual therapy to left pec to decrease tightness and along cording where pt feels the most discomfort. Pt reports she felt relief by end of session.    PT Frequency 2x / week    PT Duration 4 weeks    PT Treatment/Interventions ADLs/Self Care Home Management;Therapeutic exercise;Patient/family education;Manual lymph drainage;Manual techniques;Taping;Passive range of motion;Scar mobilization    PT Next Visit Plan instruct in self MLD for LUE, stretch L pec    PT Home Exercise Plan post op breast exercises., supine dowel exercises; end ROM stretching in doorway, supine scap    Consulted and Agree with Plan of Care Patient           Patient will benefit from skilled therapeutic intervention in order to improve the following deficits and impairments:  Postural dysfunction,Decreased knowledge of precautions,Increased fascial restricitons,Decreased scar mobility,Increased edema,Decreased strength  Visit Diagnosis: Lymphedema, not elsewhere classified  Disorder  of the skin and subcutaneous tissue related to radiation, unspecified     Problem List Patient Active Problem List   Diagnosis Date Noted  . Bilateral breast cancer ([place]) 03/24/2020  . Hypertension 10/20/2017  . History of squamous cell carcinoma of skin 09/12/2016  . 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  OSA (obstructive sleep apnea) 01/18/2013  . Adjustment disorder with mixed anxiety and depressed mood 08/17/2012  . Hot flashes 03/23/2012  . GERD 08/28/2009  . Abnormal glucose 08/28/2009  . PARESTHESIA 11/11/2008  . History of colonic polyps 11/11/2008  . Benign paroxysmal positional vertigo 10/25/2008  . Hyperlipemia 09/30/2008  . TOBACCO ABUSE 09/30/2008  . Osteoporosis 09/30/2008    Allyson Sabal [facility] 09/19/2020, 2:05 PM  [place], [place], [address] Phone: [phone]   Fax:  [phone]  Name: [name] [name] MRN: [id number] Date of Birth: 21-Jun-1956  Manus Gunning, PT 09/19/20 2:06 PM

## 2020-09-21 ENCOUNTER — Encounter: Payer: Self-pay | Admitting: Physical Therapy

## 2020-09-21 ENCOUNTER — Ambulatory Visit: Payer: BC Managed Care – PPO | Admitting: Physical Therapy

## 2020-09-21 ENCOUNTER — Other Ambulatory Visit: Payer: Self-pay

## 2020-09-21 DIAGNOSIS — L599 Disorder of the skin and subcutaneous tissue related to radiation, unspecified: Secondary | ICD-10-CM

## 2020-09-21 DIAGNOSIS — R293 Abnormal posture: Secondary | ICD-10-CM | POA: Diagnosis not present

## 2020-09-21 DIAGNOSIS — Z483 Aftercare following surgery for neoplasm: Secondary | ICD-10-CM | POA: Diagnosis not present

## 2020-09-21 DIAGNOSIS — C50912 Malignant neoplasm of unspecified site of left female breast: Secondary | ICD-10-CM | POA: Diagnosis not present

## 2020-09-21 DIAGNOSIS — C50911 Malignant neoplasm of unspecified site of right female breast: Secondary | ICD-10-CM | POA: Diagnosis not present

## 2020-09-21 DIAGNOSIS — I890 Lymphedema, not elsewhere classified: Secondary | ICD-10-CM | POA: Diagnosis not present

## 2020-09-21 DIAGNOSIS — M25612 Stiffness of left shoulder, not elsewhere classified: Secondary | ICD-10-CM | POA: Diagnosis not present

## 2020-09-21 NOTE — Therapy (Signed)
[place], [place], [address] Phone: [phone]   Fax:  [phone]  Physical Therapy Treatment  Patient Details  Name: [name] [name] MRN: [id number] Date of Birth: [birth year] Referring Provider (PT): [name]   Encounter Date: 09/21/2020   PT End of Session - 09/21/20 1352    Visit Number 13    Number of Visits 19    Date for PT Re-Evaluation 10/12/20    PT Start Time 1303    PT Stop Time 1352    PT Time Calculation (min) 49 min    Activity Tolerance Patient tolerated treatment well    Behavior During Therapy [facility] for tasks assessed/performed           Past Medical History:  Diagnosis Date  . Abnormal glucose   . Anxiety    05/05/20  . Arthritis   . Breast cancer ([place]) 03/2020  . Esophageal stricture    Dr.Dora Olevia Perches  . GERD (gastroesophageal reflux disease)   . History of colonic polyps   . Hyperlipidemia   . Hypertension   . MVP (mitral valve prolapse)    history of   . Osteoporosis   . Tobacco abuse    tried Chantix- caused agression    Past Surgical History:  Procedure Laterality Date  . APPENDECTOMY  2008  . AXILLARY SENTINEL NODE BIOPSY Right 05/09/2020   Procedure: RIGHT AXILLARY SENTINEL NODE BIOPSY;  Surgeon: Alphonsa Overall, MD;  Location: [place];  Service: General;  Laterality: Right;  . BREAST BIOPSY  2000  . BREAST BIOPSY Bilateral 2021  . BREAST LUMPECTOMY WITH RADIOACTIVE SEED LOCALIZATION Right 05/09/2020   Procedure: RIGHT BREAST LUMPECTOMY WITH RADIOACTIVE SEED;  Surgeon: Alphonsa Overall, MD;  Location: [place];  Service: General;  Laterality: Right;  . COLONOSCOPY    . ESOPHAGOGASTRODUODENOSCOPY    . MASTECTOMY, PARTIAL Left 05/09/2020   Procedure: LEFT MASECTOMY;  Surgeon: Alphonsa Overall, MD;  Location: [place];  Service: General;  Laterality: Left;  . SENTINEL NODE BIOPSY Left 05/09/2020   Procedure: LEFT AXILLARY SENTINEL NODE BIOPSY;  Surgeon: Alphonsa Overall, MD;   Location: [place];  Service: General;  Laterality: Left;  . 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 vocal cord polyps removed     benigh, Dr Erik Obey    There were no vitals filed for this visit.   Subjective Assessment - 09/21/20 1306    Subjective It was a big relief after last session because my chest was a constant irritant.    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 No/denies    Pain Score 0-No pain                             [facility] Adult PT Treatment/Exercise - 09/21/20 0001      Manual Therapy   Soft tissue mobilization to left pec on tight band at lateral chest    Myofascial Release to cording extending from left mastectomy scar to left axilla - several cords palpable  PT Long Term Goals - 09/14/20 1503      PT LONG TERM GOAL #5   Title Pt will be independent in a home exercise program for long term stretching and strengthening    Time 4    Period Weeks    Status New    Target Date 10/12/20      Additional Long Term Goals   Additional Long Term Goals Yes      PT LONG TERM GOAL #6   Title Pt will report 75% improvement in muscle tightness across in left pec and left lateral trunk to allow improved comfort.    Time 4    Period Weeks    Status New    Target Date 10/12/20      PT LONG TERM GOAL #7   Title Pt will be independent in self MLD for long term management of swelling.    Time 4    Period Weeks    Status New    Target Date 10/12/20                 Plan - 09/21/20 1353    Clinical Impression Statement Pt  felt so much relief after last session. She is no longer having a constant irritation in her left chest from tightness of pec and cording. Continued to performed soft tissue mobilization and myofascial release to this area today to continue to reduce tightness. Will insruct pt in self MLD at next session and issue handout. Pt felt decreased tightness by end of session.    PT Frequency 2x / week    PT Duration 4 weeks    PT Treatment/Interventions ADLs/Self Care Home Management;Therapeutic exercise;Patient/family education;Manual lymph drainage;Manual techniques;Taping;Passive range of motion;Scar mobilization    PT Next Visit Plan instruct in self MLD for LUE, stretch L pec    PT Home Exercise Plan post op breast exercises., supine dowel exercises; end ROM stretching in doorway, supine scap    Consulted and Agree with Plan of Care Patient           Patient will benefit from skilled therapeutic intervention in order to improve the following deficits and impairments:  Postural dysfunction,Decreased knowledge of precautions,Increased fascial restricitons,Decreased scar mobility,Increased edema,Decreased strength  Visit Diagnosis: Disorder of the skin and subcutaneous tissue related to radiation, unspecified     Problem List Patient Active Problem List   Diagnosis Date Noted  . Bilateral breast cancer ([place]) 03/24/2020  . Hypertension 10/20/2017  . History of squamous cell carcinoma of skin 09/12/2016  . 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 OSA (obstructive sleep apnea) 01/18/2013  . Adjustment disorder with mixed anxiety and depressed mood 08/17/2012  . Hot flashes 03/23/2012  . GERD 08/28/2009  . Abnormal glucose 08/28/2009  . PARESTHESIA 11/11/2008  . History of colonic polyps 11/11/2008  . Benign paroxysmal positional vertigo 10/25/2008  . Hyperlipemia 09/30/2008  . TOBACCO ABUSE 09/30/2008  . Osteoporosis 09/30/2008    Allyson Sabal [facility] 09/21/2020, 1:55  PM  [place], [place], [address] Phone: [phone]   Fax:  [phone]  Name: [name] [name] MRN: [id number] Date of Birth: 01/25/1956  Manus Gunning, PT 09/21/20 1:55 PM

## 2020-09-22 ENCOUNTER — Telehealth: Payer: Self-pay | Admitting: Radiation Oncology

## 2020-09-22 NOTE — Telephone Encounter (Signed)
Called patient to inform her that [name] [name], [place] will try to give her a call 3/14. No answer, LVM.

## 2020-09-25 ENCOUNTER — Other Ambulatory Visit: Payer: Self-pay

## 2020-09-25 ENCOUNTER — Ambulatory Visit
Admission: RE | Admit: 2020-09-25 | Discharge: 2020-09-25 | Disposition: A | Discharge status: Home / Self Care | Payer: BC Managed Care – PPO | Source: Ambulatory Visit | Attending: Radiation Oncology | Admitting: Radiation Oncology

## 2020-09-25 DIAGNOSIS — C50912 Malignant neoplasm of unspecified site of left female breast: Secondary | ICD-10-CM

## 2020-09-25 DIAGNOSIS — C50911 Malignant neoplasm of unspecified site of right female breast: Secondary | ICD-10-CM

## 2020-09-25 NOTE — Progress Notes (Signed)
  Radiation Oncology         [phone] ________________________________  Name: [name] [name] MRN: [id number]  Date of Service: 09/25/2020  DOB: 10-Mar-1956  Post Treatment Telephone Note  Diagnosis:   Malignant neoplasm of unspecified site of right female breast  Interval Since Last Radiation:  5 weeks   07/03/2020 through 08/21/2020 Site Technique Total Dose (Gy) Dose per Fx (Gy) Completed Fx Beam Energies  Chest Wall, Left: CW_Lt 3D 50.4/50.4 1.8 28/28 10X  Chest Wall, Left: CW_Lt_SCLV 3D 50.4/50.4 1.8 28/28 6X, 10X  Chest Wall, Left: CW_Lt_Bst Electron 10/10 2 5/5 9E  Breast, Right: Breast_Rt 3D 50.4/50.4 1.8 28/28 6X  Breast, Right: Breast_Rt_Bst 3D 10/10 2 5/5 6X      Narrative:  The patient was contacted today for routine follow-up. During treatment she did very well with radiotherapy and did not have significant desquamation. She reports she is having some swelling of her skin on the left that PT has noticed. She is working with PT to try to manage this.  Impression/Plan: 1. Malignant neoplasm of unspecified site of right female breast. The patient has been doing well since completion of radiotherapy. We discussed that we would be happy to continue to follow her as needed, but she will also continue to follow up with Dr. Lindi Adie in medical oncology. She was counseled on skin care as well as measures to avoid sun exposure to this area and to continue with PT for management of lymphedema. 2. Survivorship. We discussed the importance of survivorship evaluation and encouraged her to attend her upcoming visit with that clinic.     Carola Rhine, PAC

## 2020-09-26 ENCOUNTER — Ambulatory Visit: Payer: BC Managed Care – PPO | Admitting: Physical Therapy

## 2020-09-26 ENCOUNTER — Encounter: Payer: Self-pay | Admitting: Physical Therapy

## 2020-09-26 ENCOUNTER — Other Ambulatory Visit: Payer: Self-pay

## 2020-09-26 DIAGNOSIS — R293 Abnormal posture: Secondary | ICD-10-CM | POA: Diagnosis not present

## 2020-09-26 DIAGNOSIS — Z483 Aftercare following surgery for neoplasm: Secondary | ICD-10-CM | POA: Diagnosis not present

## 2020-09-26 DIAGNOSIS — C50911 Malignant neoplasm of unspecified site of right female breast: Secondary | ICD-10-CM | POA: Diagnosis not present

## 2020-09-26 DIAGNOSIS — C50912 Malignant neoplasm of unspecified site of left female breast: Secondary | ICD-10-CM | POA: Diagnosis not present

## 2020-09-26 DIAGNOSIS — L599 Disorder of the skin and subcutaneous tissue related to radiation, unspecified: Secondary | ICD-10-CM

## 2020-09-26 DIAGNOSIS — M25612 Stiffness of left shoulder, not elsewhere classified: Secondary | ICD-10-CM | POA: Diagnosis not present

## 2020-09-26 DIAGNOSIS — I890 Lymphedema, not elsewhere classified: Secondary | ICD-10-CM | POA: Diagnosis not present

## 2020-09-26 NOTE — Therapy (Signed)
[place], [place], [address] Phone: [phone]   Fax:  [phone]  Physical Therapy Treatment  Patient Details  Name: [name] [name] MRN: [id number] Date of Birth: [birth year] Referring Provider (PT): [name]   Encounter Date: 09/26/2020   PT End of Session - 09/26/20 1401    Visit Number 14    Number of Visits 19    Date for PT Re-Evaluation 10/12/20    PT Start Time 8127    PT Stop Time 1351    PT Time Calculation (min) 46 min    Activity Tolerance Patient tolerated treatment well    Behavior During Therapy [facility] for tasks assessed/performed           Past Medical History:  Diagnosis Date  . Abnormal glucose   . Anxiety    05/05/20  . Arthritis   . Breast cancer ([place]) 03/2020  . Esophageal stricture    Dr.Dora Olevia Perches  . GERD (gastroesophageal reflux disease)   . History of colonic polyps   . Hyperlipidemia   . Hypertension   . MVP (mitral valve prolapse)    history of   . Osteoporosis   . Tobacco abuse    tried Chantix- caused agression    Past Surgical History:  Procedure Laterality Date  . APPENDECTOMY  2008  . AXILLARY SENTINEL NODE BIOPSY Right 05/09/2020   Procedure: RIGHT AXILLARY SENTINEL NODE BIOPSY;  Surgeon: Alphonsa Overall, MD;  Location: [place];  Service: General;  Laterality: Right;  . BREAST BIOPSY  2000  . BREAST BIOPSY Bilateral 2021  . BREAST LUMPECTOMY WITH RADIOACTIVE SEED LOCALIZATION Right 05/09/2020   Procedure: RIGHT BREAST LUMPECTOMY WITH RADIOACTIVE SEED;  Surgeon: Alphonsa Overall, MD;  Location: [place];  Service: General;  Laterality: Right;  . COLONOSCOPY    . ESOPHAGOGASTRODUODENOSCOPY    . MASTECTOMY, PARTIAL Left 05/09/2020   Procedure: LEFT MASECTOMY;  Surgeon: Alphonsa Overall, MD;  Location: [place];  Service: General;  Laterality: Left;  . SENTINEL NODE BIOPSY Left 05/09/2020   Procedure: LEFT AXILLARY SENTINEL NODE BIOPSY;  Surgeon: Alphonsa Overall, MD;   Location: [place];  Service: General;  Laterality: Left;  . 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 vocal cord polyps removed     benigh, Dr Erik Obey    There were no vitals filed for this visit.   Subjective Assessment - 09/26/20 1307    Subjective The front of my chest is so much better. It is a huge relief. I have a little tightness up under the arm but it is not in the front of the chest anymore.    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 No/denies    Pain Score 0-No pain                             [facility] Adult PT Treatment/Exercise - 09/26/20 0001      Manual Therapy   Soft tissue mobilization using cocoa butter to lateral part of L pec and along tight band extending from  mastectomy scar to axilla and along serratus anterior - numerous areas with "knotty" texture noted that improved during session today                       PT Long Term Goals - 09/14/20 1503      PT LONG TERM GOAL #5   Title Pt will be independent in a home exercise program for long term stretching and strengthening    Time 4    Period Weeks    Status New    Target Date 10/12/20      Additional Long Term Goals   Additional Long Term Goals Yes      PT LONG TERM GOAL #6   Title Pt will report 75% improvement in muscle tightness across in left pec and left lateral trunk to allow improved comfort.    Time 4    Period Weeks    Status New    Target Date 10/12/20      PT LONG TERM GOAL #7   Title Pt will be independent in self MLD for long term management of swelling.    Time 4    Period  Weeks    Status New    Target Date 10/12/20                 Plan - 09/26/20 1402    Clinical Impression Statement Pt felt much improvement after last session and is no longer having pain and tightness across her L pec. She was still having discomfort in area of serratus and lateral edge of L pec so focused on manual therapy to these areas today. This area was very tight at beginning of session but improved greatly by end of session with no more tightness palpable. Pt reports she could tell a big difference.    PT Frequency 2x / week    PT Duration 4 weeks    PT Treatment/Interventions ADLs/Self Care Home Management;Therapeutic exercise;Patient/family education;Manual lymph drainage;Manual techniques;Taping;Passive range of motion;Scar mobilization    PT Next Visit Plan instruct in self MLD for LUE, stretch L pec    PT Home Exercise Plan post op breast exercises., supine dowel exercises; end ROM stretching in doorway, supine scap    Consulted and Agree with Plan of Care Patient           Patient will benefit from skilled therapeutic intervention in order to improve the following deficits and impairments:  Postural dysfunction,Decreased knowledge of precautions,Increased fascial restricitons,Decreased scar mobility,Increased edema,Decreased strength  Visit Diagnosis: Disorder of the skin and subcutaneous tissue related to radiation, unspecified     Problem List Patient Active Problem List   Diagnosis Date Noted  . Bilateral breast cancer ([place]) 03/24/2020  . Hypertension 10/20/2017  . History of squamous cell carcinoma of skin 09/12/2016  . 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 OSA (obstructive sleep apnea) 01/18/2013  . Adjustment disorder with mixed anxiety and depressed mood 08/17/2012  . Hot flashes 03/23/2012  . GERD 08/28/2009  . Abnormal glucose 08/28/2009  . PARESTHESIA 11/11/2008  . History of colonic polyps 11/11/2008  . Benign paroxysmal positional  vertigo 10/25/2008  . Hyperlipemia 09/30/2008  . TOBACCO ABUSE 09/30/2008  . Osteoporosis 09/30/2008    Allyson Sabal [facility] 09/26/2020, 2:05 PM  [place], [place], [address] Phone: [phone]   Fax:  [phone]  Name: TOMIKA ECKLES MRN: [id number] Date of Birth: 04/10/1956  Manus Gunning,  PT 09/26/20 2:05 PM

## 2020-09-28 ENCOUNTER — Encounter: Payer: Self-pay | Admitting: Physical Therapy

## 2020-09-28 ENCOUNTER — Other Ambulatory Visit: Payer: Self-pay

## 2020-09-28 ENCOUNTER — Ambulatory Visit: Payer: BC Managed Care – PPO | Admitting: Physical Therapy

## 2020-09-28 DIAGNOSIS — C50912 Malignant neoplasm of unspecified site of left female breast: Secondary | ICD-10-CM | POA: Diagnosis not present

## 2020-09-28 DIAGNOSIS — C50911 Malignant neoplasm of unspecified site of right female breast: Secondary | ICD-10-CM | POA: Diagnosis not present

## 2020-09-28 DIAGNOSIS — I890 Lymphedema, not elsewhere classified: Secondary | ICD-10-CM | POA: Diagnosis not present

## 2020-09-28 DIAGNOSIS — M25612 Stiffness of left shoulder, not elsewhere classified: Secondary | ICD-10-CM | POA: Diagnosis not present

## 2020-09-28 DIAGNOSIS — R293 Abnormal posture: Secondary | ICD-10-CM | POA: Diagnosis not present

## 2020-09-28 DIAGNOSIS — L599 Disorder of the skin and subcutaneous tissue related to radiation, unspecified: Secondary | ICD-10-CM

## 2020-09-28 DIAGNOSIS — Z483 Aftercare following surgery for neoplasm: Secondary | ICD-10-CM

## 2020-09-28 NOTE — Therapy (Signed)
[place], [place], [address] Phone: [phone]   Fax:  [phone]  Physical Therapy Treatment  Patient Details  Name: [name] [name] MRN: [id number] Date of Birth: 02-23-1956 Referring Provider (PT): [name]   Encounter Date: 09/28/2020   PT End of Session - 09/28/20 1358    Visit Number 15    Number of Visits 19    Date for PT Re-Evaluation 10/12/20    PT Start Time 0355    PT Stop Time 1355    PT Time Calculation (min) 52 min    Activity Tolerance Patient tolerated treatment well    Behavior During Therapy [facility] for tasks assessed/performed           Past Medical History:  Diagnosis Date  . Abnormal glucose   . Anxiety    05/05/20  . Arthritis   . Breast cancer ([place]) 03/2020  . Esophageal stricture    Dr.Dora Olevia Perches  . GERD (gastroesophageal reflux disease)   . History of colonic polyps   . Hyperlipidemia   . Hypertension   . MVP (mitral valve prolapse)    history of   . Osteoporosis   . Tobacco abuse    tried Chantix- caused agression    Past Surgical History:  Procedure Laterality Date  . APPENDECTOMY  2008  . AXILLARY SENTINEL NODE BIOPSY Right 05/09/2020   Procedure: RIGHT AXILLARY SENTINEL NODE BIOPSY;  Surgeon: Alphonsa Overall, MD;  Location: [place];  Service: General;  Laterality: Right;  . BREAST BIOPSY  2000  . BREAST BIOPSY Bilateral 2021  . BREAST LUMPECTOMY WITH RADIOACTIVE SEED LOCALIZATION Right 05/09/2020   Procedure: RIGHT BREAST LUMPECTOMY WITH RADIOACTIVE SEED;  Surgeon: Alphonsa Overall, MD;  Location: [place];  Service: General;  Laterality: Right;  . COLONOSCOPY    . ESOPHAGOGASTRODUODENOSCOPY    . MASTECTOMY, PARTIAL Left 05/09/2020   Procedure: LEFT MASECTOMY;  Surgeon: Alphonsa Overall, MD;  Location: [place];  Service: General;  Laterality: Left;  . SENTINEL NODE BIOPSY Left 05/09/2020   Procedure: LEFT AXILLARY SENTINEL NODE BIOPSY;  Surgeon: Alphonsa Overall, MD;   Location: [place];  Service: General;  Laterality: Left;  . 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 vocal cord polyps removed     benigh, Dr Erik Obey    There were no vitals filed for this visit.   Subjective Assessment - 09/28/20 1306    Subjective The places that you worked last time felt much better.    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 No/denies    Pain Score 0-No pain              [facility] PT Assessment - 09/28/20 0001      Observation/Other Assessments   Observations left shoulder more depressed and scapula is more retracted - pt getting impingement like pain with L shoulder flexion and abduction                         [facility] Adult PT Treatment/Exercise - 09/28/20 0001  Manual Therapy   Soft tissue mobilization using cocoa butter to lateral part of L pec and along tight band extending from mastectomy scar to axilla and along serratus anterior while moving arm through different ranges of flexion,abduction and ER - numerous areas with "knotty" texture noted that improved during session today                       PT Long Term Goals - 09/14/20 1503      PT LONG TERM GOAL #5   Title Pt will be independent in a home exercise program for long term stretching and strengthening    Time 4    Period Weeks    Status New    Target Date 10/12/20      Additional Long Term Goals   Additional Long Term Goals Yes      PT LONG TERM GOAL #6   Title Pt will report 75% improvement in muscle tightness across in left pec and left lateral trunk to allow improved  comfort.    Time 4    Period Weeks    Status New    Target Date 10/12/20      PT LONG TERM GOAL #7   Title Pt will be independent in self MLD for long term management of swelling.    Time 4    Period Weeks    Status New    Target Date 10/12/20                 Plan - 09/28/20 1359    Clinical Impression Statement Attempted to instruct pt in doorway and corner stretch but pt had pain in upper arm and did not feel any stretch in pec muscle. Assessed scapular movement and positioning. Pt's left scapula is depressed and retracted when compared to the R side. Pt would benefit from exercises to help correct this to decrease impingement like pain. Continued with manual therapy to decrease L pec tightness and discomfort with improvement noted by end of session.    PT Frequency 2x / week    PT Duration 4 weeks    PT Treatment/Interventions ADLs/Self Care Home Management;Therapeutic exercise;Patient/family education;Manual lymph drainage;Manual techniques;Taping;Passive range of motion;Scar mobilization    PT Next Visit Plan do exercises to improve scapular elevation and abduction, instruct in self MLD for LUE, stretch L pec    PT Home Exercise Plan post op breast exercises., supine dowel exercises; end ROM stretching in doorway, supine scap    Consulted and Agree with Plan of Care Patient           Patient will benefit from skilled therapeutic intervention in order to improve the following deficits and impairments:  Postural dysfunction,Decreased knowledge of precautions,Increased fascial restricitons,Decreased scar mobility,Increased edema,Decreased strength  Visit Diagnosis: Disorder of the skin and subcutaneous tissue related to radiation, unspecified  Aftercare following surgery for neoplasm     Problem List Patient Active Problem List   Diagnosis Date Noted  . Bilateral breast cancer ([place]) 03/24/2020  . Hypertension 10/20/2017  . History of squamous cell carcinoma of skin  09/12/2016  . 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 OSA (obstructive sleep apnea) 01/18/2013  . Adjustment disorder with mixed anxiety and depressed mood 08/17/2012  . Hot flashes 03/23/2012  . GERD 08/28/2009  . Abnormal glucose 08/28/2009  . PARESTHESIA 11/11/2008  . History of colonic polyps 11/11/2008  . Benign paroxysmal positional vertigo 10/25/2008  . Hyperlipemia 09/30/2008  . TOBACCO ABUSE 09/30/2008  .  Osteoporosis 09/30/2008    Allyson Sabal [facility] 09/28/2020, 2:01 PM  [place], [place], [address] Phone: [phone]   Fax:  [phone]  Name: [name] [name] MRN: [id number] Date of Birth: 1956/03/31  Manus Gunning, PT 09/28/20 2:01 PM

## 2020-10-03 ENCOUNTER — Encounter: Payer: Self-pay | Admitting: Physical Therapy

## 2020-10-03 ENCOUNTER — Other Ambulatory Visit: Payer: Self-pay

## 2020-10-03 ENCOUNTER — Ambulatory Visit: Payer: BC Managed Care – PPO | Admitting: Physical Therapy

## 2020-10-03 DIAGNOSIS — Z483 Aftercare following surgery for neoplasm: Secondary | ICD-10-CM | POA: Diagnosis not present

## 2020-10-03 DIAGNOSIS — C50912 Malignant neoplasm of unspecified site of left female breast: Secondary | ICD-10-CM | POA: Diagnosis not present

## 2020-10-03 DIAGNOSIS — I890 Lymphedema, not elsewhere classified: Secondary | ICD-10-CM

## 2020-10-03 DIAGNOSIS — C50911 Malignant neoplasm of unspecified site of right female breast: Secondary | ICD-10-CM | POA: Diagnosis not present

## 2020-10-03 DIAGNOSIS — M25612 Stiffness of left shoulder, not elsewhere classified: Secondary | ICD-10-CM | POA: Diagnosis not present

## 2020-10-03 DIAGNOSIS — L599 Disorder of the skin and subcutaneous tissue related to radiation, unspecified: Secondary | ICD-10-CM | POA: Diagnosis not present

## 2020-10-03 DIAGNOSIS — R293 Abnormal posture: Secondary | ICD-10-CM | POA: Diagnosis not present

## 2020-10-03 NOTE — Patient Instructions (Addendum)
Deep Effective Breath   Standing, sitting, or laying down, place both hands on the belly. Take a deep breath IN, expanding the belly; then breath OUT, contracting the belly. Repeat __5__ times. Do __2-3__ sessions per day and before your self massage.  http://gt2.exer.us/866    Copyright  VHI. All rights reserved.  Axilla to Inguinal Nodes - Sweep   On involved side, make 5 circles at groin at panty line, then pump _5__ times from armpit along side of trunk to outer hip, making your other pathway. Do __1_ time per day.  Copyright  VHI. All rights reserved.  Arm Posterior: Elbow to Shoulder - Sweep   Pump _5__ times from back of elbow to top of shoulder. Then inner to outer upper arm _5_ times, then outer arm again _5_ times. Then back to the pathway _2-3_ times. Do _1__ time per day.  Copyright  VHI. All rights reserved.  ARM: [place] Wrist to Elbow - Sweep   Pump or stationary circles _5__ times from wrist to elbow making sure to do both sides of the forearm. Then retrace your steps to the outer arm, and the pathway _2-3_ times each. Do _1__ time per day.  Copyright  VHI. All rights reserved.  ARM: Dorsum of Hand to Shoulder - Sweep   Pump or stationary circles _5__ times on back of hand including knuckle spaces and individual fingers if needed working up towards the wrist, then retrace all your steps working back up the forearm, doing both sides; upper outer arm and back to your pathways _2-3_ times each. Then do 5 circles again at involved groin where you started! Good job!! Do __1_ time per day.  Copyright  VHI. All rights reserved.

## 2020-10-03 NOTE — Therapy (Signed)
[place], [place], [address] Phone: [phone]   Fax:  [phone]  Physical Therapy Treatment  Patient Details  Name: [name] [name] MRN: [id number] Date of Birth: 1955/11/18 Referring Provider (PT): [name]   Encounter Date: 10/03/2020   PT End of Session - 10/03/20 1100    Visit Number 16    Number of Visits 19    Date for PT Re-Evaluation 10/12/20    PT Start Time 1001    PT Stop Time 1045   pt requested to end session early due to needing to be somewhere at 11   PT Time Calculation (min) 44 min    Activity Tolerance Patient tolerated treatment well    Behavior During Therapy [facility] for tasks assessed/performed           Past Medical History:  Diagnosis Date  . Abnormal glucose   . Anxiety    05/05/20  . Arthritis   . Breast cancer ([place]) 03/2020  . Esophageal stricture    Dr.Dora Olevia Perches  . GERD (gastroesophageal reflux disease)   . History of colonic polyps   . Hyperlipidemia   . Hypertension   . MVP (mitral valve prolapse)    history of   . Osteoporosis   . Tobacco abuse    tried Chantix- caused agression    Past Surgical History:  Procedure Laterality Date  . APPENDECTOMY  2008  . AXILLARY SENTINEL NODE BIOPSY Right 05/09/2020   Procedure: RIGHT AXILLARY SENTINEL NODE BIOPSY;  Surgeon: Alphonsa Overall, MD;  Location: [place];  Service: General;  Laterality: Right;  . BREAST BIOPSY  2000  . BREAST BIOPSY Bilateral 2021  . BREAST LUMPECTOMY WITH RADIOACTIVE SEED LOCALIZATION Right 05/09/2020   Procedure: RIGHT BREAST LUMPECTOMY WITH RADIOACTIVE SEED;  Surgeon: Alphonsa Overall, MD;  Location: [place];  Service: General;  Laterality: Right;  . COLONOSCOPY    . ESOPHAGOGASTRODUODENOSCOPY    . MASTECTOMY, PARTIAL Left 05/09/2020   Procedure: LEFT MASECTOMY;  Surgeon: Alphonsa Overall, MD;  Location: [place];  Service: General;  Laterality: Left;  . SENTINEL NODE BIOPSY Left 05/09/2020    Procedure: LEFT AXILLARY SENTINEL NODE BIOPSY;  Surgeon: Alphonsa Overall, MD;  Location: [place];  Service: General;  Laterality: Left;  . 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 vocal cord polyps removed     benigh, Dr Erik Obey    There were no vitals filed for this visit.   Subjective Assessment - 10/03/20 1002    Subjective I am not having any of the sensations now. THe only place is under my armpit. I did notice some weakness in my shoulder. I am sleeping better.    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 No/denies    Pain Score 0-No pain                             [facility] Adult PT Treatment/Exercise - 10/03/20 0001      Manual Therapy   Manual Lymphatic Drainage (MLD) short neck, 5 diphragmatic breaths,  left inguinal nodes and establishment of axillo inguinal pathway, L UE moving fluid towards pathway then retracing all steps while educating pt in anatomy and physiology of lymphatic system - instructed pt in entire technique and issued handout, had pt return demonstrate technique and provided verbal and tactile cues for correct skin stretch and sequence. Pt had some difficulty due to pain in R shoulder when reaching across to do self MLD to LUE                       PT Long Term Goals - 09/14/20 1503      PT LONG TERM GOAL #5   Title Pt will be independent in a home exercise program for long term stretching and strengthening    Time 4    Period Weeks    Status New    Target Date 10/12/20      Additional Long Term Goals   Additional Long Term Goals Yes      PT  LONG TERM GOAL #6   Title Pt will report 75% improvement in muscle tightness across in left pec and left lateral trunk to allow improved comfort.    Time 4    Period Weeks    Status New    Target Date 10/12/20      PT LONG TERM GOAL #7   Title Pt will be independent in self MLD for long term management of swelling.    Time 4    Period Weeks    Status New    Target Date 10/12/20                 Plan - 10/03/20 1101    Clinical Impression Statement Began instructing pt in self MLD today and issued handout. Pt had to leave session early today. She reports the sensations across her chest have resolved and she is feeling more normal. She does present with some fullness at left lateral trunk and upper arm. Will remeasure at next session and decide if pt needs a compression bra and sleeve. WIll also begin posture exercises and exercises to reduce left scapular depression and retraction. Educated pt to practice self MLD at home and bring questions at next session.    PT Frequency 2x / week    PT Duration 4 weeks    PT Treatment/Interventions ADLs/Self Care Home Management;Therapeutic exercise;Patient/family education;Manual lymph drainage;Manual techniques;Taping;Passive range of motion;Scar mobilization    PT Next Visit Plan remeasure- does she need bra or sleeve, scap mobs, do exercises to improve scapular elevation and abduction, instruct in self MLD for LUE, stretch L pec    PT Home Exercise Plan post op breast exercises., supine dowel exercises; end ROM stretching in doorway, supine scap    Consulted and Agree with Plan of Care Patient           Patient will benefit from skilled therapeutic intervention in order to improve the following deficits and impairments:  Postural dysfunction,Decreased knowledge of precautions,Increased fascial restricitons,Decreased scar mobility,Increased edema,Decreased strength  Visit Diagnosis: Lymphedema, not elsewhere classified     Problem  List Patient Active Problem List   Diagnosis Date Noted  . Bilateral breast cancer ([place]) 03/24/2020  . Hypertension 10/20/2017  . History of squamous cell carcinoma of skin 09/12/2016  . 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 OSA (obstructive sleep apnea) 01/18/2013  . Adjustment disorder with mixed anxiety and depressed mood 08/17/2012  . Hot flashes 03/23/2012  . GERD 08/28/2009  .  Abnormal glucose 08/28/2009  . PARESTHESIA 11/11/2008  . History of colonic polyps 11/11/2008  . Benign paroxysmal positional vertigo 10/25/2008  . Hyperlipemia 09/30/2008  . TOBACCO ABUSE 09/30/2008  . Osteoporosis 09/30/2008    Allyson Sabal [facility] 10/03/2020, 11:04 AM  [place], [place], [address] Phone: [phone]   Fax:  [phone]  Name: [name] [name] MRN: [id number] Date of Birth: 1955-11-29  Manus Gunning, PT 10/03/20 11:05 AM

## 2020-10-04 ENCOUNTER — Ambulatory Visit: Payer: BC Managed Care – PPO | Admitting: Physical Therapy

## 2020-10-09 ENCOUNTER — Ambulatory Visit: Payer: BC Managed Care – PPO | Admitting: Physical Therapy

## 2020-10-09 ENCOUNTER — Other Ambulatory Visit: Payer: Self-pay

## 2020-10-09 ENCOUNTER — Encounter: Payer: Self-pay | Admitting: Physical Therapy

## 2020-10-09 DIAGNOSIS — M25612 Stiffness of left shoulder, not elsewhere classified: Secondary | ICD-10-CM

## 2020-10-09 DIAGNOSIS — R293 Abnormal posture: Secondary | ICD-10-CM

## 2020-10-09 DIAGNOSIS — L599 Disorder of the skin and subcutaneous tissue related to radiation, unspecified: Secondary | ICD-10-CM

## 2020-10-09 DIAGNOSIS — C50911 Malignant neoplasm of unspecified site of right female breast: Secondary | ICD-10-CM | POA: Diagnosis not present

## 2020-10-09 DIAGNOSIS — Z483 Aftercare following surgery for neoplasm: Secondary | ICD-10-CM

## 2020-10-09 DIAGNOSIS — C50912 Malignant neoplasm of unspecified site of left female breast: Secondary | ICD-10-CM | POA: Diagnosis not present

## 2020-10-09 DIAGNOSIS — I890 Lymphedema, not elsewhere classified: Secondary | ICD-10-CM | POA: Diagnosis not present

## 2020-10-09 NOTE — Patient Instructions (Signed)
Access Code: [email or web address] URL: [email or web address] Date: 10/09/2020 Prepared by: Manus Gunning  Exercises Seated Single Arm Shoulder Shrug - 1 x daily - 7 x weekly - 1 sets - 10 reps Supine Scapular Protraction in Flexion with Dumbbells - 1 x daily - 7 x weekly - 1 sets - 10 reps

## 2020-10-09 NOTE — Therapy (Signed)
[place], [place], [address] Phone: [phone]   Fax:  [phone]  Physical Therapy Treatment  Patient Details  Name: SKI POLICH MRN: [id number] Date of Birth: 01/09/1956 Referring Provider (PT): [name]   Encounter Date: 10/09/2020   PT End of Session - 10/09/20 1057    Visit Number 17    Number of Visits 19    Date for PT Re-Evaluation 10/12/20    PT Start Time 1008   pt arrived late   PT Stop Time 1055    PT Time Calculation (min) 47 min    Activity Tolerance Patient tolerated treatment well    Behavior During Therapy [facility] for tasks assessed/performed           Past Medical History:  Diagnosis Date  . Abnormal glucose   . Anxiety    05/05/20  . Arthritis   . Breast cancer ([place]) 03/2020  . Esophageal stricture    Dr.Dora Olevia Perches  . GERD (gastroesophageal reflux disease)   . History of colonic polyps   . Hyperlipidemia   . Hypertension   . MVP (mitral valve prolapse)    history of   . Osteoporosis   . Tobacco abuse    tried Chantix- caused agression    Past Surgical History:  Procedure Laterality Date  . APPENDECTOMY  2008  . AXILLARY SENTINEL NODE BIOPSY Right 05/09/2020   Procedure: RIGHT AXILLARY SENTINEL NODE BIOPSY;  Surgeon: Alphonsa Overall, MD;  Location: [place];  Service: General;  Laterality: Right;  . BREAST BIOPSY  2000  . BREAST BIOPSY Bilateral 2021  . BREAST LUMPECTOMY WITH RADIOACTIVE SEED LOCALIZATION Right 05/09/2020   Procedure: RIGHT BREAST LUMPECTOMY WITH RADIOACTIVE SEED;  Surgeon: Alphonsa Overall, MD;  Location: [place];  Service: General;  Laterality: Right;  . COLONOSCOPY    . ESOPHAGOGASTRODUODENOSCOPY    . MASTECTOMY, PARTIAL Left 05/09/2020   Procedure: LEFT MASECTOMY;  Surgeon: Alphonsa Overall, MD;  Location: [place];  Service: General;  Laterality: Left;  . SENTINEL NODE BIOPSY Left 05/09/2020   Procedure: LEFT AXILLARY SENTINEL NODE BIOPSY;  Surgeon:  Alphonsa Overall, MD;  Location: [place];  Service: General;  Laterality: Left;  . 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 vocal cord polyps removed     benigh, Dr Erik Obey    There were no vitals filed for this visit.   Subjective Assessment - 10/09/20 1009    Subjective I am doing fine. The pains have been gone.    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 No/denies    Pain Score 0-No pain                 LYMPHEDEMA/ONCOLOGY QUESTIONNAIRE - 10/09/20 0001      Right Upper Extremity Lymphedema   15 cm Proximal to Olecranon Process 32.5 cm    10 cm Proximal to Olecranon Process 31 cm    Olecranon Process 26 cm    15 cm Proximal to Ulnar Styloid Process 25.5 cm    10 cm Proximal to Ulnar Styloid Process 21.5 cm  Just Proximal to Ulnar Styloid Process 16 cm    Across Hand at [employer] 18.8 cm    At [place] of 2nd Digit 6.4 cm      Left Upper Extremity Lymphedema   15 cm Proximal to Olecranon Process 34.5 cm    10 cm Proximal to Olecranon Process 32.5 cm    Olecranon Process 27 cm    15 cm Proximal to Ulnar Styloid Process 25.4 cm    10 cm Proximal to Ulnar Styloid Process 21.1 cm    Just Proximal to Ulnar Styloid Process 16.5 cm    Across Hand at [employer] 18 cm    At [place] of 2nd Digit 6 cm                      [facility] Adult PT Treatment/Exercise - 10/09/20 0001      Exercises   Exercises Other Exercises      Shoulder Exercises: Supine   Protraction Strengthening;Both;10 reps    Protraction Weight (lbs) 2    Other Supine Exercises seated shoulder shrugs on L side  x 10 reps      Manual Therapy   Manual Therapy Edema management;Soft tissue mobilization;Other (comment)    Edema Management remeasured UE circumferences    Soft tissue mobilization using cocoa butter to entire serratus anterior with numerous trigger points noted that released some today    Other Manual Therapy assessed scapular mobility and measured distance from medial border to spine bilaterally and they were even compared to last session                       PT Long Term Goals - 09/14/20 1503      PT LONG TERM GOAL #5   Title Pt will be independent in a home exercise program for long term stretching and strengthening    Time 4    Period Weeks    Status New    Target Date 10/12/20      Additional Long Term Goals   Additional Long Term Goals Yes      PT LONG TERM GOAL #6   Title Pt will report 75% improvement in muscle tightness across in left pec and left lateral trunk to allow improved comfort.    Time 4    Period Weeks    Status New    Target Date 10/12/20      PT LONG TERM GOAL #7   Title Pt will be independent in self MLD for long term management of swelling.    Time 4    Period Weeks    Status New    Target Date 10/12/20                 Plan - 10/09/20 1058    Clinical Impression Statement Remeasured bilateral UE circumferences. Pt's demonstrates exactly a 2 cm difference 15 cm proximal to olecranon with the L side being larger. Educated pt that she has reduced since the last time she was measured. Educated pt that 2 cm is the cutoff point for lymphedema and she may not have lymphedema. Will continue to montitor this to determine if pt will need a compression sleeve. Educated pt not to overdo it with her arm doing activites around her home Reassessed bilateral scapular motion. L scapula has been more adducted and depressed but this has improved. The L scapula is no longer more adducted but is still slightly more depressed.  Educated pt in upper trap  strengthening exercise as well as serratus strengthening exercise and gave these as part of an HEP. Continued with manual therapy to serratus in area of trigger points with release noted today. Pt reports she has not been having any pain recently.    PT Frequency 2x / week    PT Duration 4 weeks    PT Treatment/Interventions ADLs/Self Care Home Management;Therapeutic exercise;Patient/family education;Manual lymph drainage;Manual techniques;Taping;Passive range of motion;Scar mobilization    PT Next Visit Plan remeasure- does she need bra or sleeve, scap mobs, do exercises to improve scapular elevation, instruct in lat strech and pec stretch stretch L pec    PT Home Exercise Plan post op breast exercises., supine dowel exercises; end ROM stretching in doorway, supine scap,Access Code: [email or web address]    Consulted and Agree with Plan of Care Patient           Patient will benefit from skilled therapeutic intervention in order to improve the following deficits and impairments:  Postural dysfunction,Decreased knowledge of precautions,Increased fascial restricitons,Decreased scar mobility,Increased edema,Decreased strength  Visit Diagnosis: Stiffness of left shoulder, not elsewhere classified  Abnormal posture  Aftercare following surgery for neoplasm  Disorder of the skin and subcutaneous tissue related to radiation, unspecified     Problem List Patient Active Problem List   Diagnosis Date Noted  . Bilateral breast cancer ([place]) 03/24/2020  . Hypertension 10/20/2017  . History of squamous cell carcinoma of skin 09/12/2016  . 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 OSA (obstructive sleep apnea) 01/18/2013  . Adjustment disorder with mixed anxiety and depressed mood 08/17/2012  . Hot flashes 03/23/2012  . GERD 08/28/2009  . Abnormal glucose 08/28/2009  . PARESTHESIA 11/11/2008  . History of colonic polyps 11/11/2008  . Benign paroxysmal positional vertigo 10/25/2008  .  Hyperlipemia 09/30/2008  . TOBACCO ABUSE 09/30/2008  . Osteoporosis 09/30/2008    Allyson Sabal [facility] 10/09/2020, 12:17 PM  [place], [place], [address] Phone: [phone]   Fax:  [phone]  Name: GABRIELL DAIGNEAULT MRN: [id number] Date of Birth: March 17, 1956  Manus Gunning, PT 10/09/20 12:17 PM

## 2020-10-10 NOTE — Patient Instructions (Incomplete)
Health Maintenance Due  Topic Date Due  . COVID-19 Vaccine (3 - Pfizer risk 4-dose series) 01/29/2020   Depression screen [facility] 2/9 03/24/2020 10/13/2018 12/18/2017  Decreased Interest 2 0 0  Down, Depressed, Hopeless 2 0 0  PHQ - 2 Score 4 0 0  Altered sleeping 0 0 0  Tired, decreased energy 0 0 0  Change in appetite 0 0 3  Feeling bad or failure about yourself  0 0 0  Trouble concentrating 0 0 0  Moving slowly or fidgety/restless 0 0 0  Suicidal thoughts 0 0 0  PHQ-9 Score 4 0 3  Difficult doing work/chores - Not difficult at all Not difficult at all  Some recent data might be hidden

## 2020-10-10 NOTE — Progress Notes (Deleted)
Phone [phone] In person visit   Subjective:   [name] [name] is a 64 y.o. year old very pleasant female patient who presents for/with See problem oriented charting No chief complaint on file.   This visit occurred during the SARS-CoV-2 public health emergency.  Safety protocols were in place, including screening questions prior to the visit, additional usage of staff PPE, and extensive cleaning of exam room while observing appropriate contact time as indicated for disinfecting solutions.   Past Medical History-  Patient Active Problem List   Diagnosis Date Noted  . Bilateral breast cancer ([place]) 03/24/2020  . Hypertension 10/20/2017  . History of squamous cell carcinoma of skin 09/12/2016  . 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 OSA (obstructive sleep apnea) 01/18/2013  . Adjustment disorder with mixed anxiety and depressed mood 08/17/2012  . Hot flashes 03/23/2012  . GERD 08/28/2009  . Abnormal glucose 08/28/2009  . PARESTHESIA 11/11/2008  . History of colonic polyps 11/11/2008  . Benign paroxysmal positional vertigo 10/25/2008  . Hyperlipemia 09/30/2008  . TOBACCO ABUSE 09/30/2008  . Osteoporosis 09/30/2008    Medications- reviewed and updated Current Outpatient Medications  Medication Sig Dispense Refill  . acetaminophen (TYLENOL) 500 MG tablet Take 500 mg by mouth every 6 (six) hours as needed.    Marland Kitchen albuterol (VENTOLIN HFA) 108 (90 Base) MCG/ACT inhaler INHALE 2 PUFFS BY MOUTH EVERY 6 HOURS AS NEEDED (Patient taking differently: Inhale 2 puffs into the lungs every 6 (six) hours as needed for wheezing or shortness of breath. ) 8.5 g 2  . amLODipine (NORVASC) 2.5 MG tablet Take 1 tablet (2.5 mg total) by mouth daily. (Patient taking differently: Take 2.5 mg by mouth every morning. ) 90 tablet 3  . anastrozole (ARIMIDEX) 1 MG tablet Take 1 tablet (1 mg total) by mouth daily. 90 tablet 3  . atorvastatin (LIPITOR) 40 MG tablet Take 1 tablet (40 mg total) by mouth  daily. (Patient taking differently: Take 40 mg by mouth every morning. ) 90 tablet 3  . azelastine (ASTELIN) 0.1 % nasal spray Place 2 sprays into both nostrils 2 (two) times daily. (Patient taking differently: Place 2 sprays into both nostrils 2 (two) times daily as needed (congestion/allergies.). ) 30 mL 12  . citalopram (CELEXA) 20 MG tablet Take 1 tablet (20 mg total) by mouth daily. (Patient taking differently: Take 20 mg by mouth every morning. ) 90 tablet 3  . esomeprazole (NEXIUM) 40 MG capsule TAKE 1 CAPSULE BY MOUTH TWICE A DAY BEFORE A MEAL (Patient taking differently: Take 40 mg by mouth in the morning and at bedtime. TAKE 1 CAPSULE BY MOUTH TWICE A DAY BEFORE A MEAL  >>>>>>CAN ONLY TAKE NEXIUM - BRAND NAME - NO GENERIC<<<<<) 180 capsule 3   No current facility-administered medications for this visit.     Objective:  There were no vitals taken for this visit. Gen: NAD, resting comfortably CV: RRR no murmurs rubs or gallops Lungs: CTAB no crackles, wheeze, rhonchi Abdomen: soft/nontender/nondistended/normal bowel sounds. No rebound or guarding.  Ext: no edema Skin: warm, dry Neuro: grossly normal, moves all extremities  ***    Assessment and Plan  Spots on nose/arm S:***  A/P: ***     breast cancer 03/15/2020 ***  pap 02/17/20 to be abstracted by brittany ***  No problem-specific Assessment & Plan notes found for this encounter.   Recommended follow up: ***No follow-ups on file. Future Appointments  Date Time Provider [place]  10/11/2020 11:00 AM [place],  Blaire L, PT [facility] None  10/12/2020  2:20 PM [name], MD [facility]  10/17/2020 11:00 AM [place] Beckmann, Blaire L, PT [facility] None  10/18/2020  3:00 PM Breedlove S[place], Reynolds L, PT [facility] None  10/24/2020 10:00 AM Breedlove [place], Blaire L, PT [facility] None  10/26/2020 10:00 AM Breedlove Blue, Blaire L, PT [facility] None  11/06/2020 11:00 AM Causey, Charlestine Massed, NP [facility] None     Lab/Order associations: No diagnosis found.  No orders of the defined types were placed in this encounter.   Time Spent: *** minutes of total time (4:21 PM***- 4:21 PM***) was spent on the date of the encounter performing the following actions: chart review prior to seeing the patient, obtaining history, performing a medically necessary exam, counseling on the treatment plan, placing orders, and documenting in our EHR.   Return precautions advised.  Clyde Lundborg, CMA

## 2020-10-11 ENCOUNTER — Encounter: Payer: Self-pay | Admitting: Physical Therapy

## 2020-10-11 ENCOUNTER — Other Ambulatory Visit: Payer: Self-pay

## 2020-10-11 ENCOUNTER — Ambulatory Visit: Payer: BC Managed Care – PPO | Admitting: Physical Therapy

## 2020-10-11 DIAGNOSIS — Z483 Aftercare following surgery for neoplasm: Secondary | ICD-10-CM | POA: Diagnosis not present

## 2020-10-11 DIAGNOSIS — C50912 Malignant neoplasm of unspecified site of left female breast: Secondary | ICD-10-CM | POA: Diagnosis not present

## 2020-10-11 DIAGNOSIS — I890 Lymphedema, not elsewhere classified: Secondary | ICD-10-CM | POA: Diagnosis not present

## 2020-10-11 DIAGNOSIS — M25612 Stiffness of left shoulder, not elsewhere classified: Secondary | ICD-10-CM

## 2020-10-11 DIAGNOSIS — C50911 Malignant neoplasm of unspecified site of right female breast: Secondary | ICD-10-CM | POA: Diagnosis not present

## 2020-10-11 DIAGNOSIS — R293 Abnormal posture: Secondary | ICD-10-CM | POA: Diagnosis not present

## 2020-10-11 DIAGNOSIS — L599 Disorder of the skin and subcutaneous tissue related to radiation, unspecified: Secondary | ICD-10-CM | POA: Diagnosis not present

## 2020-10-11 NOTE — Therapy (Signed)
[place], [place], [address] Phone: [phone]   Fax:  [phone]  Physical Therapy Treatment  Patient Details  Name: [name] [name] MRN: [id number] Date of Birth: November 08, 1955 Referring Provider (PT): Lavone Neri Date: 10/11/2020   PT End of Session - 10/11/20 1203    Visit Number 18    Number of Visits 19    Date for PT Re-Evaluation 10/12/20    PT Start Time 1103    PT Stop Time 1156    PT Time Calculation (min) 53 min    Activity Tolerance Patient tolerated treatment well    Behavior During Therapy [facility] for tasks assessed/performed           Past Medical History:  Diagnosis Date  . Abnormal glucose   . Anxiety    05/05/20  . Arthritis   . Breast cancer ([place]) 03/2020  . Esophageal stricture    Dr.Dora Olevia Perches  . GERD (gastroesophageal reflux disease)   . History of colonic polyps   . Hyperlipidemia   . Hypertension   . MVP (mitral valve prolapse)    history of   . Osteoporosis   . Tobacco abuse    tried Chantix- caused agression    Past Surgical History:  Procedure Laterality Date  . APPENDECTOMY  2008  . AXILLARY SENTINEL NODE BIOPSY Right 05/09/2020   Procedure: RIGHT AXILLARY SENTINEL NODE BIOPSY;  Surgeon: Alphonsa Overall, MD;  Location: [place];  Service: General;  Laterality: Right;  . BREAST BIOPSY  2000  . BREAST BIOPSY Bilateral 2021  . BREAST LUMPECTOMY WITH RADIOACTIVE SEED LOCALIZATION Right 05/09/2020   Procedure: RIGHT BREAST LUMPECTOMY WITH RADIOACTIVE SEED;  Surgeon: Alphonsa Overall, MD;  Location: [place];  Service: General;  Laterality: Right;  . COLONOSCOPY    . ESOPHAGOGASTRODUODENOSCOPY    . MASTECTOMY, PARTIAL Left 05/09/2020   Procedure: LEFT MASECTOMY;  Surgeon: Alphonsa Overall, MD;  Location: [place];  Service: General;  Laterality: Left;  . SENTINEL NODE BIOPSY Left 05/09/2020   Procedure: LEFT AXILLARY SENTINEL NODE BIOPSY;  Surgeon: Alphonsa Overall, MD;   Location: [place];  Service: General;  Laterality: Left;  . 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 vocal cord polyps removed     benigh, Dr Erik Obey    There were no vitals filed for this visit.   Subjective Assessment - 10/11/20 1107    Subjective I know that spot is still there because when I touch it its tender.    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 No/denies    Pain Score 0-No pain                             [facility] Adult PT Treatment/Exercise - 10/11/20 0001      Manual Therapy   Soft tissue mobilization to entire serratus anterior and lateral edge of latissiumus with numerous trigger points noted that released today but pt still with tenderness to the touch  PT Long Term Goals - 09/14/20 1503      PT LONG TERM GOAL #5   Title Pt will be independent in a home exercise program for long term stretching and strengthening    Time 4    Period Weeks    Status New    Target Date 10/12/20      Additional Long Term Goals   Additional Long Term Goals Yes      PT LONG TERM GOAL #6   Title Pt will report 75% improvement in muscle tightness across in left pec and left lateral trunk to allow improved comfort.    Time 4    Period Weeks    Status New    Target Date 10/12/20      PT LONG TERM GOAL #7   Title Pt will be independent in self MLD for long term management of swelling.    Time 4    Period Weeks    Status New    Target Date 10/12/20                 Plan - 10/11/20 1204    Clinical Impression Statement Pt has been  doing exercises issued at last session and has not been having any difficulty with them. Focused today on manual therapy to serratus and lateral wing of lats to decrease trigger points and relieve discomfort. Trigger points eased with treatment today but pt was still having tenderness in that area today.    PT Frequency 2x / week    PT Duration 4 weeks    PT Treatment/Interventions ADLs/Self Care Home Management;Therapeutic exercise;Patient/family education;Manual lymph drainage;Manual techniques;Taping;Passive range of motion;Scar mobilization    PT Next Visit Plan remeasure- does she need bra or sleeve, scap mobs, do exercises to improve scapular elevation, instruct in lat strech and pec stretch stretch L pec    PT Home Exercise Plan post op breast exercises., supine dowel exercises; end ROM stretching in doorway, supine scap,Access Code: [email or web address]    Consulted and Agree with Plan of Care Patient           Patient will benefit from skilled therapeutic intervention in order to improve the following deficits and impairments:  Postural dysfunction,Decreased knowledge of precautions,Increased fascial restricitons,Decreased scar mobility,Increased edema,Decreased strength  Visit Diagnosis: Stiffness of left shoulder, not elsewhere classified  Aftercare following surgery for neoplasm     Problem List Patient Active Problem List   Diagnosis Date Noted  . Bilateral breast cancer ([place]) 03/24/2020  . Hypertension 10/20/2017  . History of squamous cell carcinoma of skin 09/12/2016  . 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 OSA (obstructive sleep apnea) 01/18/2013  . Adjustment disorder with mixed anxiety and depressed mood 08/17/2012  . Hot flashes 03/23/2012  . GERD 08/28/2009  . Abnormal glucose 08/28/2009  . PARESTHESIA 11/11/2008  . History of colonic polyps 11/11/2008  . Benign paroxysmal positional vertigo 10/25/2008  . Hyperlipemia 09/30/2008  . TOBACCO ABUSE 09/30/2008   . Osteoporosis 09/30/2008    Allyson Sabal [facility] 10/11/2020, 12:05 PM  [place], [place], [address] Phone: [phone]   Fax:  [phone]  Name: [name] [name] MRN: [id number] Date of Birth: Feb 19, 1956  Manus Gunning, PT 10/11/20 12:05 PM

## 2020-10-12 ENCOUNTER — Ambulatory Visit: Payer: BC Managed Care – PPO | Admitting: Family Medicine

## 2020-10-12 NOTE — Progress Notes (Signed)
Phone [phone] In person visit   Subjective:   [name] [name] is a 65 y.o. year old very pleasant female patient who presents for/with See problem oriented charting Chief Complaint  Patient presents with  . Rash (Bump)    Has a bump on nose, and left arm. Spot on nose is tender to the touch. Both areas are red, peeling and have been present for awhile.    This visit occurred during the SARS-CoV-2 public health emergency.  Safety protocols were in place, including screening questions prior to the visit, additional usage of staff PPE, and extensive cleaning of exam room while observing appropriate contact time as indicated for disinfecting solutions.   Past Medical History-  Patient Active Problem List   Diagnosis Date Noted  . Bilateral breast cancer ([place]) 03/24/2020    Priority: High  . TOBACCO ABUSE 09/30/2008    Priority: High  . Hypertension 10/20/2017    Priority: Medium  . History of squamous cell carcinoma of skin 09/12/2016    Priority: Medium  . 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 Adjustment disorder with mixed anxiety and depressed mood 08/17/2012    Priority: Medium  . Hot flashes 03/23/2012    Priority: Medium  . GERD 08/28/2009    Priority: Medium  . Abnormal glucose 08/28/2009    Priority: Medium  . PARESTHESIA 11/11/2008    Priority: Medium  . Hyperlipemia 09/30/2008    Priority: Medium  . Osteoporosis 09/30/2008    Priority: Medium  . OSA (obstructive sleep apnea) 01/18/2013    Priority: Low  . History of colonic polyps 11/11/2008    Priority: Low  . Benign paroxysmal positional vertigo 10/25/2008    Priority: Low    Medications- reviewed and updated Current Outpatient Medications  Medication Sig Dispense Refill  . albuterol (VENTOLIN HFA) 108 (90 Base) MCG/ACT inhaler INHALE 2 PUFFS BY MOUTH EVERY 6 HOURS AS NEEDED (Patient taking differently: Inhale 2 puffs into the lungs every 6 (six) hours as needed for  wheezing or shortness of breath.) 8.5 g 2  . amLODipine (NORVASC) 2.5 MG tablet Take 1 tablet (2.5 mg total) by mouth daily. (Patient taking differently: Take 2.5 mg by mouth every morning.) 90 tablet 3  . anastrozole (ARIMIDEX) 1 MG tablet Take 1 tablet (1 mg total) by mouth daily. 90 tablet 3  . atorvastatin (LIPITOR) 40 MG tablet Take 1 tablet (40 mg total) by mouth daily. (Patient taking differently: Take 40 mg by mouth every morning.) 90 tablet 3  . azelastine (ASTELIN) 0.1 % nasal spray Place 2 sprays into both nostrils 2 (two) times daily. (Patient taking differently: Place 2 sprays into both nostrils 2 (two) times daily as needed (congestion/allergies.).) 30 mL 12  . citalopram (CELEXA) 20 MG tablet Take 1 tablet (20 mg total) by mouth daily. (Patient taking differently: Take 20 mg by mouth every morning.) 90 tablet 3  . esomeprazole (NEXIUM) 40 MG capsule TAKE 1 CAPSULE BY MOUTH TWICE A DAY BEFORE A MEAL (Patient taking differently: Take 40 mg by mouth in the morning and at bedtime. TAKE 1 CAPSULE BY MOUTH TWICE A DAY BEFORE A MEAL  >>>>>>CAN ONLY TAKE NEXIUM - BRAND NAME - NO GENERIC<<<<<) 180 capsule 3   No current facility-administered medications for this visit.     Objective:  BP 132/80   Pulse 75   Temp (!) 97.2 F (36.2 C) (Temporal)   Ht 5\' 5"  (1.651 m)   Wt 201 lb  6.4 oz (91.4 kg)   SpO2 94%   BMI 33.51 kg/m  Gen: NAD, resting comfortably CV: RRR no murmurs rubs or gallops Lungs: CTAB no crackles, wheeze, rhonchi Abdomen: soft/nontender/nondistended/normal bowel sounds. No rebound or guarding.  Ext: no edema Skin: warm, dry, raised lesion at least 0.5 cm across on left nose, on left arm slightly raised 1 x 1 cm lesion on left arm- patient reported pealed an outer layer off    Assessment and Plan  Spot on Nose and Arm S: left arm lesion there for months if not years- shows me a SK on right leg which she states was the same but she peeled off top layer.   States had  AK frozen on nose years ago- area became red again and a few months ago started to get larger.  A/P:  spot on left arm appears to be a seborrheic keratosis - still can have dermatology look at this.   Appears to have squamous cell skin cancer on her nose- urgent referral to dermatology placed- think she will need MOHs surgery most likely- obviously defer to derm expertise  #hypertension S: medication: Amlodipine 2.5 mg BP Readings from Last 3 Encounters:  10/16/20 132/80  08/07/20 (!) 159/69  06/21/20 136/75  A/P: Stable. Continue current medications.    #hyperlipidemia S: Medication:Atorvastatin 40 mg Lab Results  Component Value Date   CHOL 256 (H) 12/18/2017   HDL 38.70 (L) 12/18/2017   LDLCALC 139 (H) 01/13/2015   LDLDIRECT 190.0 12/18/2017   TRIG 238.0 (H) 12/18/2017   CHOLHDL 7 12/18/2017   A/P: hopefully stable- update lipid panel  # Tobacco use-patient smoking 1.5 packs/day last visit. Down to pack a day most of the time. Strongly encouraged  cesstaion  # Breast cancer- has done well with surgery and radiation- looking at starting antiestrogen after gets dental work done  #vaginal odor intermittently- diagnosed with BV in past and would like to self swab today. Only active with husband and not worried about infidelity. No discharge.   Recommended follow up: Return in about 4 months (around 02/15/2021) for physical or sooner if needed. Future Appointments  Date Time Provider [place]  10/17/2020 11:00 AM Cadott, Hollandale L, PT [facility] None  10/18/2020  3:00 PM [place] Eneas, Sterling L, PT [facility] None  10/24/2020 10:00 AM Wynelle Beckmann, Blaire L, PT [facility] None  10/26/2020 10:00 AM Wynelle Beckmann, Blaire L, PT [facility] None  11/09/2020  8:30 AM Causey, Charlestine Massed, NP [facility] None   Lab/Order associations: NOT FASTING   ICD-10-CM   1. Primary hypertension  I10   2. Hyperlipidemia, unspecified hyperlipidemia type  E78.5   3. TOBACCO ABUSE  F17.200    4. Squamous cell skin cancer  C44.92 Ambulatory referral to Dermatology  5. Vaginal odor  N89.8 Cervicovaginal ancillary only   Return precautions advised.  [name], MD

## 2020-10-12 NOTE — Patient Instructions (Addendum)
Health Maintenance Due  Topic Date Due  . COVID-19 Vaccine (3 - Pfizer risk 4-dose series)- reasonable to do this at your pharmacy 01/29/2020   Team please release labs from 9/20201- cbc, cmp, lipid Please stop by lab before you go If you have mychart- we will send your results within 3 business days of [place] receiving them.  If you do not have mychart- we will call you about results within 5 business days of [place] receiving them.  *please also note that you will see labs on mychart as soon as they post. I will later go in and write notes on them- will say "notes from Dr. Yong Channel"  Urgent dermatology referral- team please give her an update  Team chat with lab and help set patient up with self swab for bacterial vaginosis  4-6 months physical

## 2020-10-16 ENCOUNTER — Ambulatory Visit (INDEPENDENT_AMBULATORY_CARE_PROVIDER_SITE_OTHER): Payer: BC Managed Care – PPO | Admitting: Family Medicine

## 2020-10-16 ENCOUNTER — Other Ambulatory Visit (HOSPITAL_COMMUNITY)
Admission: RE | Admit: 2020-10-16 | Discharge: 2020-10-16 | Disposition: A | Discharge status: Home / Self Care | Payer: BC Managed Care – PPO | Source: Ambulatory Visit | Attending: Family Medicine | Admitting: Family Medicine

## 2020-10-16 ENCOUNTER — Other Ambulatory Visit: Payer: Self-pay

## 2020-10-16 ENCOUNTER — Encounter: Payer: Self-pay | Admitting: Family Medicine

## 2020-10-16 ENCOUNTER — Telehealth: Payer: Self-pay | Admitting: Adult Health

## 2020-10-16 VITALS — BP 132/80 | HR 75 | Temp 97.2°F | Ht 65.0 in | Wt 201.4 lb

## 2020-10-16 DIAGNOSIS — C4492 Squamous cell carcinoma of skin, unspecified: Secondary | ICD-10-CM

## 2020-10-16 DIAGNOSIS — E785 Hyperlipidemia, unspecified: Secondary | ICD-10-CM

## 2020-10-16 DIAGNOSIS — N898 Other specified noninflammatory disorders of vagina: Secondary | ICD-10-CM

## 2020-10-16 DIAGNOSIS — I10 Essential (primary) hypertension: Secondary | ICD-10-CM | POA: Diagnosis not present

## 2020-10-16 DIAGNOSIS — F17200 Nicotine dependence, unspecified, uncomplicated: Secondary | ICD-10-CM

## 2020-10-16 NOTE — Addendum Note (Signed)
Addended by: Jacob Moores on: 10/16/2020 04:27 PM   Modules accepted: Orders

## 2020-10-16 NOTE — Telephone Encounter (Signed)
R/s appts per provider being gone. Called pt, no answer. Left msg with updated appt date and time.

## 2020-10-17 ENCOUNTER — Encounter: Payer: Self-pay | Admitting: Physical Therapy

## 2020-10-17 ENCOUNTER — Ambulatory Visit: Payer: BC Managed Care – PPO | Attending: Surgery | Admitting: Physical Therapy

## 2020-10-17 DIAGNOSIS — L599 Disorder of the skin and subcutaneous tissue related to radiation, unspecified: Secondary | ICD-10-CM | POA: Diagnosis not present

## 2020-10-17 DIAGNOSIS — R293 Abnormal posture: Secondary | ICD-10-CM | POA: Diagnosis not present

## 2020-10-17 DIAGNOSIS — M25612 Stiffness of left shoulder, not elsewhere classified: Secondary | ICD-10-CM | POA: Diagnosis not present

## 2020-10-17 DIAGNOSIS — C50912 Malignant neoplasm of unspecified site of left female breast: Secondary | ICD-10-CM

## 2020-10-17 DIAGNOSIS — Z483 Aftercare following surgery for neoplasm: Secondary | ICD-10-CM

## 2020-10-17 DIAGNOSIS — C50911 Malignant neoplasm of unspecified site of right female breast: Secondary | ICD-10-CM | POA: Diagnosis not present

## 2020-10-17 DIAGNOSIS — I890 Lymphedema, not elsewhere classified: Secondary | ICD-10-CM

## 2020-10-17 NOTE — Therapy (Signed)
[place], [place], [address] Phone: [phone]   Fax:  [phone]  Physical Therapy Treatment  Patient Details  Name: [name] [name] MRN: [id number] Date of Birth: 11-26-1955 Referring Provider (PT): [name]   Encounter Date: 10/17/2020   PT End of Session - 10/17/20 1213    Visit Number 19    Number of Visits 23    Date for PT Re-Evaluation 11/14/20    PT Start Time 1103    PT Stop Time 1157    PT Time Calculation (min) 54 min    Activity Tolerance Patient tolerated treatment well    Behavior During Therapy [facility] for tasks assessed/performed           Past Medical History:  Diagnosis Date  . Abnormal glucose   . Anxiety    05/05/20  . Arthritis   . Breast cancer ([place]) 03/2020  . Esophageal stricture    Dr.Dora Olevia Perches  . GERD (gastroesophageal reflux disease)   . History of colonic polyps   . Hyperlipidemia   . Hypertension   . MVP (mitral valve prolapse)    history of   . Osteoporosis   . Tobacco abuse    tried Chantix- caused agression    Past Surgical History:  Procedure Laterality Date  . APPENDECTOMY  2008  . AXILLARY SENTINEL NODE BIOPSY Right 05/09/2020   Procedure: RIGHT AXILLARY SENTINEL NODE BIOPSY;  Surgeon: Alphonsa Overall, MD;  Location: [place];  Service: General;  Laterality: Right;  . BREAST BIOPSY  2000  . BREAST BIOPSY Bilateral 2021  . BREAST LUMPECTOMY WITH RADIOACTIVE SEED LOCALIZATION Right 05/09/2020   Procedure: RIGHT BREAST LUMPECTOMY WITH RADIOACTIVE SEED;  Surgeon: Alphonsa Overall, MD;  Location: [place];  Service: General;  Laterality: Right;  . COLONOSCOPY    . ESOPHAGOGASTRODUODENOSCOPY    . MASTECTOMY, PARTIAL Left 05/09/2020   Procedure: LEFT MASECTOMY;  Surgeon: Alphonsa Overall, MD;  Location: [place];  Service: General;  Laterality: Left;  . SENTINEL NODE BIOPSY Left 05/09/2020   Procedure: LEFT AXILLARY SENTINEL NODE BIOPSY;  Surgeon: Alphonsa Overall, MD;   Location: [place];  Service: General;  Laterality: Left;  . 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 vocal cord polyps removed     benigh, Dr Erik Obey    There were no vitals filed for this visit.   Subjective Assessment - 10/17/20 1105    Subjective I am feeling pretty good. I did not sleep last night.    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 No/denies    Pain Score 0-No pain                 LYMPHEDEMA/ONCOLOGY QUESTIONNAIRE - 10/17/20 0001      Right Upper Extremity Lymphedema   15 cm Proximal to Olecranon Process 33 cm    10 cm Proximal to Olecranon Process 31 cm    Olecranon Process 27 cm    15 cm Proximal to Ulnar Styloid Process 25.7 cm    10 cm Proximal to Ulnar Styloid Process 22 cm  Just Proximal to Ulnar Styloid Process 15.8 cm    Across Hand at [employer] 18.2 cm    At [place] of 2nd Digit 6.3 cm      Left Upper Extremity Lymphedema   15 cm Proximal to Olecranon Process 35.3 cm    10 cm Proximal to Olecranon Process 32.8 cm    Olecranon Process 27.5 cm    15 cm Proximal to Ulnar Styloid Process 25.5 cm    10 cm Proximal to Ulnar Styloid Process 21.5 cm    Just Proximal to Ulnar Styloid Process 16.5 cm    Across Hand at [employer] 19.2 cm    At [place] of 2nd Digit 6.1 cm           L-DEX FLOWSHEETS - 10/17/20 1100      L-DEX LYMPHEDEMA SCREENING   BASELINE RIGHT 1.1    BASELINE LEFT 1.2    RIGHT SIDE SCORE 1.5    LEFT SIDE SCORE 1.5    VALUE CHANGE RIGHT 0.4    VALUE CHANGE LEFT 0.3                     [facility] Adult PT Treatment/Exercise -  10/17/20 0001      Manual Therapy   Soft tissue mobilization to serratus and pec muscles to help decrease tightness and improve comfort with several trigger points noted, at end of session briefly to L upper traps to help decrease discomfort from her UE being overhead                       PT Long Term Goals - 10/17/20 1107      PT LONG TERM GOAL #1   Title Pt will return to baseline ROM measurements to allow pt to return to PLOF.    Time 8    Period Weeks    Status Achieved      PT LONG TERM GOAL #2   Title Pt will demonstate 165 degrees of bilateral shoulder flexion to allow her to reach overhead    Baseline R 160, L 144; 07/03/20- R 165 L 168    Time 5    Period Weeks    Status Achieved      PT LONG TERM GOAL #3   Title Pt will demonstrate 165 degrees of bilateral shoulder abduction to allow pt to reach out to the side    Baseline R 140 L 111; 07/03/20- R 178 L 179    Time 5    Period Weeks    Status Achieved      PT LONG TERM GOAL #4   Title Pt will report no increased skin sensitivty at anterior chest to allow improved comfort    Time 5    Period Weeks    Status Achieved      PT LONG TERM GOAL #5   Title Pt will be independent in a home exercise program for long term stretching and strengthening    Time 4    Period Weeks    Status On-going      PT LONG TERM GOAL #6   Title Pt will report 75% improvement in muscle tightness across in left pec and left lateral trunk to allow improved comfort.    Baseline 10/17/20- 90% improvement    Time 4    Period Weeks    Status Achieved      PT LONG TERM GOAL #7   Title Pt will  be independent in self MLD for long term management of swelling.    Time 4    Period Weeks    Status Achieved                 Plan - 10/17/20 1213    Clinical Impression Statement Assessed pt's progress towards goals in therapy. She has met all goals and is progressing towards discharge but would benefit from instruction in  After Breast Cancer Strengthening and Stretching program because she feels she has lost strength recently and would like a way to build up her strength. Pt woud benefit from a few additional PT visits to address this. Remeasured pt on SOZO today and there has been no significant change from baseline - will resume SOZO every 3 months. She still does have a 2.5 cm difference in L upper arm compared with right but this seems to not be worsening so will monitor it and if pt feels it is worsening she will come back in.    PT Frequency 1x / week    PT Duration 4 weeks    PT Treatment/Interventions ADLs/Self Care Home Management;Therapeutic exercise;Patient/family education;Manual lymph drainage;Manual techniques;Taping;Passive range of motion;Scar mobilization    PT Next Visit Plan instruct in ABC strengthening program    PT Home Exercise Plan post op breast exercises., supine dowel exercises; end ROM stretching in doorway, supine scap,Access Code: [email or web address]    Consulted and Agree with Plan of Care Patient           Patient will benefit from skilled therapeutic intervention in order to improve the following deficits and impairments:  Postural dysfunction,Decreased knowledge of precautions,Increased fascial restricitons,Decreased scar mobility,Increased edema,Decreased strength  Visit Diagnosis: Stiffness of left shoulder, not elsewhere classified  Aftercare following surgery for neoplasm  Abnormal posture  Disorder of the skin and subcutaneous tissue related to radiation, unspecified  Lymphedema, not elsewhere classified  Malignant neoplasm of right female breast, unspecified estrogen receptor status, unspecified site of breast ([place])  Malignant neoplasm of left female breast, unspecified estrogen receptor status, unspecified site of breast [facility])     Problem List Patient Active Problem List   Diagnosis Date Noted  . Bilateral breast cancer ([place]) 03/24/2020  . Hypertension 10/20/2017  .  History of squamous cell carcinoma of skin 09/12/2016  . 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 OSA (obstructive sleep apnea) 01/18/2013  . Adjustment disorder with mixed anxiety and depressed mood 08/17/2012  . Hot flashes 03/23/2012  . GERD 08/28/2009  . Abnormal glucose 08/28/2009  . PARESTHESIA 11/11/2008  . History of colonic polyps 11/11/2008  . Benign paroxysmal positional vertigo 10/25/2008  . Hyperlipemia 09/30/2008  . TOBACCO ABUSE 09/30/2008  . Osteoporosis 09/30/2008    Allyson Sabal [facility] 10/17/2020, 12:18 PM  [place], [place], [address] Phone: [phone]   Fax:  [phone]  Name: [name] [name] MRN: [id number] Date of Birth: 1956-01-14  Manus Gunning, PT 10/17/20 12:18 PM

## 2020-10-18 ENCOUNTER — Encounter: Payer: BC Managed Care – PPO | Admitting: Physical Therapy

## 2020-10-18 DIAGNOSIS — D0462 Carcinoma in situ of skin of left upper limb, including shoulder: Secondary | ICD-10-CM | POA: Diagnosis not present

## 2020-10-18 DIAGNOSIS — L821 Other seborrheic keratosis: Secondary | ICD-10-CM | POA: Diagnosis not present

## 2020-10-18 DIAGNOSIS — C44321 Squamous cell carcinoma of skin of nose: Secondary | ICD-10-CM | POA: Diagnosis not present

## 2020-10-18 LAB — CERVICOVAGINAL ANCILLARY ONLY
Bacterial Vaginitis (gardnerella): NEGATIVE
Candida Glabrata: NEGATIVE
Candida Vaginitis: NEGATIVE
Comment: NEGATIVE
Comment: NEGATIVE
Comment: NEGATIVE

## 2020-10-24 ENCOUNTER — Ambulatory Visit: Payer: BC Managed Care – PPO | Admitting: Physical Therapy

## 2020-10-26 ENCOUNTER — Encounter: Payer: BC Managed Care – PPO | Admitting: Physical Therapy

## 2020-10-31 ENCOUNTER — Ambulatory Visit: Payer: BC Managed Care – PPO | Admitting: Physical Therapy

## 2020-10-31 ENCOUNTER — Other Ambulatory Visit: Payer: Self-pay

## 2020-10-31 DIAGNOSIS — L599 Disorder of the skin and subcutaneous tissue related to radiation, unspecified: Secondary | ICD-10-CM | POA: Diagnosis not present

## 2020-10-31 DIAGNOSIS — Z483 Aftercare following surgery for neoplasm: Secondary | ICD-10-CM | POA: Diagnosis not present

## 2020-10-31 DIAGNOSIS — C50912 Malignant neoplasm of unspecified site of left female breast: Secondary | ICD-10-CM | POA: Diagnosis not present

## 2020-10-31 DIAGNOSIS — C50911 Malignant neoplasm of unspecified site of right female breast: Secondary | ICD-10-CM | POA: Diagnosis not present

## 2020-10-31 DIAGNOSIS — I890 Lymphedema, not elsewhere classified: Secondary | ICD-10-CM | POA: Diagnosis not present

## 2020-10-31 DIAGNOSIS — R293 Abnormal posture: Secondary | ICD-10-CM | POA: Diagnosis not present

## 2020-10-31 DIAGNOSIS — M25612 Stiffness of left shoulder, not elsewhere classified: Secondary | ICD-10-CM

## 2020-10-31 NOTE — Therapy (Signed)
[place], [place], [address] Phone: [phone]   Fax:  [phone]  Physical Therapy Treatment  Patient Details  Name: [name] [name] MRN: [id number] Date of Birth: [birth year] Referring Provider (PT): [name]   Encounter Date: 10/31/2020   PT End of Session - 10/31/20 1601    Visit Number 20    Number of Visits 23    Date for PT Re-Evaluation 11/14/20    PT Start Time 1505    PT Stop Time 1556    PT Time Calculation (min) 51 min    Activity Tolerance Patient tolerated treatment well    Behavior During Therapy [facility] for tasks assessed/performed           Past Medical History:  Diagnosis Date  . Abnormal glucose   . Anxiety    05/05/20  . Arthritis   . Breast cancer ([place]) 03/2020  . Esophageal stricture    Dr.Dora Olevia Perches  . GERD (gastroesophageal reflux disease)   . History of colonic polyps   . Hyperlipidemia   . Hypertension   . MVP (mitral valve prolapse)    history of   . Osteoporosis   . Tobacco abuse    tried Chantix- caused agression    Past Surgical History:  Procedure Laterality Date  . APPENDECTOMY  2008  . AXILLARY SENTINEL NODE BIOPSY Right 05/09/2020   Procedure: RIGHT AXILLARY SENTINEL NODE BIOPSY;  Surgeon: Alphonsa Overall, MD;  Location: [place];  Service: General;  Laterality: Right;  . BREAST BIOPSY  2000  . BREAST BIOPSY Bilateral 2021  . BREAST LUMPECTOMY WITH RADIOACTIVE SEED LOCALIZATION Right 05/09/2020   Procedure: RIGHT BREAST LUMPECTOMY WITH RADIOACTIVE SEED;  Surgeon: Alphonsa Overall, MD;  Location: [place];  Service: General;  Laterality: Right;  . COLONOSCOPY    . ESOPHAGOGASTRODUODENOSCOPY    . MASTECTOMY, PARTIAL Left 05/09/2020   Procedure: LEFT MASECTOMY;  Surgeon: Alphonsa Overall, MD;  Location: [place];  Service: General;  Laterality: Left;  . SENTINEL NODE BIOPSY Left 05/09/2020   Procedure: LEFT AXILLARY SENTINEL NODE BIOPSY;  Surgeon: Alphonsa Overall, MD;   Location: [place];  Service: General;  Laterality: Left;  . 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 vocal cord polyps removed     [facility], Dr Erik Obey    There were no vitals filed for this visit.   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 No/denies    Pain Score 0-No pain                             [facility] Adult PT Treatment/Exercise - 10/31/20 0001      Manual Therapy   Soft tissue mobilization to pec muscles to help decrease tightness and improve comfort with several trigger points noted and tight bands of tissue while moving UE through end range of flexion    Passive ROM in supine to L shoulder in to  flexion and abduction with prolonged holds. Pt had no impingement like pain today                       PT Long Term Goals - 10/17/20 1107      PT LONG TERM GOAL #1   Title Pt will return to baseline ROM measurements to allow pt to return to PLOF.    Time 8    Period Weeks    Status Achieved      PT LONG TERM GOAL #2   Title Pt will demonstate 165 degrees of bilateral shoulder flexion to allow her to reach overhead    Baseline R 160, L 144; 07/03/20- R 165 L 168    Time 5    Period Weeks    Status Achieved      PT LONG TERM GOAL #3   Title Pt will demonstrate 165 degrees of bilateral shoulder abduction to allow pt to reach out to the side    Baseline R 140 L 111; 07/03/20- R 178 L 179    Time 5    Period Weeks    Status Achieved      PT LONG TERM GOAL #4    Title Pt will report no increased skin sensitivty at anterior chest to allow improved comfort    Time 5    Period Weeks    Status Achieved      PT LONG TERM GOAL #5   Title Pt will be independent in a home exercise program for long term stretching and strengthening    Time 4    Period Weeks    Status On-going      PT LONG TERM GOAL #6   Title Pt will report 75% improvement in muscle tightness across in left pec and left lateral trunk to allow improved comfort.    Baseline 10/17/20- 90% improvement    Time 4    Period Weeks    Status Achieved      PT LONG TERM GOAL #7   Title Pt will be independent in self MLD for long term management of swelling.    Time 4    Period Weeks    Status Achieved                 Plan - 10/31/20 1557    Clinical Impression Statement Pt was not feeling like exercising today so focused on stretching L axilla in area of tightness to improve ROM. Pt still has several tight bands in L pec that lead to tightness at end range of motion. No cording easily palpable today in L axilla. Will instruct in Strength ABC program at next session.    PT Frequency 1x / week    PT Duration 4 weeks    PT Treatment/Interventions ADLs/Self Care Home Management;Therapeutic exercise;Patient/family education;Manual lymph drainage;Manual techniques;Taping;Passive range of motion;Scar mobilization    PT Next Visit Plan instruct in ABC strengthening program    PT Home Exercise Plan post op breast exercises., supine dowel exercises; end ROM stretching in doorway, supine scap,Access Code: [email or web address]    Consulted and Agree with Plan of Care Patient           Patient will benefit from skilled therapeutic intervention in order to improve the following deficits and impairments:  Postural dysfunction,Decreased knowledge of precautions,Increased fascial restricitons,Decreased scar mobility,Increased edema,Decreased strength  Visit Diagnosis: Stiffness of left shoulder, not elsewhere  classified  Aftercare following surgery for neoplasm  Disorder of the  skin and subcutaneous tissue related to radiation, unspecified     Problem List Patient Active Problem List   Diagnosis Date Noted  . Bilateral breast cancer ([place]) 03/24/2020  . Hypertension 10/20/2017  . History of squamous cell carcinoma of skin 09/12/2016  . 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 OSA (obstructive sleep apnea) 01/18/2013  . Adjustment disorder with mixed anxiety and depressed mood 08/17/2012  . Hot flashes 03/23/2012  . GERD 08/28/2009  . Abnormal glucose 08/28/2009  . PARESTHESIA 11/11/2008  . History of colonic polyps 11/11/2008  . Benign paroxysmal positional vertigo 10/25/2008  . Hyperlipemia 09/30/2008  . TOBACCO ABUSE 09/30/2008  . Osteoporosis 09/30/2008    Allyson Sabal [facility] 10/31/2020, 4:04 PM  [place], [place], [address] Phone: [phone]   Fax:  [phone]  Name: LATEKA RADY MRN: [id number] Date of Birth: Jul 31, 1955  Manus Gunning, PT 10/31/20 4:04 PM

## 2020-11-06 ENCOUNTER — Encounter: Payer: BC Managed Care – PPO | Admitting: Adult Health

## 2020-11-09 ENCOUNTER — Other Ambulatory Visit: Payer: Self-pay

## 2020-11-09 ENCOUNTER — Encounter: Payer: Self-pay | Admitting: Adult Health

## 2020-11-09 ENCOUNTER — Inpatient Hospital Stay: Payer: BC Managed Care – PPO | Attending: Adult Health | Admitting: Adult Health

## 2020-11-09 VITALS — BP 147/84 | HR 71 | Temp 97.5°F | Resp 18 | Ht 65.0 in | Wt 204.6 lb

## 2020-11-09 DIAGNOSIS — C50811 Malignant neoplasm of overlapping sites of right female breast: Secondary | ICD-10-CM | POA: Diagnosis not present

## 2020-11-09 DIAGNOSIS — C50412 Malignant neoplasm of upper-outer quadrant of left female breast: Secondary | ICD-10-CM | POA: Insufficient documentation

## 2020-11-09 DIAGNOSIS — Z9012 Acquired absence of left breast and nipple: Secondary | ICD-10-CM | POA: Diagnosis not present

## 2020-11-09 DIAGNOSIS — Z923 Personal history of irradiation: Secondary | ICD-10-CM | POA: Insufficient documentation

## 2020-11-09 DIAGNOSIS — Z170 Estrogen receptor positive status [ER+]: Secondary | ICD-10-CM | POA: Insufficient documentation

## 2020-11-09 DIAGNOSIS — R911 Solitary pulmonary nodule: Secondary | ICD-10-CM

## 2020-11-09 DIAGNOSIS — C50911 Malignant neoplasm of unspecified site of right female breast: Secondary | ICD-10-CM

## 2020-11-09 DIAGNOSIS — C50912 Malignant neoplasm of unspecified site of left female breast: Secondary | ICD-10-CM

## 2020-11-09 DIAGNOSIS — R918 Other nonspecific abnormal finding of lung field: Secondary | ICD-10-CM | POA: Insufficient documentation

## 2020-11-09 NOTE — Patient Instructions (Signed)
Alendronate Oral Solution What is this medicine? ALENDRONATE (a LEN droe nate) slows calcium loss from bones. It treatsosteoporosis. It may be used in other people at risk for bone loss. This medicine may be used for other purposes; ask your health care provider or pharmacist if you have questions. COMMON BRAND NAME(S): Fosamax What should I tell my health care provider before I take this medicine? They need to know if you have any of these conditions:  bleeding disorder  cancer  dental disease  difficulty swallowing  infection (fever, chills, cough, sore throat, pain or trouble passing urine)  kidney disease  low levels of calcium or other minerals in the blood  low red blood cell counts  receiving steroids like dexamethasone or prednisone  stomach or intestine problems  trouble sitting or standing for 30 minutes  an unusual or allergic reaction to alendronate, other drugs, foods, dyes or preservatives  pregnant or trying to get pregnant  breast-feeding How should I use this medicine? Take this drug by mouth with a full glass of water. Take it as directed on the prescription label at the same time every day. Use a specially marked oral syringe, spoon, or dropper to measure each dose. Ask your pharmacist if you do not have one. Household spoons are not accurate. Take the dose right after waking up. Do not eat or drink anything before taking it. Do not take it with any other drink except water. After taking it, do not eat breakfast, drink, or take any other drugs or vitamins for at least 30 minutes. Sit or stand up for at least 30 minutes after you take it. Do not lie down. Keep taking it unless your health care provider tells you to stop. A special MedGuide will be given to you by the pharmacist with each prescription and refill. Be sure to read this information carefully each time. Talk to your health care provider about the use of this drug in children. Special care may be  needed. Overdosage: If you think you have taken too much of this medicine contact a poison control center or emergency room at once. NOTE: This medicine is only for you. Do not share this medicine with others. What if I miss a dose? If you take your drug once a day, skip it. Take your next dose at the scheduled time the next morning. Do not take two doses on the same day. If you take your drug once a week, take the missed dose on the morning after you remember. Do not take two doses on the same day. What may interact with this medicine?  aluminum hydroxide  antacids  aspirin  calcium supplements  drugs for inflammation like ibuprofen, naproxen, and others  iron supplements  magnesium supplements  vitamins with minerals This list may not describe all possible interactions. Give your health care provider a list of all the medicines, herbs, non-prescription drugs, or dietary supplements you use. Also tell them if you smoke, drink alcohol, or use illegal drugs. Some items may interact with your medicine. What should I watch for while using this medicine? Visit your health care provider for regular checks on your progress. It may be some time before you see the benefit from this drug. Some people who take this drug have severe bone, joint, or muscle pain. This drug may also increase your risk for jaw problems or a broken thigh bone. Tell your health care provider right away if you have severe pain in your jaw, bones, joints,  or muscles. Tell you health care provider if you have any pain that does not go away or that gets worse. Tell your dentist and dental surgeon that you are taking this drug. You should not have major dental surgery while on this drug. See your dentist to have a dental exam and fix any dental problems before starting this drug. Take good care of your teeth while on this drug. Make sure you see your dentist for regular follow-up appointments. You should make sure you get  enough calcium and vitamin D while you are taking this drug. Discuss the foods you eat and the vitamins you take with your health care provider. You may need blood work done while you are taking this drug. What side effects may I notice from receiving this medicine? Side effects that you should report to your doctor or health care provider as soon as possible:  allergic reactions (skin rash, itching or hives; swelling of the face, lips, or tongue)  bone pain  heartburn (burning feeling in chest, often after eating or when lying down)  jaw pain, especially after dental work  joint pain  low calcium levels (fast heartbeat; muscle cramps or pain; pain, tingling, or numbness in the hands or feet; seizures)  muscle pain  painful or difficulty swallowing  redness, blistering, peeling, or loosening of the skin, including inside the mouth  stomach bleeding (bloody or black, tarry stools; spitting up blood or brown material that looks like coffee grounds) Side effects that usually do not require medical attention (report to your doctor or health care provider if they continue or are bothersome):  constipation  diarrhea  nausea This list may not describe all possible side effects. Call your doctor for medical advice about side effects. You may report side effects to FDA at 1-800-FDA-1088. Where should I keep my medicine? Keep out of the reach of children and pets. Store at room temperature between 20 and 25 degrees C (68 and 77 degrees F). Do not freeze. Throw away any unused drug after the expiration date. NOTE: This sheet is a summary. It may not cover all possible information. If you have questions about this medicine, talk to your doctor, pharmacist, or health care provider.  2021 Elsevier/Gold Standard (2019-05-19 20:38:30)

## 2020-11-09 NOTE — Progress Notes (Signed)
SURVIVORSHIP  VISIT:    BRIEF ONCOLOGIC HISTORY:  Oncology History  Bilateral breast cancer ([place])  03/15/2020 Initial Diagnosis   Right breast, 0.9cm right breast mass at the 12 o'clock position, and no axillary adenopathy. grade 1 invasive ductal carcinoma with high grade DCIS, HER-2 equivocal by IHC, negative by FISH, ER+ 100%, PR+ 100%, Ki67 2% T1BN0 stage Ia left breast: 2.5cm mass in the left breast at the 1 o'clock position with likely skin involvement in the nipple-areola complex, Invasive lobular carcinoma, grade 2, HER-2 negative (1+), ER+ 80%, PR+ 50%, Ki67 2%. T4N0 stage IIIb   05/09/2020 Surgery   Right lumpectomy and left mastectomy [name]) [phone]): Right breast: invasive and in situ ductal carcinoma, 1.0cm, clear margins, 2 right axillary lymph nodes negative for carcinoma Left breast: invasive and in situ lobular carcinoma, 6.0cm, clear margins, 1/2 left axillary lymph nodes with isolated tumor cells.   05/09/2020 Cancer Staging   Staging form: Breast, AJCC 8th Edition - Pathologic stage from 05/09/2020: Stage IA (pT1b, pN0, cM0, G1, ER+, PR+, HER2-)   05/18/2020 Cancer Staging   Staging form: Breast, AJCC 8th Edition - Pathologic stage from 05/18/2020: Stage IIIA (pT4, pN0(mol+), cM0, G2, ER+, PR+, HER2-)   06/15/2020 Oncotype testing   Oncotype score: 2, distant recurrence at 9 years: 3%   07/03/2020 - 08/21/2020 Radiation Therapy   The patient initially received a dose of 50.40 Gy in 28 fractions to the RIGHT breast, LEFT chest wall, and LEFT supraclavicular area. using whole-breast tangent fields. This was delivered using a 3-D conformal technique. The pt received a boost delivering an additional 10 Gy in 5 fractions to the LEFT chest wall and RIGHT breast using a electron boost with 68mV electrons.   08/2020 - 08/2027 Anti-estrogen oral therapy   Anastrozole     INTERVAL HISTORY:  [name] [name] review her survivorship care plan detailing her treatment course for  breast cancer, as well as monitoring long-term side effects of that treatment, education regarding health maintenance, screening, and overall wellness and health promotion.     Overall, [name] [name] feeling quite well.  She has not yet started Anastrozole.  She has ostoeporosis and has to undergo dental work after MOHS surgery and anticipates this to happen next month.  Otherwise she is feeling well.    REVIEW OF SYSTEMS:  Review of Systems  Constitutional: Negative for appetite change, chills, fatigue, fever and unexpected weight change.  HENT:   Negative for hearing loss, lump/mass and trouble swallowing.   Eyes: Negative for eye problems and icterus.  Respiratory: Negative for chest tightness, cough and shortness of breath.   Cardiovascular: Negative for chest pain, leg swelling and palpitations.  Gastrointestinal: Negative for abdominal distention, abdominal pain, constipation, diarrhea, nausea and vomiting.  Endocrine: Negative for hot flashes.  Genitourinary: Negative for difficulty urinating.   Musculoskeletal: Negative for arthralgias.  Skin: Negative for itching and rash.  Neurological: Negative for dizziness, extremity weakness, headaches and numbness.  Hematological: Negative for adenopathy. Does not bruise/bleed easily.  Psychiatric/Behavioral: Negative for depression. The patient is not nervous/anxious.   Breast: Denies any new nodularity, masses, tenderness, nipple changes, or nipple discharge.      ONCOLOGY TREATMENT TEAM:  1. Surgeon:  Dr. NLucia [facility]ins[facility] 2. Medical Oncologist: Dr. GLindi Adie 3. Radiation Oncologist: Dr. MLisbeth Renshaw   PAST MEDICAL/SURGICAL HISTORY:  Past Medical History:  Diagnosis Date  . Abnormal glucose   . Anxiety    05/05/20  . Arthritis   .  Breast cancer ([place]) 03/2020  . Esophageal stricture    Dr.Dora Olevia Perches  . GERD (gastroesophageal reflux disease)   . History of colonic polyps   . Hyperlipidemia   . Hypertension   .  MVP (mitral valve prolapse)    history of   . Osteoporosis   . Tobacco abuse    tried Chantix- caused agression   Past Surgical History:  Procedure Laterality Date  . APPENDECTOMY  2008  . AXILLARY SENTINEL NODE BIOPSY Right 05/09/2020   Procedure: RIGHT AXILLARY SENTINEL NODE BIOPSY;  Surgeon: Alphonsa Overall, MD;  Location: [place];  Service: General;  Laterality: Right;  . BREAST BIOPSY  2000  . BREAST BIOPSY Bilateral 2021  . BREAST LUMPECTOMY WITH RADIOACTIVE SEED LOCALIZATION Right 05/09/2020   Procedure: RIGHT BREAST LUMPECTOMY WITH RADIOACTIVE SEED;  Surgeon: Alphonsa Overall, MD;  Location: [place];  Service: General;  Laterality: Right;  . COLONOSCOPY    . ESOPHAGOGASTRODUODENOSCOPY    . MASTECTOMY, PARTIAL Left 05/09/2020   Procedure: LEFT MASECTOMY;  Surgeon: Alphonsa Overall, MD;  Location: [place];  Service: General;  Laterality: Left;  . SENTINEL NODE BIOPSY Left 05/09/2020   Procedure: LEFT AXILLARY SENTINEL NODE BIOPSY;  Surgeon: Alphonsa Overall, MD;  Location: [place];  Service: General;  Laterality: Left;  . 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 vocal cord polyps removed     benigh, Dr Erik Obey     ALLERGIES:  Allergies  Allergen Reactions  . Penicillins Other (See Comments)    whelps, ,skin hot to touch     CURRENT MEDICATIONS:  Outpatient Encounter Medications as of 11/09/2020  Medication Sig  . albuterol (VENTOLIN HFA) 108 (90 Base) MCG/ACT inhaler INHALE 2 PUFFS BY MOUTH EVERY 6 HOURS AS NEEDED (Patient taking differently: Inhale 2 puffs into the lungs every 6 (six) hours as needed for wheezing or shortness of breath.)  . amLODipine (NORVASC) 2.5 MG tablet Take 1 tablet (2.5 mg total) by mouth daily. (Patient taking differently: Take 2.5 mg by mouth every morning.)  . atorvastatin (LIPITOR) 40 MG tablet Take 1 tablet (40 mg total) by mouth daily. (Patient taking differently: Take 40 mg by mouth every morning.)  . azelastine (ASTELIN) 0.1 % nasal spray Place 2  sprays into both nostrils 2 (two) times daily. (Patient taking differently: Place 2 sprays into both nostrils 2 (two) times daily as needed (congestion/allergies.).)  . citalopram (CELEXA) 20 MG tablet Take 1 tablet (20 mg total) by mouth daily. (Patient taking differently: Take 20 mg by mouth every morning.)  . esomeprazole (NEXIUM) 40 MG capsule TAKE 1 CAPSULE BY MOUTH TWICE A DAY BEFORE A MEAL (Patient taking differently: Take 40 mg by mouth in the morning and at bedtime. TAKE 1 CAPSULE BY MOUTH TWICE A DAY BEFORE A MEAL  >>>>>>CAN ONLY TAKE NEXIUM - BRAND NAME - NO GENERIC<<<<<)  . anastrozole (ARIMIDEX) 1 MG tablet Take 1 tablet (1 mg total) by mouth daily. (Patient not taking: Reported on 11/09/2020)  . [DISCONTINUED] promethazine (PHENERGAN) 25 MG tablet Take 1 tablet (25 mg total) by mouth every 6 (six) hours as needed for nausea or vomiting.   No facility-administered encounter medications on file as of 11/09/2020.     ONCOLOGIC FAMILY HISTORY:  Family History  Problem Relation Age of Onset  . Hyperlipidemia Other   . Hiatal hernia Other        several family members  . Hypertension Mother   . Cancer Mother  apendix. right hemicolectomy  . Other Mother        MGUS  . Alcohol abuse Father   . Cirrhosis Father        deceased form cirrhosis  . Hypertension Father   . Other Other        Fh of abnormal glucose  . Hypertension Sister   . Hypertension Brother   . Colon cancer Neg Hx   . Esophageal cancer Neg Hx   . Rectal cancer Neg Hx      GENETIC COUNSELING/TESTING: Not at this time  SOCIAL HISTORY:  Social History   Socioeconomic History  . Marital status: Married    Spouse name: Not on file  . Number of children: 2  . Years of education: Not on file  . Highest education level: Not on file  Occupational History  . Occupation: Chartered certified accountant   Tobacco Use  . Smoking status: Current Every Day Smoker    Packs/day: 1.00    Years: 40.00    Pack  years: 40.00    Types: Cigarettes    Start date: 07/15/1978  . Smokeless tobacco: Never Used  Vaping Use  . Vaping Use: Never used  Substance and Sexual Activity  . Alcohol use: No    Alcohol/week: 0.0 standard drinks  . Drug use: No  . Sexual activity: Not Currently  Other Topics Concern  . Not on file  Social History Narrative   Married over 30 years. Sons 29,35 in 2017. 2 grandkis- 1 grandaughter - [place] Haw . 1 grandson. Hudson lewis. All grandkids same son      Cared for mother before death in 2637, husband alcoholic- tremendous stress. Sister stays with mom- stressor      Occupation: office admin - [employer]    Social Determinants of Health   Financial Resource Strain: Not on file  Food Insecurity: Not on file  Transportation Needs: Not on file  Physical Activity: Not on file  Stress: Not on file  Social Connections: Not on file  Intimate Partner Violence: Not on file     OBSERVATIONS/OBJECTIVE:   BP (!) 147/84 (BP Location: Left Wrist, Patient Position: Sitting)   Pulse 71   Temp (!) 97.5 F (36.4 C) (Tympanic)   Resp 18   Ht '5\' 5"'  (1.651 m)   Wt 204 lb 9.6 oz (92.8 kg)   SpO2 92%   BMI 34.05 kg/m  GENERAL: Patient is a well appearing female in no acute distress HEENT:  Sclerae anicteric.  Oropharynx clear and moist. No ulcerations or evidence of oropharyngeal candidiasis. Neck is supple.  NODES:  No cervical, supraclavicular, or axillary lymphadenopathy palpated.  BREAST EXAM:  Left breast s/p mastectomy and radiation, left breast s/p lumpectomy and radiation, no sign of local recurrence.   LUNGS:  Clear to auscultation bilaterally.  No wheezes or rhonchi. HEART:  Regular rate and rhythm. No murmur appreciated. ABDOMEN:  Soft, nontender.  Positive, normoactive bowel sounds. No organomegaly palpated. MSK:  No focal spinal tenderness to palpation. Full range of motion bilaterally in the upper extremities. EXTREMITIES:  No peripheral edema.   SKIN:   Clear with no obvious rashes or skin changes. No nail dyscrasia. NEURO:  Nonfocal. Well oriented.  Appropriate affect.    LABORATORY DATA:  None for this visit.  DIAGNOSTIC IMAGING:  None for this visit.      ASSESSMENT AND PLAN:  Ms.. [name] is a pleasant 65 y.o. female with bilateral breast cancer, ER+/PR+/HER2-, diagnosed in 03/2020, treated with  left mastectomy and right lumpectomy, adjuvant radiation therapy, and anti-estrogen therapy with Anastrozole beginning in 08/2020.  She presents to the Survivorship Clinic for our initial meeting and routine follow-up post-completion of treatment for breast cancer.    1. Bilateral breast cancer:  [name] [name] is continuing to recover from definitive treatment for breast cancer. She will follow-up with her medical oncologist, Dr. Lindi Adie in 6 months with history and physical exam per surveillance protocol.  She will continue her anti-estrogen therapy with Anastrozole.  She has not yet started Anastrozole due to upcoming MOHS surgery and dental work.  I asked that if she thinks it is going to be more than 3 months before starting it to let us know and we will send in Tamoxifen   Her right breast mammogram is due 02/2021; orders placed today.  She is very unceratin about the tissue in her left axilla, and we will get baseline ultrasound in 02/2021 with her mammogram.  Today, a comprehensive survivorship care plan and treatment summary was reviewed with the patient today detailing her breast cancer diagnosis, treatment course, potential late/long-term effects of treatment, appropriate follow-up care with recommendations for the future, and patient education resources.  A copy of this summary, along with a letter will be sent to the patient's primary care provider via mail/fax/In Basket message after today's visit.    2. Lung nodules on CT- I placed order for repeat CT chest in 01/2021 to follow up on these as recommended.  She is a current smoker, and once cleared  from regular CT scans should undergo lung cancer screening.    3. Bone health:  Given [name] [name] age/history of breast cancer and her current treatment regimen including anti-estrogen therapy with Anastrozole, she is at risk for bone demineralization.  Her last DEXA scan was 02/2020, which showed osteoporosis with a T score in the AP spine of -2.7.  She is due for repeat bone density testing in 02/2022.  She has received Reclast which she did not tolerate, and does not want to receive Prolia.  She and I discussed oral bisphosphanate therapy, and she can discuss further with Dr. Lindi Adie at her appt with him in 6 months.  In the meantime, she was encouraged to increase her consumption of foods rich in calcium, as well as increase her weight-bearing activities.  She was given education on specific activities to promote bone health.  4. Cancer screening:  Due to [name] [name] history and her age, she should receive screening for lung cancer, skin cancers, colon cancer, and gynecologic cancers.  The information and recommendations are listed on the patient's comprehensive care plan/treatment summary and were reviewed in detail with the patient.    5. Health maintenance and wellness promotion: [name] [name] was encouraged to consume 5-7 servings of fruits and vegetables per day. We reviewed the "Nutrition Rainbow" handout, as well as the handout "Take Control of Your Health and Reduce Your Cancer Risk" from the [place].  She was also encouraged to engage in moderate to vigorous exercise for 30 minutes per day most days of the week. We discussed the [employer] fitness program, which is designed for cancer survivors to help them become more physically fit after cancer treatments.  She was instructed to limit her alcohol consumption and was encouraged stop smoking.     6. Support services/counseling: It is not uncommon for this period of the patient's cancer care trajectory to be one of many emotions and  stressors.  We discussed  how this can be increasingly difficult during the times of quarantine and social distancing due to the COVID-19 pandemic.   She was given information regarding our available services and encouraged to contact me with any questions or for help enrolling in any of our support group/programs.    Follow up instructions:    -Return to cancer center in 6 months for f/u  -Mammogram and left ultrasound due in 02/2021 -Bone density due in 02/2022 -CT chest lung nodule f/u in 01/2021 -Follow up with surgery June and then in April annually -She is welcome to return back to the Survivorship Clinic at any time; no additional follow-up needed at this time.  -Consider referral back to survivorship as a long-term survivor for continued surveillance  The patient was provided an opportunity to ask questions and all were answered. The patient agreed with the plan and demonstrated an understanding of the instructions.   Total encounter time: 77 minutes meeting with patient as noted above, formulating SCP, communicating with oncology, surgery, and PCP.    Wilber Bihari, NP 11/09/20 9:34 AM Medical Oncology and Hematology [facility] [place], [place] [address] Tel. [phone]    Fax. [phone]  *Total Encounter Time as defined by the Centers for Medicare and Medicaid Services includes, in addition to the face-to-face time of a patient visit (documented in the note above) non-face-to-face time: obtaining and reviewing outside history, ordering and reviewing medications, tests or procedures, care coordination (communications with other health care professionals or caregivers) and documentation in the medical record.

## 2020-11-13 ENCOUNTER — Telehealth: Payer: Self-pay | Admitting: Hematology and Oncology

## 2020-11-13 NOTE — Telephone Encounter (Signed)
Scheduled per 4/28 los. Called pt and left a msg 

## 2020-11-15 ENCOUNTER — Ambulatory Visit: Payer: BC Managed Care – PPO | Admitting: Physical Therapy

## 2020-11-16 DIAGNOSIS — C44321 Squamous cell carcinoma of skin of nose: Secondary | ICD-10-CM | POA: Diagnosis not present

## 2020-11-23 DIAGNOSIS — D0462 Carcinoma in situ of skin of left upper limb, including shoulder: Secondary | ICD-10-CM | POA: Diagnosis not present

## 2020-11-29 ENCOUNTER — Encounter: Payer: Self-pay | Admitting: Physical Therapy

## 2020-12-05 ENCOUNTER — Encounter: Payer: Self-pay | Admitting: Physical Therapy

## 2020-12-13 DIAGNOSIS — C50911 Malignant neoplasm of unspecified site of right female breast: Secondary | ICD-10-CM | POA: Diagnosis not present

## 2020-12-13 DIAGNOSIS — Z170 Estrogen receptor positive status [ER+]: Secondary | ICD-10-CM | POA: Diagnosis not present

## 2020-12-13 DIAGNOSIS — C50912 Malignant neoplasm of unspecified site of left female breast: Secondary | ICD-10-CM | POA: Diagnosis not present

## 2020-12-18 ENCOUNTER — Encounter: Payer: Self-pay | Admitting: Physical Therapy

## 2021-01-08 ENCOUNTER — Telehealth: Payer: Self-pay | Admitting: *Deleted

## 2021-01-08 MED ORDER — ESOMEPRAZOLE MAGNESIUM 40 MG PO CPDR
40.0000 mg | DELAYED_RELEASE_CAPSULE | Freq: Two times a day (BID) | ORAL | 2 refills | Status: DC
Start: 2021-01-08 — End: 2022-02-21

## 2021-01-08 NOTE — Telephone Encounter (Signed)
Rs refill request by fax sent electronically today

## 2021-01-12 ENCOUNTER — Telehealth: Payer: Self-pay | Admitting: *Deleted

## 2021-01-12 NOTE — Telephone Encounter (Signed)
Submitted new prior authorization to Cover My Meds today

## 2021-01-18 NOTE — Telephone Encounter (Signed)
Nexium approved until 01/12/2022 through Cover My Meds

## 2021-01-22 ENCOUNTER — Ambulatory Visit: Payer: BC Managed Care – PPO

## 2021-02-02 ENCOUNTER — Encounter: Payer: Self-pay | Admitting: Hematology and Oncology

## 2021-02-02 ENCOUNTER — Inpatient Hospital Stay: Payer: BC Managed Care – PPO | Attending: Adult Health | Admitting: Licensed Clinical Social Worker

## 2021-02-02 DIAGNOSIS — C50911 Malignant neoplasm of unspecified site of right female breast: Secondary | ICD-10-CM

## 2021-02-02 DIAGNOSIS — C50912 Malignant neoplasm of unspecified site of left female breast: Secondary | ICD-10-CM

## 2021-02-02 NOTE — Progress Notes (Signed)
[place] CSW Progress Note  Clinical Education officer, museum received TC from patient for emotional/coping support in survivorship. She has been struggling with adjusting to her new normal after finishing treatment and is looking for available supports.  Reports increased stress with upcoming scans as well. CSW normalized feelings and began discussion on ways to cope and find joy.  Discussed various resources available in terms of short-term visits with CSW, referral for ongoing counseling, and connection with [facility] or [employer].  Patient has attended Breast Cancer Support Group and found it to be helpful. Signed up for monthly calendar mailing list to explore other programs available through [facility] and [facility]. Signed up for [facility] interest list for Winter 2023.  Follow-up: phone visit on 8/4   [place]

## 2021-02-05 ENCOUNTER — Other Ambulatory Visit: Payer: Self-pay

## 2021-02-05 ENCOUNTER — Ambulatory Visit: Payer: BC Managed Care – PPO | Attending: Surgery

## 2021-02-05 VITALS — Wt 208.1 lb

## 2021-02-05 DIAGNOSIS — Z483 Aftercare following surgery for neoplasm: Secondary | ICD-10-CM

## 2021-02-05 NOTE — Therapy (Signed)
[place], [place], [address] Phone: [phone]   Fax:  [phone]  Physical Therapy Treatment  Patient Details  Name: [name] [name] MRN: [id number] Date of Birth: June 14, 1956 Referring Provider (PT): [name]   Encounter Date: 02/05/2021   PT End of Session - 02/05/21 1019     Visit Number 20   # unchanged due to screen only   PT Start Time 1012    PT Stop Time 1020    PT Time Calculation (min) 8 min    Activity Tolerance Patient tolerated treatment well    Behavior During Therapy [facility] for tasks assessed/performed             Past Medical History:  Diagnosis Date   Abnormal glucose    Anxiety    05/05/20   Arthritis    Breast cancer ([place]) 03/2020   Esophageal stricture    Dr.Dora Olevia Perches   GERD (gastroesophageal reflux disease)    History of colonic polyps    Hyperlipidemia    Hypertension    MVP (mitral valve prolapse)    history of    Osteoporosis    Tobacco abuse    tried Chantix- caused agression    Past Surgical History:  Procedure Laterality Date   APPENDECTOMY  2008   AXILLARY SENTINEL NODE BIOPSY Right 05/09/2020   Procedure: RIGHT AXILLARY SENTINEL NODE BIOPSY;  Surgeon: Alphonsa Overall, MD;  Location: [place];  Service: General;  Laterality: Right;   BREAST BIOPSY  2000   BREAST BIOPSY Bilateral 2021   BREAST LUMPECTOMY WITH RADIOACTIVE SEED LOCALIZATION Right 05/09/2020   Procedure: RIGHT BREAST LUMPECTOMY WITH RADIOACTIVE SEED;  Surgeon: Alphonsa Overall, MD;  Location: [place];  Service: General;  Laterality: Right;   COLONOSCOPY     ESOPHAGOGASTRODUODENOSCOPY     MASTECTOMY, PARTIAL Left 05/09/2020   Procedure: LEFT MASECTOMY;  Surgeon: Alphonsa Overall, MD;  Location: [place];  Service: General;  Laterality: Left;   SENTINEL NODE BIOPSY Left 05/09/2020   Procedure: LEFT AXILLARY SENTINEL NODE BIOPSY;  Surgeon: Alphonsa Overall, MD;  Location: [place];  Service: General;  Laterality:  Left;   SKIN GRAFT Right    5h finger 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    Pertinent History bilateral breast cancer, biopsy on 03/15/20 R breast DCIS ER+ PR+, L breast cancer invasive mammary carcinoma ER+ PR+, had L mastectomy and R lumpectomy SLNB bilaterally 05/09/20 - (2 nodes removed bilaterally but 1 node positive on R), plan is to undergo adjuvant radiation bilaterally and antiestrogen therapy, mitral valve prolapse                    L-DEX FLOWSHEETS - 02/05/21 1000       L-DEX LYMPHEDEMA SCREENING   Measurement Type Bilateral    L-DEX MEASUREMENT EXTREMITY Upper Extremity    POSITION  Standing    DOMINANT SIDE Right    At Risk Side --   bil   BASELINE RIGHT 1.1    BASELINE LEFT 1.2    RIGHT SIDE SCORE -0.1    LEFT SIDE SCORE -0.3    VALUE CHANGE RIGHT -1.2    VALUE CHANGE LEFT -1.5  PT Long Term Goals - 10/17/20 1107       PT LONG TERM GOAL #1   Title Pt will return to baseline ROM measurements to allow pt to return to PLOF.    Time 8    Period Weeks    Status Achieved      PT LONG TERM GOAL #2   Title Pt will demonstate 165 degrees of bilateral shoulder flexion to allow her to reach overhead    Baseline R 160, L 144; 07/03/20- R 165 L 168    Time 5    Period Weeks    Status Achieved      PT LONG TERM GOAL #3   Title Pt will demonstrate 165 degrees of bilateral shoulder abduction to allow pt to reach out to the side    Baseline R 140 L 111; 07/03/20- R 178 L 179    Time 5    Period Weeks    Status Achieved      PT LONG TERM GOAL #4   Title Pt will report no increased skin sensitivty at anterior chest to allow improved comfort    Time 5    Period Weeks    Status Achieved      PT LONG TERM  GOAL #5   Title Pt will be independent in a home exercise program for long term stretching and strengthening    Time 4    Period Weeks    Status On-going      PT LONG TERM GOAL #6   Title Pt will report 75% improvement in muscle tightness across in left pec and left lateral trunk to allow improved comfort.    Baseline 10/17/20- 90% improvement    Time 4    Period Weeks    Status Achieved      PT LONG TERM GOAL #7   Title Pt will be independent in self MLD for long term management of swelling.    Time 4    Period Weeks    Status Achieved                   Plan - 02/05/21 1020     Clinical Impression Statement Pt returns for her 3 month L-Dex screen. Her change from baseline of Rt -1.2 and Lt -1.5 is WNLs so no further treatment is required at this time except to cont every 3 month L-Dex screens which pt is agreeable to.    PT Next Visit Plan Cont every 3 month L-Dex screens for uo to 2 years from her SLNB.    Consulted and Agree with Plan of Care Patient             Patient will benefit from skilled therapeutic intervention in order to improve the following deficits and impairments:     Visit Diagnosis: Aftercare following surgery for neoplasm     Problem List Patient Active Problem List   Diagnosis Date Noted   Bilateral breast cancer ([place]) 03/24/2020   Hypertension 10/20/2017   History of squamous cell carcinoma of skin 09/12/2016   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 05/23/2016   OSA (obstructive sleep apnea) 01/18/2013   Adjustment disorder with mixed anxiety and depressed mood 08/17/2012   Hot flashes 03/23/2012   GERD 08/28/2009   Abnormal glucose 08/28/2009   PARESTHESIA 11/11/2008   History of colonic polyps 11/11/2008   Benign paroxysmal positional vertigo 10/25/2008   Hyperlipemia 09/30/2008   TOBACCO ABUSE 09/30/2008   Osteoporosis 09/30/2008    Debbe Bales,  Luther Bradley, PTA 02/05/2021, 10:23 AM  [place], [place], [address] Phone: [phone]   Fax:  [phone]  Name: HOMER MULLIKIN MRN: [id number] Date of Birth: [birth year]

## 2021-02-08 ENCOUNTER — Other Ambulatory Visit: Payer: Self-pay

## 2021-02-08 ENCOUNTER — Encounter (HOSPITAL_COMMUNITY): Payer: Self-pay

## 2021-02-08 ENCOUNTER — Ambulatory Visit (HOSPITAL_COMMUNITY)
Admission: RE | Admit: 2021-02-08 | Discharge: 2021-02-08 | Disposition: A | Discharge status: Home / Self Care | Payer: BC Managed Care – PPO | Source: Ambulatory Visit | Attending: Adult Health | Admitting: Adult Health

## 2021-02-08 DIAGNOSIS — R911 Solitary pulmonary nodule: Secondary | ICD-10-CM | POA: Insufficient documentation

## 2021-02-08 DIAGNOSIS — C50911 Malignant neoplasm of unspecified site of right female breast: Secondary | ICD-10-CM | POA: Diagnosis not present

## 2021-02-08 DIAGNOSIS — C50912 Malignant neoplasm of unspecified site of left female breast: Secondary | ICD-10-CM | POA: Diagnosis not present

## 2021-02-08 DIAGNOSIS — J439 Emphysema, unspecified: Secondary | ICD-10-CM | POA: Diagnosis not present

## 2021-02-08 DIAGNOSIS — I700 Atherosclerosis of aorta: Secondary | ICD-10-CM | POA: Diagnosis not present

## 2021-02-08 DIAGNOSIS — R918 Other nonspecific abnormal finding of lung field: Secondary | ICD-10-CM | POA: Diagnosis not present

## 2021-02-08 LAB — POCT I-STAT CREATININE: Creatinine, Ser: 0.6 mg/dL (ref 0.44–1.00)

## 2021-02-08 MED ORDER — IOHEXOL 350 MG/ML SOLN
100.0000 mL | Freq: Once | INTRAVENOUS | Status: AC | PRN
  Administered 2021-02-08: 65 mL via INTRAVENOUS

## 2021-02-08 MED ORDER — SODIUM CHLORIDE (PF) 0.9 % IJ SOLN
INTRAMUSCULAR | Status: AC
  Filled 2021-02-08: qty 50

## 2021-02-09 ENCOUNTER — Other Ambulatory Visit: Payer: Self-pay

## 2021-02-09 NOTE — Progress Notes (Signed)
RN returned call, voicemail left for call back.  

## 2021-02-12 ENCOUNTER — Encounter: Payer: Self-pay | Admitting: Hematology and Oncology

## 2021-02-12 ENCOUNTER — Telehealth: Payer: Self-pay | Admitting: Hematology and Oncology

## 2021-02-12 NOTE — Telephone Encounter (Signed)
I left a voicemail with the patient that the CT scan of the chest showed stable lung nodules and the postoperative changes from surgery in the left chest wall and axilla as well as fatty liver and emphysema.

## 2021-02-15 ENCOUNTER — Telehealth: Payer: Self-pay

## 2021-02-15 ENCOUNTER — Inpatient Hospital Stay: Payer: BC Managed Care – PPO | Attending: Adult Health | Admitting: Licensed Clinical Social Worker

## 2021-02-15 DIAGNOSIS — C50912 Malignant neoplasm of unspecified site of left female breast: Secondary | ICD-10-CM

## 2021-02-15 DIAGNOSIS — C50911 Malignant neoplasm of unspecified site of right female breast: Secondary | ICD-10-CM

## 2021-02-15 NOTE — Progress Notes (Signed)
[place] CSW Progress Note  Clinical Education officer, museum contacted patient by phone to provide ongoing supportive counseling.  Pt had some relief since last visit after having good results from most recent scan.    Majority of the visit was utilized to allow patient to process experience through cancer as well as how some experiences from earlier in life have impacted her experience. Pt showed good insight and self-awareness. CSW and pt worked on acknowledging that being vulnerable and allowing different emotions is not being "soft" but is a different way of showing strength. Worked on cognitive restructuring as well as normalized feelings. Pt has few people in her life who she feels she can have these deep, emotional conversations with.   Pt is interested in giving back and being able to provide hope to other patients. Discussed potentially being an [employer] guide in the future as patient comes in to her new normal as well as opportunities to volunteer.    Next visit: 8/18 by phone. Will discuss ongoing needs at that time    Christeen Douglas LCSW

## 2021-02-15 NOTE — Telephone Encounter (Signed)
RN returned call - reviewed results of scan re: MD's review.  Pt verbalized understanding.  No further needs at this time.

## 2021-03-01 ENCOUNTER — Inpatient Hospital Stay: Payer: BC Managed Care – PPO | Admitting: Licensed Clinical Social Worker

## 2021-03-01 DIAGNOSIS — C50911 Malignant neoplasm of unspecified site of right female breast: Secondary | ICD-10-CM

## 2021-03-01 NOTE — Progress Notes (Signed)
[facility] CSW Progress Note  Clinical Education officer, museum completed counseling visit via phone with patient. Patient reports today being tough as she is not feeling well physically and feeling a little down. She has had more good days lately where she has felt more productive (watched grandkids, organized) but still feels less clear-headed than before. Still experiencing adjustment post-treatment and processing loss.  This is causing some difficulty in moving forward with other decisions (dental work, early retirement vs disability) as these feel like another representation of loss of control. CSW and patient worked to begin processing and breaking down larger tasks into smaller goals. Patient plans to call dental office this week to see if she feels comfortable with them and to ask questions about the procedure.  Follow-up: final phone visit on 9/1. CSW sent options for ongoing counseling to patient today to review and choose a provider.   Christeen Douglas LCSW

## 2021-03-12 ENCOUNTER — Other Ambulatory Visit: Payer: Self-pay | Admitting: Adult Health

## 2021-03-12 ENCOUNTER — Ambulatory Visit
Admission: RE | Admit: 2021-03-12 | Discharge: 2021-03-12 | Disposition: A | Discharge status: Home / Self Care | Payer: BC Managed Care – PPO | Source: Ambulatory Visit | Attending: Adult Health | Admitting: Adult Health

## 2021-03-12 ENCOUNTER — Other Ambulatory Visit: Payer: Self-pay

## 2021-03-12 DIAGNOSIS — C50912 Malignant neoplasm of unspecified site of left female breast: Secondary | ICD-10-CM

## 2021-03-12 DIAGNOSIS — R921 Mammographic calcification found on diagnostic imaging of breast: Secondary | ICD-10-CM | POA: Diagnosis not present

## 2021-03-12 DIAGNOSIS — R922 Inconclusive mammogram: Secondary | ICD-10-CM | POA: Diagnosis not present

## 2021-03-12 DIAGNOSIS — C50911 Malignant neoplasm of unspecified site of right female breast: Secondary | ICD-10-CM

## 2021-03-12 HISTORY — DX: Personal history of irradiation: Z92.3

## 2021-03-12 LAB — HM MAMMOGRAPHY

## 2021-03-13 DIAGNOSIS — M8580 Other specified disorders of bone density and structure, unspecified site: Secondary | ICD-10-CM | POA: Diagnosis not present

## 2021-03-13 DIAGNOSIS — Z13820 Encounter for screening for osteoporosis: Secondary | ICD-10-CM | POA: Diagnosis not present

## 2021-03-13 DIAGNOSIS — Z6835 Body mass index (BMI) 35.0-35.9, adult: Secondary | ICD-10-CM | POA: Diagnosis not present

## 2021-03-13 DIAGNOSIS — Z01419 Encounter for gynecological examination (general) (routine) without abnormal findings: Secondary | ICD-10-CM | POA: Diagnosis not present

## 2021-03-13 DIAGNOSIS — M818 Other osteoporosis without current pathological fracture: Secondary | ICD-10-CM | POA: Diagnosis not present

## 2021-03-15 ENCOUNTER — Inpatient Hospital Stay: Payer: BC Managed Care – PPO | Attending: Adult Health | Admitting: Licensed Clinical Social Worker

## 2021-03-15 DIAGNOSIS — C50911 Malignant neoplasm of unspecified site of right female breast: Secondary | ICD-10-CM

## 2021-03-15 NOTE — Progress Notes (Signed)
[facility] CSW Progress Note  Clinical Education officer, museum contacted patient by phone to follow-up for supportive counseling.  Patient is having a stressful week as she just had her mammogram and ultrasound and needs to have a biopsy of a spot on her right breast (where she had the lumpectomy instead of the mastectomy). She is trying to take everything as it comes rather than going to worst case scenario.  Patient able to express helpful cognitive processes by stating that she is hoping it is "nothing" but is ready to handle things if it comes back positive.  Patient is still experiencing periods of depressive symptoms but is trying to stay busy with her puppy and is looking forward to a beach trip with her sons and grandbabies at the end of the month.  She was able to identify different self-care strategies that she can utilize this weekend prior to her biopsy.  CSW and patient discussed ongoing support again as this clinician will be going on leave. Pt has not chosen a therapist but will look at the list again. She is open to having our spiritual care reach out if possible- message sent to [facility] today to determine availability.    Christeen Douglas LCSW

## 2021-03-20 ENCOUNTER — Telehealth (INDEPENDENT_AMBULATORY_CARE_PROVIDER_SITE_OTHER): Payer: BC Managed Care – PPO | Admitting: Family Medicine

## 2021-03-20 ENCOUNTER — Encounter: Payer: Self-pay | Admitting: Family Medicine

## 2021-03-20 VITALS — Temp 99.7°F | Wt 204.0 lb

## 2021-03-20 DIAGNOSIS — R059 Cough, unspecified: Secondary | ICD-10-CM

## 2021-03-20 DIAGNOSIS — Z20822 Contact with and (suspected) exposure to covid-19: Secondary | ICD-10-CM

## 2021-03-20 DIAGNOSIS — R0981 Nasal congestion: Secondary | ICD-10-CM | POA: Diagnosis not present

## 2021-03-20 LAB — HM PAP SMEAR

## 2021-03-20 NOTE — Patient Instructions (Addendum)
  HOME CARE TIPS:  -COVID19 testing information: Most pharmacies also offer testing and home test kits. If the Covid19 test is positive and you wish to pursue treatment, please contact one of the [facility] health pharmacies, or schedule a virtual visit with your primary care office or [facility].  Treatment is best given within the first 5 days of symptoms.  -can use tylenol  if needed for fevers, aches and pains per instructions  -can use nasal saline a few times per day if you have nasal congestion  -stay hydrated, drink plenty of fluids and eat small healthy meals - avoid dairy  -can take 1000 IU (13mg) Vit D3 and 100-500 mg of Vit C daily per instructions  -If the Covid test is positive, check out the [facility] for more information on home care, transmission and treatment for COVID19  -follow up with your doctor in 2-3 days unless improving and feeling better  -stay home while sick, except to seek medical care. If you have COVID19, ideally it would be best to stay home for a full 10 days since the onset of symptoms PLUS one day of no fever and feeling better. Wear a good mask that fits snugly (such as N95 or KN95) if around others to reduce the risk of transmission.  It was nice to meet you today, and I really hope you are feeling better soon. I help [facility] out with telemedicine visits on Tuesdays and Thursdays and am available for visits on those days. If you have any concerns or questions following this visit please schedule a follow up visit with your Primary Care doctor or seek care at a local urgent care clinic to avoid delays in care.    Seek in person care or schedule a follow up video visit promptly if your symptoms worsen, new concerns arise or you are not improving with treatment. Call 911 and/or seek emergency care if your symptoms are severe or life threatening.

## 2021-03-20 NOTE — Progress Notes (Signed)
Virtual Visit via Video Note  I connected with Verdis Frederickson  on 03/20/21 at  5:00 PM EDT by a video enabled telemedicine application and verified that I am speaking with the correct person using two identifiers.  Location patient: home, [place] Location provider:work or home office Persons participating in the virtual visit: patient, provider  I discussed the limitations of evaluation and management by telemedicine and the availability of in person appointments. The patient expressed understanding and agreed to proceed.   HPI:  Acute telemedicine visit for possible Covid19: -Onset: today; covid test was negative today -husband is currently sick with [phone] -Symptoms include: fever 101, ear feels full, nasal sinus congestion, headache, cough -Denies: CP, SOB, NVD, inability to eat/drink/get out of bed -Has tried: none -Pertinent past medical history: see below -Pertinent medication allergies:  Allergies  Allergen Reactions   Penicillins Other (See Comments)    whelps, ,skin hot to touch  -COVID-19 vaccine status:vaccinated x2 and did not have a booster -reports is not currently on any treatment for the ca per her report - has not started the arimidex  ROS: See pertinent positives and negatives per HPI.  Past Medical History:  Diagnosis Date   Abnormal glucose    Anxiety    05/05/20   Arthritis    Breast cancer ([place]) 03/2020   Esophageal stricture    Dr.Dora Olevia Perches   GERD (gastroesophageal reflux disease)    History of colonic polyps    Hyperlipidemia    Hypertension    MVP (mitral valve prolapse)    history of    Osteoporosis    Personal history of radiation therapy    Tobacco abuse    tried Chantix- caused agression    Past Surgical History:  Procedure Laterality Date   APPENDECTOMY  2008   AXILLARY SENTINEL NODE BIOPSY Right 05/09/2020   Procedure: RIGHT AXILLARY SENTINEL NODE BIOPSY;  Surgeon: Alphonsa Overall, MD;  Location: [facility];  Service: General;  Laterality: Right;    BREAST BIOPSY  2000   BREAST BIOPSY Bilateral 2021   BREAST LUMPECTOMY WITH RADIOACTIVE SEED LOCALIZATION Right 05/09/2020   Procedure: RIGHT BREAST LUMPECTOMY WITH RADIOACTIVE SEED;  Surgeon: Alphonsa Overall, MD;  Location: [facility];  Service: General;  Laterality: Right;   COLONOSCOPY     ESOPHAGOGASTRODUODENOSCOPY     MASTECTOMY, PARTIAL Left 05/09/2020   Procedure: LEFT MASECTOMY;  Surgeon: Alphonsa Overall, MD;  Location: [facility];  Service: General;  Laterality: Left;   SENTINEL NODE BIOPSY Left 05/09/2020   Procedure: LEFT AXILLARY SENTINEL NODE BIOPSY;  Surgeon: Alphonsa Overall, MD;  Location: [facility];  Service: General;  Laterality: Left;   SKIN GRAFT Right    5h finger 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 (Patient taking differently: Take 2.5 mg by mouth every morning.), Disp: 90 tablet, Rfl: 3   anastrozole (ARIMIDEX) 1 MG tablet, Take 1 tablet (1 mg total) by mouth daily., Disp: 90 tablet, Rfl: 3   atorvastatin (LIPITOR) 40 MG tablet, Take 1 tablet (40 mg total) by mouth daily. (Patient taking  differently: Take 40 mg by mouth every morning.), Disp: 90 tablet, Rfl: 3   azelastine (ASTELIN) 0.1 % nasal spray, Place 2 sprays into both nostrils 2 (two) times daily. (Patient taking differently: Place 2 sprays into both nostrils 2 (two) times daily as needed (congestion/allergies.).), Disp: 30 mL, Rfl: 12   citalopram (CELEXA) 20 MG tablet, Take 20 mg by mouth daily., Disp: , Rfl:    esomeprazole (NEXIUM) 40 MG capsule, Take 1 capsule (40 mg total) by mouth in the morning and at bedtime. TAKE 1 CAPSULE  BY MOUTH TWICE A DAY BEFORE A MEAL  >>>>>>CAN ONLY TAKE NEXIUM - BRAND NAME - NO GENERIC<<<<<, Disp: 180 capsule, Rfl: 2  EXAM:  VITALS per patient if applicable:  GENERAL: alert, oriented, appears well and in no acute distress  HEENT: atraumatic, conjunttiva clear, no obvious abnormalities on inspection of external nose and ears  NECK: normal movements of the head and neck  LUNGS: on inspection no signs of respiratory distress, breathing rate appears normal, no obvious gross SOB, gasping or wheezing  CV: no obvious cyanosis  MS: moves all visible extremities without noticeable abnormality  PSYCH/NEURO: pleasant and cooperative, no obvious depression or anxiety, speech and thought processing grossly intact  ASSESSMENT AND PLAN:  Discussed the following assessment and plan:  Nasal congestion  Cough  Close exposure to COVID-19 virus  -we discussed possible serious and likely etiologies, options for evaluation and workup, limitations of telemedicine visit vs in person visit, treatment, treatment risks and precautions. Pt is agreeable to treatment via telemedicine at this moment.  Query COVID-19 as most likely given the close exposure.  She was surprised to hear that it could be COVID since she tested negative.  Did let her know we are actually seeing a lot of false negative testing early on in the first day or 2.  Advised repeat testing and discussed proper testing if using home test versus in person testing options.  Also discussed the possibility of flu and options for testing for flu.  Advised of options for treatment if she gets a positive test.  Advise she could contact the [facility] pharmacies or do a virtual visit follow-up if she gets a positive COVID test within the first 5 days of symptoms and wishes to do an antiviral.  Sent Tessalon for cough and other symptomatic care measures were summarized in patient instructions. Advised low threshold to seek prompt in person care if  worsening, new symptoms arise, or if is not improving with treatment. Discussed options for inperson care if PCP office not available. Did let this patient know that I only do telemedicine on Tuesdays and Thursdays for [facility]. Advised to schedule follow up visit with PCP or [facility] if any further questions or concerns to avoid delays in care.   I discussed the assessment and treatment plan with the patient. The patient was provided an opportunity to ask questions and all were answered. The patient agreed with the plan and demonstrated an understanding of the instructions.     Lucretia Kern, DO

## 2021-03-22 ENCOUNTER — Telehealth: Payer: Self-pay | Admitting: *Deleted

## 2021-03-22 NOTE — Telephone Encounter (Signed)
Received call from stating she tested positive for Covid 19 today 03/22/21.  Pt experiencing fever of 101.0 which has been reduced due to tylenol, headache, body aches, and sinus drainage.  Per MD pt does not meet criteria for monoclonial treatment at this time.  Pt educated to continue taking oral temperature and tylenol q6 hours.  Pt educated to call office if symptoms become worse.  Pt verbalized understanding and appreciative of advice.

## 2021-03-27 ENCOUNTER — Other Ambulatory Visit: Payer: Self-pay | Admitting: Gastroenterology

## 2021-03-27 ENCOUNTER — Other Ambulatory Visit: Payer: Self-pay | Admitting: Family Medicine

## 2021-03-27 DIAGNOSIS — F4323 Adjustment disorder with mixed anxiety and depressed mood: Secondary | ICD-10-CM

## 2021-04-02 ENCOUNTER — Other Ambulatory Visit: Payer: Self-pay | Admitting: Adult Health

## 2021-04-02 ENCOUNTER — Other Ambulatory Visit: Payer: Self-pay

## 2021-04-02 ENCOUNTER — Ambulatory Visit
Admission: RE | Admit: 2021-04-02 | Discharge: 2021-04-02 | Disposition: A | Discharge status: Home / Self Care | Payer: BC Managed Care – PPO | Source: Ambulatory Visit | Attending: Adult Health | Admitting: Adult Health

## 2021-04-02 DIAGNOSIS — N6011 Diffuse cystic mastopathy of right breast: Secondary | ICD-10-CM | POA: Diagnosis not present

## 2021-04-02 DIAGNOSIS — C50911 Malignant neoplasm of unspecified site of right female breast: Secondary | ICD-10-CM

## 2021-04-02 DIAGNOSIS — R921 Mammographic calcification found on diagnostic imaging of breast: Secondary | ICD-10-CM | POA: Diagnosis not present

## 2021-04-03 ENCOUNTER — Encounter: Payer: Self-pay | Admitting: Family Medicine

## 2021-04-03 ENCOUNTER — Ambulatory Visit (INDEPENDENT_AMBULATORY_CARE_PROVIDER_SITE_OTHER): Payer: BC Managed Care – PPO | Admitting: Family Medicine

## 2021-04-03 VITALS — BP 134/78 | HR 75 | Temp 98.1°F | Ht 65.0 in | Wt 200.6 lb

## 2021-04-03 DIAGNOSIS — F17200 Nicotine dependence, unspecified, uncomplicated: Secondary | ICD-10-CM

## 2021-04-03 DIAGNOSIS — Z0000 Encounter for general adult medical examination without abnormal findings: Secondary | ICD-10-CM

## 2021-04-03 DIAGNOSIS — K219 Gastro-esophageal reflux disease without esophagitis: Secondary | ICD-10-CM

## 2021-04-03 DIAGNOSIS — I10 Essential (primary) hypertension: Secondary | ICD-10-CM | POA: Diagnosis not present

## 2021-04-03 DIAGNOSIS — K760 Fatty (change of) liver, not elsewhere classified: Secondary | ICD-10-CM

## 2021-04-03 DIAGNOSIS — I700 Atherosclerosis of aorta: Secondary | ICD-10-CM

## 2021-04-03 DIAGNOSIS — Z79899 Other long term (current) drug therapy: Secondary | ICD-10-CM

## 2021-04-03 DIAGNOSIS — E785 Hyperlipidemia, unspecified: Secondary | ICD-10-CM

## 2021-04-03 DIAGNOSIS — J439 Emphysema, unspecified: Secondary | ICD-10-CM | POA: Insufficient documentation

## 2021-04-03 DIAGNOSIS — Z23 Encounter for immunization: Secondary | ICD-10-CM

## 2021-04-03 DIAGNOSIS — F32A Depression, unspecified: Secondary | ICD-10-CM

## 2021-04-03 LAB — COMPREHENSIVE METABOLIC PANEL
ALT: 10 U/L (ref 0–35)
AST: 12 U/L (ref 0–37)
Albumin: 3.9 g/dL (ref 3.5–5.2)
Alkaline Phosphatase: 117 U/L (ref 39–117)
BUN: 7 mg/dL (ref 6–23)
CO2: 29 mEq/L (ref 19–32)
Calcium: 9.4 mg/dL (ref 8.4–10.5)
Chloride: 103 mEq/L (ref 96–112)
Creatinine, Ser: 0.57 mg/dL (ref 0.40–1.20)
GFR: 96.36 mL/min (ref >60.00)
Glucose, Bld: 99 mg/dL (ref 70–99)
Potassium: 4 mEq/L (ref 3.5–5.1)
Sodium: 141 mEq/L (ref 135–145)
Total Bilirubin: 0.7 mg/dL (ref 0.2–1.2)
Total Protein: 7.4 g/dL (ref 6.0–8.3)

## 2021-04-03 LAB — LIPID PANEL
Cholesterol: 157 mg/dL (ref 0–200)
HDL: 39.4 mg/dL (ref >39.00)
LDL Cholesterol: 89 mg/dL (ref 0–99)
NonHDL: 117.85
Total CHOL/HDL Ratio: 4
Triglycerides: 146 mg/dL (ref 0.0–149.0)
VLDL: 29.2 mg/dL (ref 0.0–40.0)

## 2021-04-03 LAB — POC URINALSYSI DIPSTICK (AUTOMATED)
Bilirubin, UA: POSITIVE
Blood, UA: NEGATIVE
Glucose, UA: NEGATIVE
Ketones, UA: NEGATIVE
Leukocytes, UA: NEGATIVE
Nitrite, UA: NEGATIVE
Protein, UA: NEGATIVE
Spec Grav, UA: 1.015 (ref 1.010–1.025)
Urobilinogen, UA: 0.2 E.U./dL
pH, UA: 7.5 (ref 5.0–8.0)

## 2021-04-03 LAB — CBC WITH DIFFERENTIAL/PLATELET
Basophils Absolute: 0.1 10*3/uL (ref 0.0–0.1)
Basophils Relative: 0.6 % (ref 0.0–3.0)
Eosinophils Absolute: 0.1 10*3/uL (ref 0.0–0.7)
Eosinophils Relative: 1.5 % (ref 0.0–5.0)
HCT: 40.9 % (ref 36.0–46.0)
Hemoglobin: 13.8 g/dL (ref 12.0–15.0)
Lymphocytes Relative: 13.1 % (ref 12.0–46.0)
Lymphs Abs: 1.1 10*3/uL (ref 0.7–4.0)
MCHC: 33.9 g/dL (ref 30.0–36.0)
MCV: 89.6 fl (ref 78.0–100.0)
Monocytes Absolute: 0.5 10*3/uL (ref 0.1–1.0)
Monocytes Relative: 6.3 % (ref 3.0–12.0)
Neutro Abs: 6.3 10*3/uL (ref 1.4–7.7)
Neutrophils Relative %: 78.5 % — ABNORMAL HIGH (ref 43.0–77.0)
Platelets: 209 10*3/uL (ref 150.0–400.0)
RBC: 4.56 Mil/uL (ref 3.87–5.11)
RDW: 14.3 % (ref 11.5–15.5)
WBC: 8 10*3/uL (ref 4.0–10.5)

## 2021-04-03 LAB — VITAMIN B12: Vitamin B-12: 422 pg/mL (ref 211–911)

## 2021-04-03 NOTE — Addendum Note (Signed)
Addended by: Loura Back on: 04/03/2021 05:17 PM   Modules accepted: Orders

## 2021-04-03 NOTE — Patient Instructions (Addendum)
Health Maintenance Due  Topic Date Due   Zoster Vaccines- Shingrix (1 of 2)  Patient plans to schedule a future visit with Dr. Yong Channel or a Nurse's visit to receive this vaccination.  Never done   COVID-19 Vaccine (3 - Pfizer risk series)  Recommend getting the Omicron variant specific booster only at you local pharmacy. Please let us know when you have received this.  01/29/2020   INFLUENZA VACCINE  Flu shot today before you leave. 02/12/2021   Sign release of information at the check out desk for PAP Smear  I recommend trying to increase the amount of time you exercise daily- at least 150 minutes per week. Remember to not over work yourself - listen to your body!  I also want to recommend that you try to make efforts in trying to eat a healthier diet.  I strongly want to encourage you to quit smoking in regards to your future long-term health. I really want for you to prioritize this.  Try Astelin in the morning daily for the next two weeks. You may also try medications such as Claritin or Allegra at night if needed. Please update with me if any improvement or new/worsening symptoms. Also, please message me through MyChart with the inhaler that you husband has on hand to see if it is an appropriate choice for you.  Recommended follow up: No follow-ups on file.

## 2021-04-03 NOTE — Progress Notes (Signed)
Phone [phone]   Subjective:  Patient presents today for their annual physical. Chief complaint-noted.   See problem oriented charting- Review of Systems  Constitutional:  Negative for chills, fever and weight loss.  HENT:  Positive for congestion. Negative for nosebleeds and sinus pain.   Eyes:  Negative for blurred vision and double vision.  Respiratory:  Positive for cough, shortness of breath and wheezing (rare mild).   Cardiovascular:  Negative for chest pain and palpitations.  Gastrointestinal:  Negative for heartburn, nausea and vomiting.  Genitourinary:  Positive for frequency. Negative for dysuria.  Musculoskeletal:  Negative for back pain, joint pain, myalgias and neck pain.  Skin:  Negative for itching and rash.  Neurological:  Negative for dizziness and headaches.  Endo/Heme/Allergies:  Negative for polydipsia. Does not bruise/bleed easily.  Psychiatric/Behavioral:  Negative for depression and suicidal ideas.    The following were reviewed and entered/updated in epic: Past Medical History:  Diagnosis Date   Abnormal glucose    Anxiety    05/05/20   Arthritis    Breast cancer ([place]) 03/2020   Esophageal stricture    Dr.Dora Olevia Perches   GERD (gastroesophageal reflux disease)    History of colonic polyps    Hyperlipidemia    Hypertension    MVP (mitral valve prolapse)    history of    Osteoporosis    Personal history of radiation therapy    Tobacco abuse    tried Chantix- caused agression   Patient Active Problem List   Diagnosis Date Noted   Emphysema lung ([place]) 04/03/2021    Priority: High   Bilateral breast cancer ([place]) 03/24/2020    Priority: High   TOBACCO ABUSE 09/30/2008    Priority: High   Fatty liver 04/03/2021    Priority: Medium   Hypertension 10/20/2017    Priority: Medium   History of squamous cell carcinoma of skin 09/12/2016    Priority: Medium   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 05/23/2016    Priority: Medium   Adjustment disorder  with mixed anxiety and depressed mood 08/17/2012    Priority: Medium   Hot flashes 03/23/2012    Priority: Medium   GERD 08/28/2009    Priority: Medium   Abnormal glucose 08/28/2009    Priority: Medium   PARESTHESIA 11/11/2008    Priority: Medium   Hyperlipemia 09/30/2008    Priority: Medium   Osteoporosis 09/30/2008    Priority: Medium   OSA (obstructive sleep apnea) 01/18/2013    Priority: Low   History of colonic polyps 11/11/2008    Priority: Low   Benign paroxysmal positional vertigo 10/25/2008    Priority: Low   Aortic atherosclerosis ([place]) 04/03/2021   Past Surgical History:  Procedure Laterality Date   APPENDECTOMY  2008   AXILLARY SENTINEL NODE BIOPSY Right 05/09/2020   Procedure: RIGHT AXILLARY SENTINEL NODE BIOPSY;  Surgeon: Alphonsa Overall, MD;  Location: [facility];  Service: General;  Laterality: Right;   BREAST BIOPSY  2000   BREAST BIOPSY Bilateral 2021   BREAST LUMPECTOMY WITH RADIOACTIVE SEED LOCALIZATION Right 05/09/2020   Procedure: RIGHT BREAST LUMPECTOMY WITH RADIOACTIVE SEED;  Surgeon: Alphonsa Overall, MD;  Location: [facility];  Service: General;  Laterality: Right;   COLONOSCOPY     ESOPHAGOGASTRODUODENOSCOPY     MASTECTOMY, PARTIAL Left 05/09/2020   Procedure: LEFT MASECTOMY;  Surgeon: Alphonsa Overall, MD;  Location: [facility];  Service: General;  Laterality: Left;   SENTINEL NODE BIOPSY Left 05/09/2020   Procedure: LEFT AXILLARY SENTINEL NODE BIOPSY;  Surgeon: Alphonsa Overall, MD;  Location: [facility];  Service: General;  Laterality: Left;   SKIN GRAFT Right    5h finger after amputation    vocal cord polyps removed     benigh, Dr Erik Obey    Family History  Problem Relation Age of Onset   Hyperlipidemia Other    Hiatal hernia Other        several family members   Hypertension Mother    Cancer Mother        apendix. right hemicolectomy   Other Mother        MGUS   Alcohol abuse Father    Cirrhosis Father        deceased form cirrhosis   Hypertension Father     Other Other        Fh of abnormal glucose   Hypertension Sister    Hypertension Brother    Colon cancer Neg Hx    Esophageal cancer Neg Hx    Rectal cancer Neg Hx     Medications- reviewed and updated Current Outpatient Medications  Medication Sig Dispense Refill   albuterol (VENTOLIN HFA) 108 (90 Base) MCG/ACT inhaler INHALE 2 PUFFS BY MOUTH EVERY 6 HOURS AS NEEDED (Patient taking differently: Inhale 2 puffs into the lungs every 6 (six) hours as needed for wheezing or shortness of breath.) 8.5 g 2   amLODipine (NORVASC) 2.5 MG tablet Take 1 tablet (2.5 mg total) by mouth daily. (Patient taking differently: Take 2.5 mg by mouth every morning.) 90 tablet 3   anastrozole (ARIMIDEX) 1 MG tablet Take 1 tablet (1 mg total) by mouth daily. 90 tablet 3   atorvastatin (LIPITOR) 40 MG tablet TAKE 1 TABLET BY MOUTH EVERY DAY 90 tablet 3   azelastine (ASTELIN) 0.1 % nasal spray Place 2 sprays into both nostrils 2 (two) times daily. (Patient taking differently: Place 2 sprays into both nostrils 2 (two) times daily as needed (congestion/allergies.).) 30 mL 12   citalopram (CELEXA) 20 MG tablet TAKE 1 TABLET BY MOUTH EVERY DAY 90 tablet 3   esomeprazole (NEXIUM) 40 MG capsule Take 1 capsule (40 mg total) by mouth in the morning and at bedtime. TAKE 1 CAPSULE BY MOUTH TWICE A DAY BEFORE A MEAL  >>>>>>CAN ONLY TAKE NEXIUM - BRAND NAME - NO GENERIC<<<<< 180 capsule 2   No current facility-administered medications for this visit.    Allergies-reviewed and updated Allergies  Allergen Reactions   Penicillins Other (See Comments)    whelps, ,skin hot to touch    Social History   Social History Narrative   Married over 30 years. Sons 29,35 in 2017. 2 grandkis- 1 grandaughter - [place] Haw . 1 grandson. Hudson lewis. All grandkids same son      Cared for mother before death in 0737, husband alcoholic- tremendous stress. Sister stays with mom- stressor      Occupation: office admin - [employer]     Objective  Objective:  BP 134/78   Pulse 75   Temp 98.1 F (36.7 C) (Temporal)   Ht 5\' 5"  (1.651 m)   Wt 200 lb 9.6 oz (91 kg)   SpO2 95%   BMI 33.38 kg/m  Gen: NAD, resting comfortably HEENT: Mucous membranes are moist. Oropharynx normal Neck: no thyromegaly CV: RRR no murmurs rubs or gallops Lungs: CTAB no crackles, wheeze, rhonchi Abdomen: soft/nontender/nondistended/normal bowel sounds. No rebound or guarding.  Ext: trace edema Skin: warm, dry Neuro: grossly normal, moves all extremities, PERRLA   Assessment and  Plan   65 y.o. female presenting for annual physical.  Health Maintenance counseling: 1. Anticipatory guidance: Patient counseled regarding regular dental exams -q6 months, eye exams - yearly,  avoiding smoking and second hand smoke -smokes at least a pack a day- see below , limiting alcohol to 1 beverage per day- doesn't drink , no illicit drugs.   2. Risk factor reduction:  Advised patient of need for regular exercise and diet rich and fruits and vegetables to reduce risk of heart attack and stroke. Exercise- treadmill 3-4 days a week for 15 minutes. Diet-down 1 lb from last visit here- not eating healthiest- discussed improving this.  Wt Readings from Last 3 Encounters:  04/03/21 200 lb 9.6 oz (91 kg)  03/20/21 204 lb (92.5 kg)  02/05/21 208 lb 2 oz (94.4 kg)  3. Immunizations/screenings/ancillary studies-discussed Shingrix-future visit with me or nurse visit, 80rd COVID-19 booster-discussed getting omicron specific variant booster at pharmacy- had covid recently and will wait 3 months, and Flu vaccination-today - otherwise up-to-date. Immunization History  Administered Date(s) Administered   Influenza,inj,Quad PF,6+ Mos 04/29/2013, 06/01/2015, 05/02/2016, 04/11/2019, 03/24/2020   PFIZER(Purple Top)SARS-COV-2 Vaccination 12/09/2019, 01/01/2020   Pneumococcal Polysaccharide-23 11/01/2012   Td 02/03/2007   Tdap 10/20/2017  4. Cervical cancer screening- last  02/17/20 with a 3-year repeat planned with [facility] for [facility]. Had one this year -will get records- was told normal 5. Breast cancer- bilateral breast cancer discovered March 15, 2020 patient later had right lumpectomy and left mastectomy with Dr. [name].  Patient later received radiation and is on ongoing anastrozole plan 10/02/2027 -did have another biopsy recently which came back benign on right breast- cyst only! Mammogram and [place] 03/12/21 led to this.  -Incidental lung nodules noted with repeat on July 2022- stable  6. Colon cancer screening - last 04/09/18 with a 3-year repeat planned- soonest she could get was next year 7. Skin cancer screening- appt in November for full check with November.  8. Birth control/STD check- postmenopausal and monogamous 9. Osteoporosis screening at 64- last 02/17/20 with a 2-year repeat planned.  Due to anastrozole is recommended to be on Reclast but she did not tolerate this.  She does not want to use Prolia-discuss further with next visit with Dr. Lindi Adie. This is on hold until finishes dental work- has not started anastrazole yet.  10. Smoking associated screening -  smoker 0.5 to  1 pack/day last visit-strongly encourage cessation -not a candidate for lung cancer screening but getting CTsat present has not cleared from a breast cancer.  We will get UA  Status of chronic or acute concerns   From CT 02/08/21 "1. Small area of soft tissue density in the LEFT axilla along the margin of the LEFT pectoralis muscle following LEFT mastectomy. This is likely postoperative changes related to axillary lymph node dissection or resection of axillary lymph node as it appears more bandlike on the coronal images. Attention on follow-up. 2. Stable appearance of small LEFT upper lobe pulmonary nodules. Attention on subsequent imaging. 3. Hepatic steatosis. 4. Emphysema and aortic atherosclerosis.  Aortic Atherosclerosis (ICD10-I70.0) and Emphysema (ICD10-J43.9)." -  discussed above diagnosis   #hypertension S: medication: amlodipine 2.5 mg in the AM daily Home readings #s: doesn't check BP Readings from Last 3 Encounters:  04/03/21 134/78  11/09/20 (!) 147/84  10/16/20 132/80  A/P: Controlled. Continue current medications.   #hyperlipidemia S: Medication:atorvastatin 40 mg daily  Lab Results  Component Value Date   CHOL 256 (H) 12/18/2017   HDL 38.70 (L)  12/18/2017   LDLCALC 139 (H) 01/13/2015   LDLDIRECT 190.0 12/18/2017   TRIG 238.0 (H) 12/18/2017   CHOLHDL 7 12/18/2017   A/P: hopefully improved- update lipid panel today. Continue current meds for now  # GERD S:Medication: esomeprazole 40 mg twice daily before meals - once a day  A/P: Controlled. Continue current medications.   - check b12 with long term use   # Depression S: Medication:Celexa 20 mg daily  Depression screen [facility] 2/9 04/03/2021 03/24/2020 10/13/2018  Decreased Interest 0 2 0  Down, Depressed, Hopeless 0 2 0  PHQ - 2 Score 0 4 0  Altered sleeping - 0 0  Tired, decreased energy - 0 0  Change in appetite - 0 0  Feeling bad or failure about yourself  - 0 0  Trouble concentrating - 0 0  Moving slowly or fidgety/restless - 0 0  Suicidal thoughts - 0 0  PHQ-9 Score - 4 0  Difficult doing work/chores - - Not difficult at all  Some recent data might be hidden  A/P: Rule remission with no anhedonia or depressed mood-continue current medication.  # sinus congestion S:gets really stopped up at night when she lays down and gets post nasal drip.  Astelin spray makes nose run- does at night.  A/P: We will try Astelin every morning for the next 2 weeks-if continues to have issues would recommend trying Claritin or Allegra before bed to see if this makes a difference.  #COPD/Emphysema- noted on CT- has albuterol available as needed- her husband has an inhaler that really helps her-she will reach out to me about the inhaler her husband is using and we will see if it is an  appropriate choice for her.  Recommended follow up: No follow-ups on file. Future Appointments  Date Time Provider [place]  05/07/2021  2:10 PM Suanne Marker, PTA [facility] None  05/11/2021  8:45 AM Nicholas Lose, MD [facility] None   Lab/Order associations:  fasting   ICD-10-CM   1. Preventative health care  Z00.00     2. Hypertension, unspecified type  I10 CBC with Differential/Platelet    Comprehensive metabolic panel    Lipid panel    3. Hyperlipidemia, unspecified hyperlipidemia type  E78.5 CBC with Differential/Platelet    Comprehensive metabolic panel    Lipid panel    4. Gastroesophageal reflux disease, unspecified whether esophagitis present  K21.9     5. Depression, unspecified depression type  F32.A     6. TOBACCO ABUSE  F17.200 POCT Urinalysis Dipstick (Automated)    7. Fatty liver  K76.0     8. Pulmonary emphysema, unspecified emphysema type ([place])  J43.9     9. Aortic atherosclerosis ([facility])  I70.0     10. High risk medication use  Z79.899 Vitamin B12      No orders of the defined types were placed in this encounter.  I,Harris Phan,acting as a Education administrator for [name], MD.,have documented all relevant documentation on the behalf of [name], MD,as directed by  [name], MD while in the presence of [name], MD.  I, [name], MD, have reviewed all documentation for this visit. The documentation on 04/03/21 for the exam, diagnosis, procedures, and orders are all accurate and complete.   Return precautions advised.  [name], MD

## 2021-05-07 ENCOUNTER — Ambulatory Visit: Payer: BC Managed Care – PPO

## 2021-05-09 NOTE — Progress Notes (Incomplete)
Patient Care Team: [name], MD as PCP - General (Family Medicine) Nicholas Lose, MD as Consulting Physician (Hematology and Oncology) Kyung Rudd, MD as Consulting Physician (Radiation Oncology) Alphonsa Overall, MD as Consulting Physician (General Surgery)  DIAGNOSIS: No diagnosis found.  SUMMARY OF ONCOLOGIC HISTORY: Oncology History  Bilateral breast cancer ([place])  03/15/2020 Initial Diagnosis   Right breast, 0.9cm right breast mass at the 12 o'clock position, and no axillary adenopathy. grade 1 invasive ductal carcinoma with high grade DCIS, HER-2 equivocal by IHC, negative by FISH, ER+ 100%, PR+ 100%, Ki67 2% T1BN0 stage Ia left breast: 2.5cm mass in the left breast at the 1 o'clock position with likely skin involvement in the nipple-areola complex, Invasive lobular carcinoma, grade 2, HER-2 negative (1+), ER+ 80%, PR+ 50%, Ki67 2%. T4N0 stage IIIb   05/09/2020 Surgery   Right lumpectomy and left mastectomy [name] [name]) [phone]): Right breast: invasive and in situ ductal carcinoma, 1.0cm, clear margins, 2 right axillary lymph nodes negative for carcinoma Left breast: invasive and in situ lobular carcinoma, 6.0cm, clear margins, 1/2 left axillary lymph nodes with isolated tumor cells.   05/09/2020 Cancer Staging   Staging form: Breast, AJCC 8th Edition - Pathologic stage from 05/09/2020: Stage IA (pT1b, pN0, cM0, G1, ER+, PR+, HER2-)   05/18/2020 Cancer Staging   Staging form: Breast, AJCC 8th Edition - Pathologic stage from 05/18/2020: Stage IIIA (pT4, pN0(mol+), cM0, G2, ER+, PR+, HER2-)   06/15/2020 Oncotype testing   Oncotype score: 2, distant recurrence at 9 years: 3%   07/03/2020 - 08/21/2020 Radiation Therapy   The patient initially received a dose of 50.40 Gy in 28 fractions to the RIGHT breast, LEFT chest wall, and LEFT supraclavicular area. using whole-breast tangent fields. This was delivered using a 3-D conformal technique. The pt received a boost delivering an  additional 10 Gy in 5 fractions to the LEFT chest wall and RIGHT breast using a electron boost with 23mV electrons.   08/2020 - 08/2027 Anti-estrogen oral therapy   Anastrozole     CHIEF COMPLIANT: Follow-up of bilateral breast cancer  INTERVAL HISTORY: [name] MUTZis a 65y.o. with above-mentioned history of bilateral breast cancer who underwent a right lumpectomy and left mastectomy and radiation. Mammogram on 03/12/2021 showed indeterminate calcifications lateral and inferior to the right lumpectomy site. She presents to the clinic today for follow-up.   ALLERGIES:  is allergic to penicillins.  MEDICATIONS:  Current Outpatient Medications  Medication Sig Dispense Refill   albuterol (VENTOLIN HFA) 108 (90 Base) MCG/ACT inhaler INHALE 2 PUFFS BY MOUTH EVERY 6 HOURS AS NEEDED (Patient taking differently: Inhale 2 puffs into the lungs every 6 (six) hours as needed for wheezing or shortness of breath.) 8.5 g 2   amLODipine (NORVASC) 2.5 MG tablet Take 1 tablet (2.5 mg total) by mouth daily. (Patient taking differently: Take 2.5 mg by mouth every morning.) 90 tablet 3   anastrozole (ARIMIDEX) 1 MG tablet Take 1 tablet (1 mg total) by mouth daily. 90 tablet 3   atorvastatin (LIPITOR) 40 MG tablet TAKE 1 TABLET BY MOUTH EVERY DAY 90 tablet 3   azelastine (ASTELIN) 0.1 % nasal spray Place 2 sprays into both nostrils 2 (two) times daily. (Patient taking differently: Place 2 sprays into both nostrils 2 (two) times daily as needed (congestion/allergies.).) 30 mL 12   citalopram (CELEXA) 20 MG tablet TAKE 1 TABLET BY MOUTH EVERY DAY 90 tablet 3   esomeprazole (NEXIUM) 40 MG capsule Take 1 capsule (40 mg  total) by mouth in the morning and at bedtime. TAKE 1 CAPSULE BY MOUTH TWICE A DAY BEFORE A MEAL  >>>>>>CAN ONLY TAKE NEXIUM - BRAND NAME - NO GENERIC<<<<< 180 capsule 2   No current facility-administered medications for this visit.    PHYSICAL EXAMINATION: ECOG PERFORMANCE STATUS: {CHL ONC ECOG  PS:[phone]}  There were no vitals filed for this visit. There were no vitals filed for this visit.  BREAST:*** No palpable masses or nodules in either right or left breasts. No palpable axillary supraclavicular or infraclavicular adenopathy no breast tenderness or nipple discharge. (exam performed in the presence of a chaperone)  LABORATORY DATA:  I have reviewed the data as listed CMP Latest Ref Rng & Units 04/03/2021 02/08/2021 05/05/2020  Glucose 70 - 99 mg/dL 99 - 112(H)  BUN 6 - 23 mg/dL 7 - 7(L)  Creatinine 0.40 - 1.20 mg/dL 0.57 0.60 0.59  Sodium 135 - 145 mEq/L 141 - 141  Potassium 3.5 - 5.1 mEq/L 4.0 - 3.7  Chloride 96 - 112 mEq/L 103 - 104  CO2 19 - 32 mEq/L 29 - 27  Calcium 8.4 - 10.5 mg/dL 9.4 - 9.5  Total Protein 6.0 - 8.3 g/dL 7.4 - -  Total Bilirubin 0.2 - 1.2 mg/dL 0.7 - -  Alkaline Phos 39 - 117 U/L 117 - -  AST 0 - 37 U/L 12 - -  ALT 0 - 35 U/L 10 - -    Lab Results  Component Value Date   WBC 8.0 04/03/2021   HGB 13.8 04/03/2021   HCT 40.9 04/03/2021   MCV 89.6 04/03/2021   PLT 209.0 04/03/2021   NEUTROABS 6.3 04/03/2021    ASSESSMENT & PLAN:  No problem-specific Assessment & Plan notes found for this encounter.    No orders of the defined types were placed in this encounter.  The patient has a good understanding of the overall plan. she agrees with it. she will call with any problems that may develop before the next visit here.  Total time spent: *** mins including face to face time and time spent for planning, charting and coordination of care  Rulon Eisenmenger, MD, MPH 05/09/2021  I, Thana Ates, am acting as scribe for Dr. Nicholas Lose.  {insert scribe attestation}

## 2021-05-11 ENCOUNTER — Inpatient Hospital Stay: Payer: BC Managed Care – PPO | Admitting: Hematology and Oncology

## 2021-05-11 NOTE — Assessment & Plan Note (Deleted)
Bilateral breast cancer ([name]) Right lumpectomy and left mastectomy [name] [name]): Right breast:invasive and in situ ductal carcinoma, 1.0cm, clear margins, 2 right axillary lymph nodes negative for carcinoma Grade 1,ER+ 100%, PR+ 100%, Ki67 2% T1BN0 stage Ia 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 grade 2, HER-2 negative (1+), ER+ 80%, PR+ 50%, Ki67 2%. T4N0 stage IIIA  Oncotype DX: 2, distant recurrence at 9 years: 3%  Treatment plan: 1. Adjuvant radiation therapy   07/03/2020-08/21/2020 2. Adjuvant antiestrogen therapy with anastrozole 1 mg daily x7 years  Anastrozole toxicities:  Breast cancer surveillance: 1.  Breast exam: 05/03/2021: Benign 2. mammogram right breast  Return to clinic in 1 year for follow-up

## 2021-05-22 NOTE — Progress Notes (Signed)
Patient Care Team: [name], MD as PCP - General (Family Medicine) Nicholas Lose, MD as Consulting Physician (Hematology and Oncology) Kyung Rudd, MD as Consulting Physician (Radiation Oncology) Alphonsa Overall, MD as Consulting Physician (General Surgery)  DIAGNOSIS:    ICD-10-CM   1. Bilateral malignant neoplasm of breast in female, unspecified estrogen receptor status, unspecified site of breast ([place])  C50.911    C50.912       SUMMARY OF ONCOLOGIC HISTORY: Oncology History  Bilateral breast cancer ([place])  03/15/2020 Initial Diagnosis   Right breast, 0.9cm right breast mass at the 12 o'clock position, and no axillary adenopathy. grade 1 invasive ductal carcinoma with high grade DCIS, HER-2 equivocal by IHC, negative by FISH, ER+ 100%, PR+ 100%, Ki67 2% T1BN0 stage Ia left breast: 2.5cm mass in the left breast at the 1 o'clock position with likely skin involvement in the nipple-areola complex, Invasive lobular carcinoma, grade 2, HER-2 negative (1+), ER+ 80%, PR+ 50%, Ki67 2%. T4N0 stage IIIb   05/09/2020 Surgery   Right lumpectomy and left mastectomy [name]) [phone]): Right breast: invasive and in situ ductal carcinoma, 1.0cm, clear margins, 2 right axillary lymph nodes negative for carcinoma Left breast: invasive and in situ lobular carcinoma, 6.0cm, clear margins, 1/2 left axillary lymph nodes with isolated tumor cells.   05/09/2020 Cancer Staging   Staging form: Breast, AJCC 8th Edition - Pathologic stage from 05/09/2020: Stage IA (pT1b, pN0, cM0, G1, ER+, PR+, HER2-)   05/18/2020 Cancer Staging   Staging form: Breast, AJCC 8th Edition - Pathologic stage from 05/18/2020: Stage IIIA (pT4, pN0(mol+), cM0, G2, ER+, PR+, HER2-)   06/15/2020 Oncotype testing   Oncotype score: 2, distant recurrence at 9 years: 3%   07/03/2020 - 08/21/2020 Radiation Therapy   The patient initially received a dose of 50.40 Gy in 28 fractions to the RIGHT breast, LEFT chest wall, and LEFT  supraclavicular area. using whole-breast tangent fields. This was delivered using a 3-D conformal technique. The pt received a boost delivering an additional 10 Gy in 5 fractions to the LEFT chest wall and RIGHT breast using a electron boost with 23mV electrons.   08/2020 - 08/2027 Anti-estrogen oral therapy   Anastrozole     CHIEF COMPLIANT: Follow-up to discuss antiestrogen therapy  INTERVAL HISTORY: MDENIZ ESKRIDGEis a 65y.o. with above-mentioned history of bilateral breast cancer who underwent a right lumpectomy and left mastectomy and radiation therapy, currently on antiestrogen therapy. Mammogram on 03/12/2021 showed indeterminate calcifications lateral and inferior to the right lumpectomy site in the right breast. Biopsy of the right breast on 04/02/2021 was negative for carcinoma. She presents to the clinic today for follow-up.  She has not started antiestrogen therapy because she thought antiestrogen therapy can cause dental issues worse.  She has major dental procedures coming up in the next few months.  I believe she got confused with bisphosphonate therapy.  ALLERGIES:  is allergic to penicillins.  MEDICATIONS:  Current Outpatient Medications  Medication Sig Dispense Refill   albuterol (VENTOLIN HFA) 108 (90 Base) MCG/ACT inhaler INHALE 2 PUFFS BY MOUTH EVERY 6 HOURS AS NEEDED (Patient taking differently: Inhale 2 puffs into the lungs every 6 (six) hours as needed for wheezing or shortness of breath.) 8.5 g 2   amLODipine (NORVASC) 2.5 MG tablet Take 1 tablet (2.5 mg total) by mouth daily. (Patient taking differently: Take 2.5 mg by mouth every morning.) 90 tablet 3   anastrozole (ARIMIDEX) 1 MG tablet Take 1 tablet (1 mg total)  by mouth daily. 90 tablet 3   atorvastatin (LIPITOR) 40 MG tablet TAKE 1 TABLET BY MOUTH EVERY DAY 90 tablet 3   azelastine (ASTELIN) 0.1 % nasal spray Place 2 sprays into both nostrils 2 (two) times daily. (Patient taking differently: Place 2 sprays into both  nostrils 2 (two) times daily as needed (congestion/allergies.).) 30 mL 12   citalopram (CELEXA) 20 MG tablet TAKE 1 TABLET BY MOUTH EVERY DAY 90 tablet 3   esomeprazole (NEXIUM) 40 MG capsule Take 1 capsule (40 mg total) by mouth in the morning and at bedtime. TAKE 1 CAPSULE BY MOUTH TWICE A DAY BEFORE A MEAL  >>>>>>CAN ONLY TAKE NEXIUM - BRAND NAME - NO GENERIC<<<<< 180 capsule 2   No current facility-administered medications for this visit.    PHYSICAL EXAMINATION: ECOG PERFORMANCE STATUS: 1 - Symptomatic but completely ambulatory  Vitals:   05/23/21 1124  BP: (!) 157/80  Pulse: 67  Resp: 18  Temp: (!) 97.5 F (36.4 C)  SpO2: 95%   Filed Weights   05/23/21 1124  Weight: 199 lb 3.2 oz (90.4 kg)      LABORATORY DATA:  I have reviewed the data as listed CMP Latest Ref Rng & Units 04/03/2021 02/08/2021 05/05/2020  Glucose 70 - 99 mg/dL 99 - 112(H)  BUN 6 - 23 mg/dL 7 - 7(L)  Creatinine 0.40 - 1.20 mg/dL 0.57 0.60 0.59  Sodium 135 - 145 mEq/L 141 - 141  Potassium 3.5 - 5.1 mEq/L 4.0 - 3.7  Chloride 96 - 112 mEq/L 103 - 104  CO2 19 - 32 mEq/L 29 - 27  Calcium 8.4 - 10.5 mg/dL 9.4 - 9.5  Total Protein 6.0 - 8.3 g/dL 7.4 - -  Total Bilirubin 0.2 - 1.2 mg/dL 0.7 - -  Alkaline Phos 39 - 117 U/L 117 - -  AST 0 - 37 U/L 12 - -  ALT 0 - 35 U/L 10 - -    Lab Results  Component Value Date   WBC 8.0 04/03/2021   HGB 13.8 04/03/2021   HCT 40.9 04/03/2021   MCV 89.6 04/03/2021   PLT 209.0 04/03/2021   NEUTROABS 6.3 04/03/2021    ASSESSMENT & PLAN:  Bilateral breast cancer ([place]) Right lumpectomy and left mastectomy [name]): 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 grade 2, HER-2 negative (1+),  ER+ 80%, PR+ 50%, Ki67 2%. T4N0 stage IIIA  CT CAP 04/06/2020: Stomach wall thickening?  Gastritis, small pulmonary nodules 4 mm and 3 mm, hepatomegaly with suspicion for portal gastropathy however no frank signs of cirrhosis.  No metastatic disease Bone scan 04/06/2020: No bone metastatic disease.    Oncotype DX: 2, distant recurrence at 9 years: 3%   Treatment plan: 1. Adjuvant radiation therapy   07/03/2020-08/21/2020 2. Adjuvant antiestrogen therapy with anastrozole 1 mg daily x7 years,    Anastrozole toxicities: Patient has not started anastrozole.  She will start it soon.  Telephone visit in 3 months to discuss tolerance to anastrozole therapy.    No orders of the defined types were placed in this encounter.  The patient has a good understanding of the overall plan. she agrees with it. she will call with any problems that may develop before the next visit  here.  Total time spent: 20 mins including face to face time and time spent for planning, charting and coordination of care  Rulon Eisenmenger, MD, MPH 05/23/2021  I, Thana Ates, am acting as scribe for Dr. Nicholas Lose.  I have reviewed the above documentation for accuracy and completeness, and I agree with the above.

## 2021-05-23 ENCOUNTER — Inpatient Hospital Stay: Payer: BC Managed Care – PPO | Attending: Adult Health | Admitting: Hematology and Oncology

## 2021-05-23 ENCOUNTER — Other Ambulatory Visit: Payer: Self-pay

## 2021-05-23 DIAGNOSIS — C50911 Malignant neoplasm of unspecified site of right female breast: Secondary | ICD-10-CM | POA: Diagnosis not present

## 2021-05-23 DIAGNOSIS — Z923 Personal history of irradiation: Secondary | ICD-10-CM | POA: Diagnosis not present

## 2021-05-23 DIAGNOSIS — Z9013 Acquired absence of bilateral breasts and nipples: Secondary | ICD-10-CM | POA: Insufficient documentation

## 2021-05-23 DIAGNOSIS — Z170 Estrogen receptor positive status [ER+]: Secondary | ICD-10-CM | POA: Insufficient documentation

## 2021-05-23 DIAGNOSIS — C50412 Malignant neoplasm of upper-outer quadrant of left female breast: Secondary | ICD-10-CM | POA: Diagnosis not present

## 2021-05-23 DIAGNOSIS — Z79811 Long term (current) use of aromatase inhibitors: Secondary | ICD-10-CM | POA: Diagnosis not present

## 2021-05-23 DIAGNOSIS — C50912 Malignant neoplasm of unspecified site of left female breast: Secondary | ICD-10-CM | POA: Diagnosis not present

## 2021-05-23 DIAGNOSIS — C50812 Malignant neoplasm of overlapping sites of left female breast: Secondary | ICD-10-CM | POA: Diagnosis not present

## 2021-05-23 DIAGNOSIS — C50811 Malignant neoplasm of overlapping sites of right female breast: Secondary | ICD-10-CM | POA: Diagnosis not present

## 2021-05-23 NOTE — Assessment & Plan Note (Signed)
Right lumpectomy and left mastectomy [name]): Right breast:invasive and in situ ductal carcinoma, 1.0cm, clear margins, 2 right axillary lymph nodes negative for carcinoma Grade 1,ER+ 100%, PR+ 100%, Ki67 2% T1BN0 stage Ia 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 grade 2, HER-2 negative (1+), ER+ 80%, PR+ 50%, Ki67 2%. T4N0 stage IIIA  CT CAP 04/06/2020: Stomach wall thickening? Gastritis, small pulmonary nodules 4 mm and 3 mm, hepatomegaly with suspicion for portal gastropathy however no frank signs of cirrhosis. No metastatic disease Bone scan 04/06/2020: No bone metastatic disease.   Oncotype DX: 2, distant recurrence at 9 years: 3%  Treatment plan: 1. Adjuvant radiation therapy   07/03/2020-08/21/2020 2. Adjuvant antiestrogen therapy with anastrozole 1 mg daily x7 years  Anastrozole toxicities:  Breast cancer surveillance: 1.  Breast exam 05/23/2021: Benign 2. mammogram

## 2021-05-24 ENCOUNTER — Telehealth: Payer: Self-pay | Admitting: Hematology and Oncology

## 2021-05-24 NOTE — Telephone Encounter (Signed)
Scheduled appointment per 11/10 los. Left message.

## 2021-06-11 DIAGNOSIS — L821 Other seborrheic keratosis: Secondary | ICD-10-CM | POA: Diagnosis not present

## 2021-06-11 DIAGNOSIS — L570 Actinic keratosis: Secondary | ICD-10-CM | POA: Diagnosis not present

## 2021-06-11 DIAGNOSIS — D225 Melanocytic nevi of trunk: Secondary | ICD-10-CM | POA: Diagnosis not present

## 2021-06-11 DIAGNOSIS — D235 Other benign neoplasm of skin of trunk: Secondary | ICD-10-CM | POA: Diagnosis not present

## 2021-06-11 DIAGNOSIS — Z85828 Personal history of other malignant neoplasm of skin: Secondary | ICD-10-CM | POA: Diagnosis not present

## 2021-06-14 ENCOUNTER — Encounter: Payer: Self-pay | Admitting: Gastroenterology

## 2021-07-03 ENCOUNTER — Telehealth: Payer: Self-pay | Admitting: Licensed Clinical Social Worker

## 2021-07-03 NOTE — Telephone Encounter (Signed)
CSW received VM from patient asking for a return call. CSW attempted to return call. No answer, left VM.   Christeen Douglas, LCSW

## 2021-08-16 ENCOUNTER — Other Ambulatory Visit: Payer: Self-pay | Admitting: Hematology and Oncology

## 2021-08-22 NOTE — Progress Notes (Signed)
HEMATOLOGY-ONCOLOGY TELEPHONE VISIT PROGRESS NOTE  I connected with [name] [name] [facility] on 08/23/2021 at 11:15 AM EST by telephone and verified that I am speaking with the correct person using two identifiers.  I discussed the limitations, risks, security and privacy concerns of performing an evaluation and management service by telephone and the availability of in person appointments.  I also discussed with the patient that there may be a patient responsible charge related to this service. The patient expressed understanding and agreed to proceed.   History of Present Illness: [name] [name] is a 66 y.o. female with above-mentioned history of bilateral breast cancer who underwent a right lumpectomy and left mastectomy and radiation therapy, currently on antiestrogen therapy. She presents via telephone today for follow-up. Tolerating anti estrogen therapy very well.   Oncology History  Bilateral breast cancer ([place])  03/15/2020 Initial Diagnosis   Right breast, 0.9cm right breast mass at the 12 o'clock position, and no axillary adenopathy. grade 1 invasive ductal carcinoma with high grade DCIS, HER-2 equivocal by IHC, negative by FISH, ER+ 100%, PR+ 100%, Ki67 2% T1BN0 stage Ia left breast: 2.5cm mass in the left breast at the 1 o'clock position with likely skin involvement in the nipple-areola complex, Invasive lobular carcinoma, grade 2, HER-2 negative (1+), ER+ 80%, PR+ 50%, Ki67 2%. T4N0 stage IIIb   05/09/2020 Surgery   Right lumpectomy and left mastectomy [name]) [phone]): Right breast: invasive and in situ ductal carcinoma, 1.0cm, clear margins, 2 right axillary lymph nodes negative for carcinoma Left breast: invasive and in situ lobular carcinoma, 6.0cm, clear margins, 1/2 left axillary lymph nodes with isolated tumor cells.   05/09/2020 Cancer Staging   Staging form: Breast, AJCC 8th Edition - Pathologic stage from 05/09/2020: Stage IA (pT1b, pN0, cM0, G1, ER+, PR+, HER2-)   05/18/2020  Cancer Staging   Staging form: Breast, AJCC 8th Edition - Pathologic stage from 05/18/2020: Stage IIIA (pT4, pN0(mol+), cM0, G2, ER+, PR+, HER2-)   06/15/2020 Oncotype testing   Oncotype score: 2, distant recurrence at 9 years: 3%   07/03/2020 - 08/21/2020 Radiation Therapy   The patient initially received a dose of 50.40 Gy in 28 fractions to the RIGHT breast, LEFT chest wall, and LEFT supraclavicular area. using whole-breast tangent fields. This was delivered using a 3-D conformal technique. The pt received a boost delivering an additional 10 Gy in 5 fractions to the LEFT chest wall and RIGHT breast using a electron boost with 44mV electrons.   08/2020 - 08/2027 Anti-estrogen oral therapy   Anastrozole     Observations/Objective:     Assessment Plan:  [place] lumpectomy and left mastectomy ([name]: 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 grade 2, HER-2 negative (1+), ER+ 80%, PR+ 50%, Ki67 2%. T4N0 stage IIIA   CT CAP 04/06/2020: Stomach wall thickening?  Gastritis, small pulmonary nodules 4 mm and 3 mm, hepatomegaly with suspicion for portal gastropathy however no frank signs of cirrhosis.  No metastatic disease Bone scan 04/06/2020: No bone metastatic disease.    Oncotype DX: 2, distant recurrence at 9 years: 3%   Treatment plan: 1. Adjuvant radiation therapy 07/03/2020-08/21/2020 2. Adjuvant antiestrogen therapy with anastrozole 1 mg daily x7 years,  started November 2022  Anastrozole toxicities: Denies hot flashes No joint stiffness Hair loss: encouraged her to use coconut oil Decreased sleep: encouraged to take it AM   Breast cancer surveillance: Mammograms  Return  to clinic in 1 year for follow-up    I discussed the assessment and treatment plan with the patient. The patient was provided an opportunity to ask questions and all were answered. The patient agreed with the plan and demonstrated an understanding of the instructions. The patient was advised to call back or seek an in-person evaluation if the symptoms worsen or if the condition fails to improve as anticipated.   Total time spent: 12 mins including non-face to face time and time spent for planning, charting and coordination of care  Rulon Eisenmenger, MD 08/23/2021    I, Thana Ates, am acting as scribe for Nicholas Lose, MD.  I have reviewed the above documentation for accuracy and completeness, and I agree with the above.

## 2021-08-23 ENCOUNTER — Inpatient Hospital Stay: Payer: BC Managed Care – PPO | Attending: Adult Health | Admitting: Hematology and Oncology

## 2021-08-23 DIAGNOSIS — C50912 Malignant neoplasm of unspecified site of left female breast: Secondary | ICD-10-CM | POA: Diagnosis not present

## 2021-08-23 DIAGNOSIS — C50911 Malignant neoplasm of unspecified site of right female breast: Secondary | ICD-10-CM | POA: Diagnosis not present

## 2021-08-23 NOTE — Assessment & Plan Note (Signed)
Right lumpectomy and left mastectomy [name]): Right breast:invasive and in situ ductal carcinoma, 1.0cm, clear margins, 2 right axillary lymph nodes negative for carcinoma Grade 1,ER+ 100%, PR+ 100%, Ki67 2% T1BN0 stage Ia 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 grade 2, HER-2 negative (1+), ER+ 80%, PR+ 50%, Ki67 2%. T4N0 stage IIIA  CT CAP 04/06/2020: Stomach wall thickening? Gastritis, small pulmonary nodules 4 mm and 3 mm, hepatomegaly with suspicion for portal gastropathy however no frank signs of cirrhosis. No metastatic disease Bone scan 04/06/2020: No bone metastatic disease.   Oncotype DX: 2, distant recurrence at 9 years: 3%  Treatment plan: 1. Adjuvant radiation therapy12/20/2021-08/21/2020 2. Adjuvant antiestrogen therapywith anastrozole 1 mg daily x7 years,  started November 2022  Anastrozole toxicities:  Breast cancer surveillance: Mammograms  Return to clinic in 1 year for follow-up

## 2021-08-24 ENCOUNTER — Telehealth: Payer: Self-pay | Admitting: Hematology and Oncology

## 2021-08-24 NOTE — Telephone Encounter (Signed)
Scheduled follow-up appointment per 2/9 los. Patient is aware. °

## 2021-08-28 ENCOUNTER — Encounter (HOSPITAL_COMMUNITY): Payer: Self-pay

## 2021-10-02 ENCOUNTER — Telehealth: Payer: Self-pay | Admitting: Family Medicine

## 2021-10-02 NOTE — Telephone Encounter (Signed)
Pt called in asking for department manager. I informed pt they were in a mtg and asked if I could help her. She stated she only wants to speak with the office manager and stated she will try to call tomorrow. I asked if we could call her back and she said no, she will call back tomorrow. ?

## 2021-11-14 ENCOUNTER — Encounter: Payer: Self-pay | Admitting: Gastroenterology

## 2021-11-23 ENCOUNTER — Other Ambulatory Visit: Payer: Self-pay | Admitting: Adult Health

## 2021-11-23 ENCOUNTER — Ambulatory Visit
Admission: RE | Admit: 2021-11-23 | Discharge: 2021-11-23 | Disposition: A | Discharge status: Home / Self Care | Payer: BC Managed Care – PPO | Source: Ambulatory Visit | Attending: Adult Health | Admitting: Adult Health

## 2021-11-23 ENCOUNTER — Ambulatory Visit
Admission: RE | Admit: 2021-11-23 | Discharge: 2021-11-23 | Disposition: A | Discharge status: Home / Self Care | Payer: BC Managed Care – PPO | Source: Ambulatory Visit | Attending: Hematology and Oncology | Admitting: Hematology and Oncology

## 2021-11-23 DIAGNOSIS — N6489 Other specified disorders of breast: Secondary | ICD-10-CM | POA: Diagnosis not present

## 2021-11-23 DIAGNOSIS — C50911 Malignant neoplasm of unspecified site of right female breast: Secondary | ICD-10-CM

## 2021-11-23 DIAGNOSIS — Z853 Personal history of malignant neoplasm of breast: Secondary | ICD-10-CM | POA: Diagnosis not present

## 2021-11-23 DIAGNOSIS — R922 Inconclusive mammogram: Secondary | ICD-10-CM | POA: Diagnosis not present

## 2021-11-26 ENCOUNTER — Other Ambulatory Visit: Payer: Self-pay | Admitting: Family Medicine

## 2021-12-15 ENCOUNTER — Other Ambulatory Visit (HOSPITAL_COMMUNITY): Payer: Self-pay

## 2021-12-15 ENCOUNTER — Telehealth: Payer: Self-pay | Admitting: Pharmacy Technician

## 2021-12-15 NOTE — Telephone Encounter (Signed)
Patient Advocate Encounter  Received notification from [employer] that RENEWAL prior authorization for [place] '40MG'$  [facility]) is required.   PA submitted on 6.3.23, BUT UNABLE TO PROCESS DUE TO CURRENT PA STILL ACTIVE UNTIL 7.1.23 Key [id number] Status is pending   [facility] will continue to follow  Luciano Cutter, CPhT Patient Advocate Phone: [phone]

## 2022-01-08 ENCOUNTER — Encounter: Payer: Self-pay | Admitting: Gastroenterology

## 2022-01-08 ENCOUNTER — Ambulatory Visit (AMBULATORY_SURGERY_CENTER): Payer: BC Managed Care – PPO | Admitting: *Deleted

## 2022-01-08 VITALS — Ht 65.0 in | Wt 200.0 lb

## 2022-01-08 DIAGNOSIS — Z86010 Personal history of colonic polyps: Secondary | ICD-10-CM

## 2022-01-08 MED ORDER — NA SULFATE-K SULFATE-MG SULF 17.5-3.13-1.6 GM/177ML PO SOLN: 1.0000 | Freq: Once | ORAL | 0 refills | Status: AC

## 2022-01-22 ENCOUNTER — Ambulatory Visit (AMBULATORY_SURGERY_CENTER): Payer: BC Managed Care – PPO | Admitting: Gastroenterology

## 2022-01-22 ENCOUNTER — Encounter: Payer: Self-pay | Admitting: Gastroenterology

## 2022-01-22 VITALS — BP 124/65 | HR 68 | Temp 97.8°F | Resp 24 | Ht 65.0 in | Wt 200.0 lb

## 2022-01-22 DIAGNOSIS — Z1211 Encounter for screening for malignant neoplasm of colon: Secondary | ICD-10-CM | POA: Diagnosis not present

## 2022-01-22 DIAGNOSIS — K635 Polyp of colon: Secondary | ICD-10-CM

## 2022-01-22 DIAGNOSIS — Z86010 Personal history of colonic polyps: Secondary | ICD-10-CM

## 2022-01-22 DIAGNOSIS — D122 Benign neoplasm of ascending colon: Secondary | ICD-10-CM

## 2022-01-22 DIAGNOSIS — D123 Benign neoplasm of transverse colon: Secondary | ICD-10-CM

## 2022-01-22 DIAGNOSIS — Z09 Encounter for follow-up examination after completed treatment for conditions other than malignant neoplasm: Secondary | ICD-10-CM | POA: Diagnosis not present

## 2022-01-22 MED ORDER — SODIUM CHLORIDE 0.9 % IV SOLN: 500.0000 mL | Freq: Once | INTRAVENOUS | Status: DC

## 2022-01-22 NOTE — Patient Instructions (Signed)
Await pathology results.  Handout on polyps, diverticulosis, and hemorrhoids given.  YOU HAD AN ENDOSCOPIC PROCEDURE TODAY AT THE [facility]:   Refer to the procedure report that was given to you for any specific questions about what was found during the examination.  If the procedure report does not answer your questions, please call your gastroenterologist to clarify.  If you requested that your care partner not be given the details of your procedure findings, then the procedure report has been included in a sealed envelope for you to review at your convenience later.  YOU SHOULD EXPECT: Some feelings of bloating in the abdomen. Passage of more gas than usual.  Walking can help get rid of the air that was put into your GI tract during the procedure and reduce the bloating. If you had a lower endoscopy (such as a colonoscopy or flexible sigmoidoscopy) you may notice spotting of blood in your stool or on the toilet paper. If you underwent a bowel prep for your procedure, you may not have a normal bowel movement for a few days.  Please Note:  You might notice some irritation and congestion in your nose or some drainage.  This is from the oxygen used during your procedure.  There is no need for concern and it should clear up in a day or so.  SYMPTOMS TO REPORT IMMEDIATELY:  Following lower endoscopy (colonoscopy or flexible sigmoidoscopy):  Excessive amounts of blood in the stool  Significant tenderness or worsening of abdominal pains  Swelling of the abdomen that is new, acute  Fever of 100F or higher    For urgent or emergent issues, a gastroenterologist can be reached at any hour by calling [phone]. Do not use MyChart messaging for urgent concerns.    DIET:  We do recommend a small meal at first, but then you may proceed to your regular diet.  Drink plenty of fluids but you should avoid alcoholic beverages for 24 hours.  ACTIVITY:  You should plan to take it easy for  the rest of today and you should NOT DRIVE or use heavy machinery until tomorrow (because of the sedation medicines used during the test).    FOLLOW UP: Our staff will call the number listed on your records the next business day following your procedure.  We will call around 7:15- 8:00 am to check on you and address any questions or concerns that you may have regarding the information given to you following your procedure. If we do not reach you, we will leave a message.  If you develop any symptoms (ie: fever, flu-like symptoms, shortness of breath, cough etc.) before then, please call [phone].  If you test positive for Covid 19 in the 2 weeks post procedure, please call and report this information to us.    If any biopsies were taken you will be contacted by phone or by letter within the next 1-3 weeks.  Please call us at [phone] if you have not heard about the biopsies in 3 weeks.    SIGNATURES/CONFIDENTIALITY: You and/or your care partner have signed paperwork which will be entered into your electronic medical record.  These signatures attest to the fact that that the information above on your After Visit Summary has been reviewed and is understood.  Full responsibility of the confidentiality of this discharge information lies with you and/or your care-partner.  

## 2022-01-22 NOTE — Progress Notes (Signed)
Pt's states no medical or surgical changes since previsit or office visit. 

## 2022-01-22 NOTE — Op Note (Signed)
[place] Patient Name: [name] [name] Procedure Date: 01/22/2022 8:33 AM MRN: [id number] Endoscopist: Mauri Pole , MD Age: 66 Referring MD:  Date of Birth: 08/24/1955 Gender: Female Account #: [id number] Procedure:                Colonoscopy Indications:              Screening in patient at increased risk: Family                            history of 1st-degree relative with colorectal                            cancer, High risk colon cancer surveillance:                            Personal history of colonic polyps, High risk colon                            cancer surveillance: Personal history of adenoma                            (10 mm or greater in size), High risk colon cancer                            surveillance: Personal history of multiple (3 or                            more) adenomas Medicines:                Monitored Anesthesia Care Procedure:                Pre-Anesthesia Assessment:                           - Prior to the procedure, a History and Physical                            was performed, and patient medications and                            allergies were reviewed. [name] tolerance of                            previous anesthesia was also reviewed. The risks                            and benefits of the procedure and the sedation                            options and risks were discussed with the patient.                            All questions were answered, and informed consent  was obtained. Prior Anticoagulants: The patient has                            taken no previous anticoagulant or antiplatelet                            agents. ASA Grade Assessment: III - A patient with                            severe systemic disease. After reviewing the risks                            and benefits, the patient was deemed in                            satisfactory condition to undergo the  procedure.                           After obtaining informed consent, the colonoscope                            was passed under direct vision. Throughout the                            procedure, the patient's blood pressure, pulse, and                            oxygen saturations were monitored continuously. The                            [id number]-HQ190L Colonoscope was introduced through the                            anus and advanced to the the cecum, identified by                            appendiceal orifice and ileocecal valve. The                            colonoscopy was performed without difficulty. The                            patient tolerated the procedure well. The quality                            of the bowel preparation was good. The ileocecal                            valve, appendiceal orifice, and rectum were                            photographed. Scope In: 8:38:33 AM Scope Out: 8:57:33 AM Scope Withdrawal Time: 0 hours 14 minutes 58 seconds  Total Procedure Duration: 0 hours 19  minutes 0 seconds  Findings:                 The perianal and digital rectal examinations were                            normal.                           Four sessile polyps were found in the transverse                            colon and ascending colon. The polyps were 5 to 12                            mm in size. These polyps were removed with a cold                            snare. Resection and retrieval were complete.                           A single small angioectasia without bleeding was                            found at the ileocecal valve. No biopsies or other                            specimens were collected for this exam.                           Scattered small-mouthed diverticula were found in                            the sigmoid colon.                           Non-bleeding external and internal hemorrhoids were                            found during  retroflexion. The hemorrhoids were                            medium-sized. Complications:            No immediate complications. Estimated Blood Loss:     Estimated blood loss was minimal. Impression:               - Four 5 to 12 mm polyps in the transverse colon                            and in the ascending colon, removed with a cold                            snare. Resected and retrieved.                           -  A single non-bleeding colonic angioectasia. No                            specimens collected.                           - Diverticulosis in the sigmoid colon.                           - Non-bleeding external and internal hemorrhoids. Recommendation:           - Patient has a contact number available for                            emergencies. The signs and symptoms of potential                            delayed complications were discussed with the                            patient. Return to normal activities tomorrow.                            Written discharge instructions were provided to the                            patient.                           - Resume previous diet.                           - Continue present medications.                           - Await pathology results.                           - Repeat colonoscopy in 3 - 5 years for                            surveillance based on pathology results. Mauri Pole, MD 01/22/2022 9:02:12 AM This report has been signed electronically.

## 2022-01-22 NOTE — Progress Notes (Signed)
[facility] History and Physical   Primary Care Physician:  [name], MD   Reason for Procedure:  History of adenomatous colon polyps and family h/o colon cancer  Plan:    Surveillance colonoscopy with possible interventions as needed     HPI: [name] [name] is a very pleasant 66 y.o. female here for surveillance colonoscopy. Denies any nausea, vomiting, abdominal pain, melena or bright red blood per rectum  The risks and benefits as well as alternatives of endoscopic procedure(s) have been discussed and reviewed. All questions answered. The patient agrees to proceed.    Past Medical History:  Diagnosis Date   Abnormal glucose    Allergy    Anxiety    05/05/20   Arthritis    Breast cancer ([place]) 03/2020   COPD (chronic obstructive pulmonary disease) (HCC)    Esophageal stricture    Dr.Dora Olevia Perches   GERD (gastroesophageal reflux disease)    History of colonic polyps    Hyperlipidemia    Hypertension    MVP (mitral valve prolapse)    history of    Osteoporosis    Personal history of radiation therapy    completed 08-21-2020 done bilaterally  no chemo   Tobacco abuse    tried Chantix- caused agression    Past Surgical History:  Procedure Laterality Date   APPENDECTOMY  2008   AXILLARY SENTINEL NODE BIOPSY Right 05/09/2020   Procedure: RIGHT AXILLARY SENTINEL NODE BIOPSY;  Surgeon: Alphonsa Overall, MD;  Location: [place];  Service: General;  Laterality: Right;   BREAST BIOPSY  2000   BREAST BIOPSY Bilateral 2021   BREAST LUMPECTOMY WITH RADIOACTIVE SEED LOCALIZATION Right 05/09/2020   Procedure: RIGHT BREAST LUMPECTOMY WITH RADIOACTIVE SEED;  Surgeon: Alphonsa Overall, MD;  Location: [place];  Service: General;  Laterality: Right;   COLONOSCOPY     ESOPHAGOGASTRODUODENOSCOPY     MASTECTOMY, PARTIAL Left 05/09/2020   Procedure: LEFT MASECTOMY;  Surgeon: Alphonsa Overall, MD;  Location: [place];  Service: General;  Laterality: Left;   MOHS SURGERY     nose    SENTINEL NODE BIOPSY Left 05/09/2020   Procedure: LEFT AXILLARY SENTINEL NODE BIOPSY;  Surgeon: Alphonsa Overall, MD;  Location: [place];  Service: General;  Laterality: Left;   SKIN GRAFT Right    5h finger after amputation    vocal cord polyps removed     benigh, Dr Erik Obey    Prior to Admission medications   Medication Sig Start Date End Date Taking? Authorizing Provider  amLODipine (NORVASC) 2.5 MG tablet TAKE 1 TABLET BY MOUTH EVERY DAY 11/26/21  Yes [name], MD  anastrozole (ARIMIDEX) 1 MG tablet TAKE 1 TABLET BY MOUTH EVERY DAY 08/16/21  Yes Nicholas Lose, MD  atorvastatin (LIPITOR) 40 MG tablet TAKE 1 TABLET BY MOUTH EVERY DAY 03/27/21  Yes [name], MD  citalopram (CELEXA) 20 MG tablet TAKE 1 TABLET BY MOUTH EVERY DAY 03/27/21  Yes [name], MD  esomeprazole (NEXIUM) 40 MG capsule Take 1 capsule (40 mg total) by mouth in the morning and at bedtime. TAKE 1 CAPSULE BY MOUTH TWICE A DAY BEFORE A MEAL  >>>>>>CAN ONLY TAKE NEXIUM - BRAND NAME - NO GENERIC<<<<< 01/08/21  Yes Micaiah Remillard, Venia Minks, MD  albuterol (VENTOLIN HFA) 108 (90 Base) MCG/ACT inhaler INHALE 2 PUFFS BY MOUTH EVERY 6 HOURS AS NEEDED Patient taking differently: Inhale 2 puffs into the lungs every 6 (six) hours as needed for wheezing or shortness of breath. 03/13/20  [name], MD  azelastine (ASTELIN) 0.1 % nasal spray Place 2 sprays into both nostrils 2 (two) times daily. Patient taking differently: Place 2 sprays into both nostrils 2 (two) times daily as needed (congestion/allergies.). 03/24/20   [name], MD  promethazine (PHENERGAN) 25 MG tablet Take 1 tablet (25 mg total) by mouth every 6 (six) hours as needed for nausea or vomiting. 09/28/13 09/28/13  Lacretia Leigh, MD    Current Outpatient Medications  Medication Sig Dispense Refill   amLODipine (NORVASC) 2.5 MG tablet TAKE 1 TABLET BY MOUTH EVERY DAY 90 tablet 3   anastrozole (ARIMIDEX) 1 MG tablet TAKE 1 TABLET BY MOUTH EVERY DAY  90 tablet 3   atorvastatin (LIPITOR) 40 MG tablet TAKE 1 TABLET BY MOUTH EVERY DAY 90 tablet 3   citalopram (CELEXA) 20 MG tablet TAKE 1 TABLET BY MOUTH EVERY DAY 90 tablet 3   esomeprazole (NEXIUM) 40 MG capsule Take 1 capsule (40 mg total) by mouth in the morning and at bedtime. TAKE 1 CAPSULE BY MOUTH TWICE A DAY BEFORE A MEAL  >>>>>>CAN ONLY TAKE NEXIUM - BRAND NAME - NO GENERIC<<<<< 180 capsule 2   albuterol (VENTOLIN HFA) 108 (90 Base) MCG/ACT inhaler INHALE 2 PUFFS BY MOUTH EVERY 6 HOURS AS NEEDED (Patient taking differently: Inhale 2 puffs into the lungs every 6 (six) hours as needed for wheezing or shortness of breath.) 8.5 g 2   azelastine (ASTELIN) 0.1 % nasal spray Place 2 sprays into both nostrils 2 (two) times daily. (Patient taking differently: Place 2 sprays into both nostrils 2 (two) times daily as needed (congestion/allergies.).) 30 mL 12   Current Facility-Administered Medications  Medication Dose Route Frequency Provider Last Rate Last Admin   0.9 %  sodium chloride infusion  500 mL Intravenous Once Mauri Pole, MD        Allergies as of 01/22/2022 - Review Complete 01/22/2022  Allergen Reaction Noted   Penicillins Other (See Comments) 08/01/2008    Family History  Problem Relation Age of Onset   Hypertension Mother    Cancer Mother        apendix. right hemicolectomy preventative surgery   Other Mother        MGUS   Alcohol abuse Father    Cirrhosis Father        deceased form cirrhosis   Hypertension Father    Hypertension Sister    Hypertension Brother    Hyperlipidemia Other    Hiatal hernia Other        several family members   Other Other        Fh of abnormal glucose   Colon cancer Neg Hx    Esophageal cancer Neg Hx    Rectal cancer Neg Hx    Colon polyps Neg Hx    Stomach cancer Neg Hx     Social History   Socioeconomic History   Marital status: Married    Spouse name: Not on file   Number of children: 2   Years of education: Not  on file   Highest education level: Not on file  Occupational History   Occupation: administrative assitant   Tobacco Use   Smoking status: Every Day    Packs/day: 1.00    Years: 40.00    Total pack years: 40.00    Types: Cigarettes    Start date: 07/15/1978   Smokeless tobacco: Never  Vaping Use   Vaping Use: Never used  Substance and Sexual Activity  Alcohol use: No    Alcohol/week: 0.0 standard drinks of alcohol   Drug use: No   Sexual activity: Not Currently  Other Topics Concern   Not on file  Social History Narrative   Married over 30 years. Sons 29,35 in 2017. 2 grandkis- 1 grandaughter - [place] Haw . 1 grandson. [place] lewis. All grandkids same son      Cared for mother before death in 2518, husband alcoholic- tremendous stress. Sister stays with mom- stressor      Occupation: office [employer]    Social Determinants of Health   Financial Resource Strain: Not on file  Food Insecurity: Not on file  Transportation Needs: Not on file  Physical Activity: Not on file  Stress: Not on file  Social Connections: Not on file  Intimate Partner Violence: Not on file    Review of Systems:  All other review of systems negative except as mentioned in the HPI.  Physical Exam: Vital signs in last 24 hours: BP 111/64   Pulse 82   Temp 97.8 F (36.6 C) (Temporal)   Ht '5\' 5"'$  (1.651 m)   Wt 200 lb (90.7 kg)   SpO2 92%   BMI 33.28 kg/m  General:   Alert, NAD Lungs:  Clear .   Heart:  Regular rate and rhythm Abdomen:  Soft, nontender and nondistended. Neuro/Psych:  Alert and cooperative. Normal mood and affect. A and O x 3  Reviewed labs, radiology imaging, old records and pertinent past GI work up  Patient is appropriate for planned procedure(s) and anesthesia in an ambulatory setting   K. Denzil Magnuson , MD [phone]

## 2022-01-22 NOTE — Progress Notes (Signed)
Called to room to assist during endoscopic procedure.  Patient ID and intended procedure confirmed with present staff. Received instructions for my participation in the procedure from the performing physician.  

## 2022-01-22 NOTE — Progress Notes (Signed)
Report to PACU, RN, vss, BBS= Clear.  

## 2022-01-23 ENCOUNTER — Telehealth: Payer: Self-pay | Admitting: *Deleted

## 2022-01-23 NOTE — Telephone Encounter (Signed)
  Follow up Call-     01/22/2022    7:29 AM  Call back number  Post procedure Call Back phone  # [phone]  Permission to leave phone message Yes     Patient questions:  Do you have a fever, pain , or abdominal swelling? No. Pain Score  0 *  Have you tolerated food without any problems? Yes.    Have you been able to return to your normal activities? Yes.    Do you have any questions about your discharge instructions: Diet   No. Medications  No. Follow up visit  No.  Do you have questions or concerns about your Care? No.  Actions: * If pain score is 4 or above: No action needed, pain <4.

## 2022-01-30 ENCOUNTER — Encounter: Payer: Self-pay | Admitting: Gastroenterology

## 2022-02-21 ENCOUNTER — Other Ambulatory Visit: Payer: Self-pay | Admitting: Gastroenterology

## 2022-02-23 ENCOUNTER — Other Ambulatory Visit: Payer: Self-pay | Admitting: Gastroenterology

## 2022-02-24 ENCOUNTER — Other Ambulatory Visit: Payer: Self-pay | Admitting: Gastroenterology

## 2022-02-27 ENCOUNTER — Telehealth: Payer: Self-pay | Admitting: Pharmacy Technician

## 2022-02-27 ENCOUNTER — Other Ambulatory Visit (HOSPITAL_COMMUNITY): Payer: Self-pay

## 2022-02-27 NOTE — Telephone Encounter (Signed)
Patient Advocate Encounter  Prior Authorization for [place] '40MG'$  has been approved.    PA# [id number] Effective dates: 7.17.23 through 8.15.24  Doris Gruhn B. CPhT P: [phone] F: [phone]   Received notification from [place] that prior authorization for [place] '40MG'$  is required.   PA submitted on 8.16.23 Key [id number] Status is pending    Luciano Cutter, CPhT Patient Advocate Phone: [phone]

## 2022-03-03 ENCOUNTER — Other Ambulatory Visit: Payer: Self-pay | Admitting: Gastroenterology

## 2022-03-19 IMAGING — MG DIGITAL DIAGNOSTIC BILAT W/ TOMO W/ CAD
8 series · 8 of 24 positions shown · non-contrast
Comparison: Previous exam(s).

CLINICAL DATA: Recall from screening mammography with
tomosynthesis, possible mass involving the UPPER INNER QUADRANT of
the RIGHT breast and possible mass and architectural distortion
involving UPPER LEFT breast.

EXAM:
DIGITAL DIAGNOSTIC BILATERAL MAMMOGRAM WITH TOMO
LIMITED ULTRASOUND BILATERAL BREASTS

[L CC synth-2D]
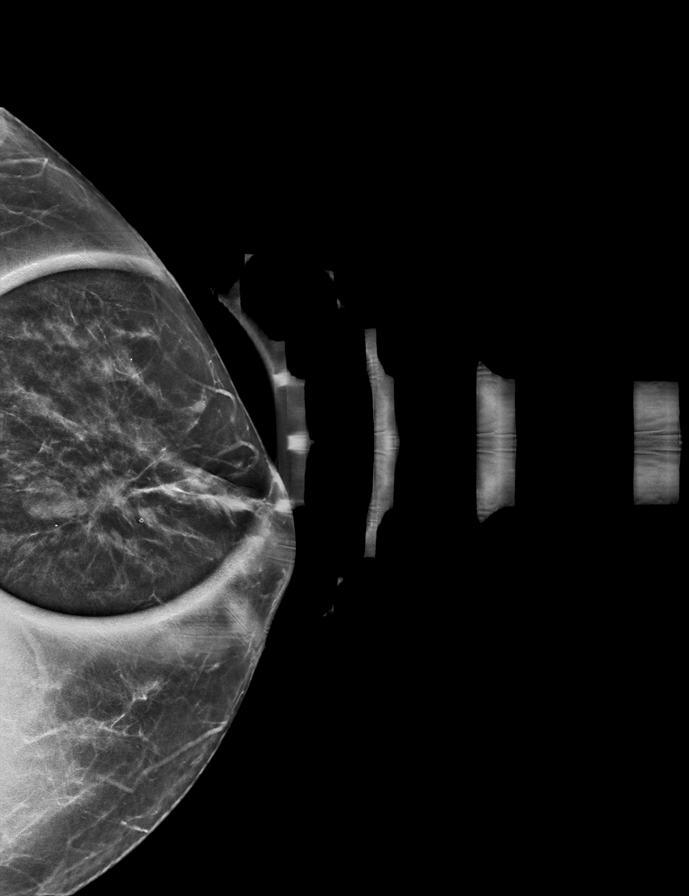

[R CC synth-2D]
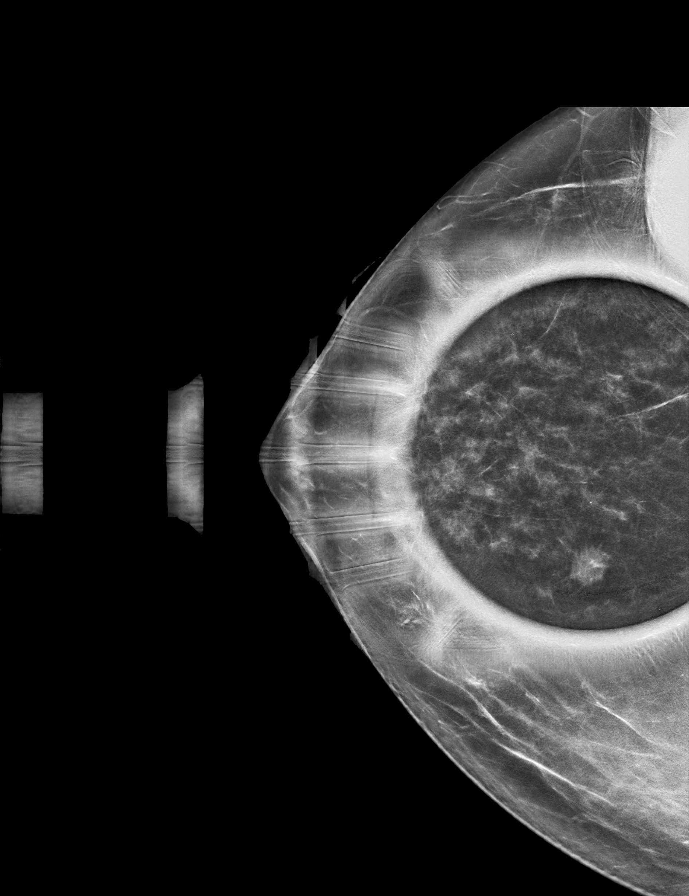

[L MLO synth-2D]
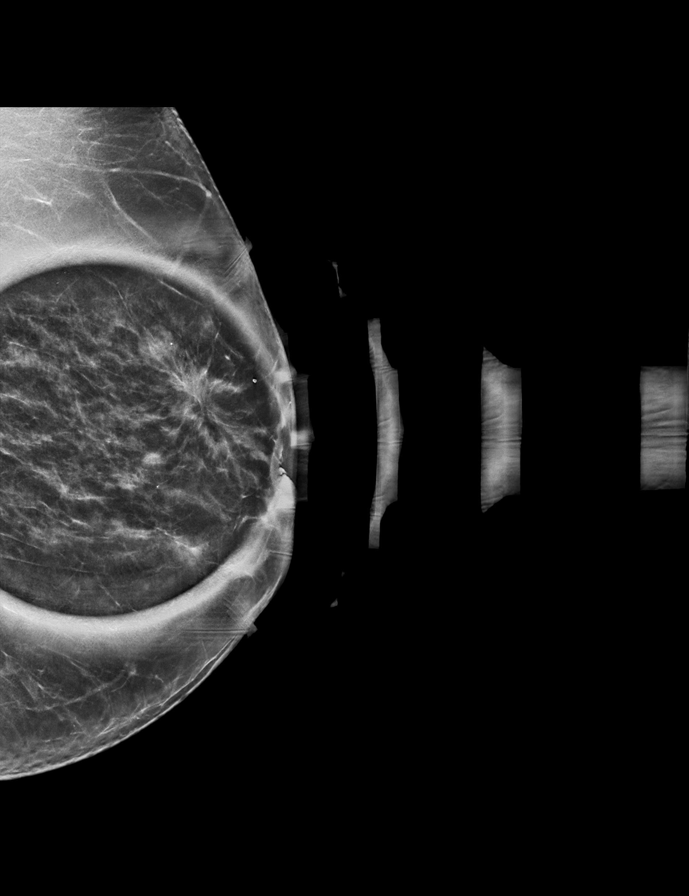

[R MLO synth-2D]
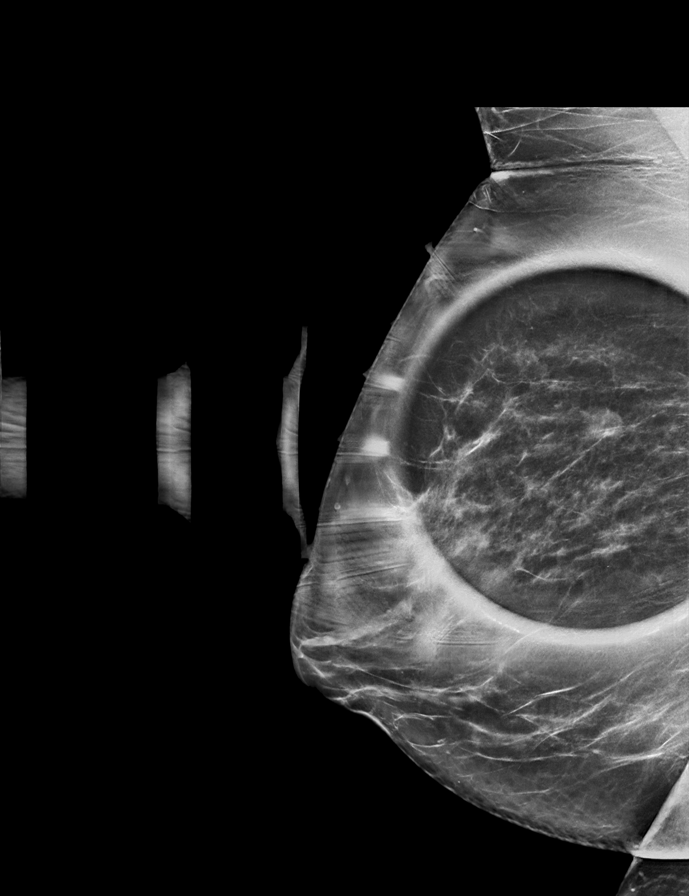

[R MLO tomo · tomo slice 37/74.0]
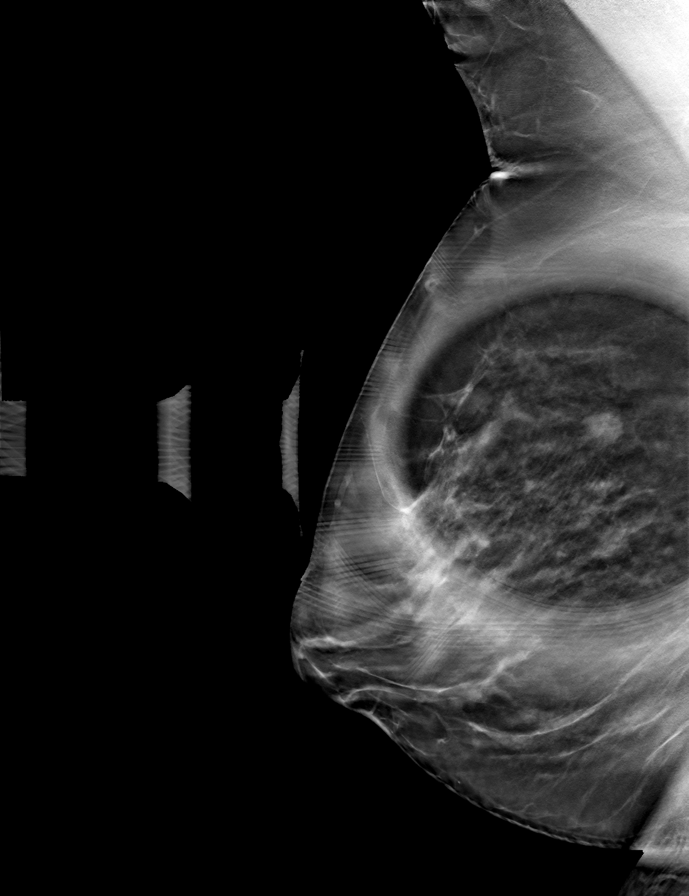

[L CC tomo · tomo slice 27/54.0]
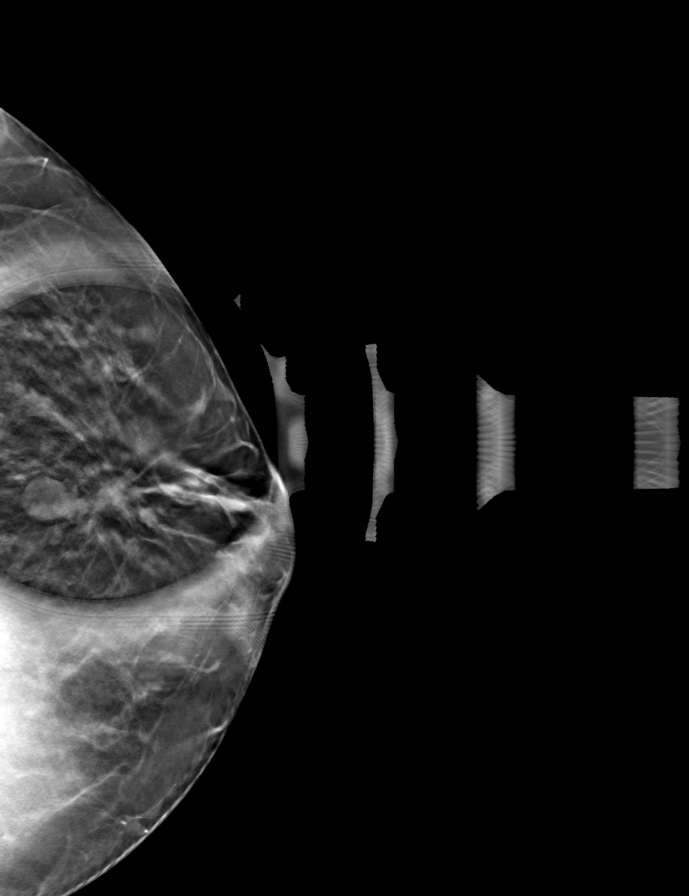

[L MLO tomo · tomo slice 31/60.0]
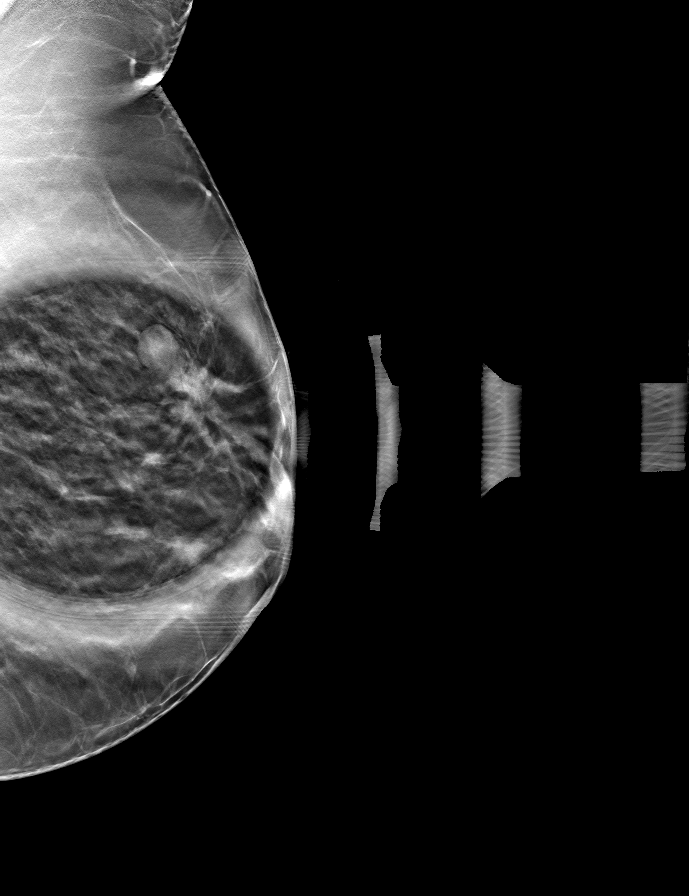

[R CC tomo · tomo slice 31/62.0]
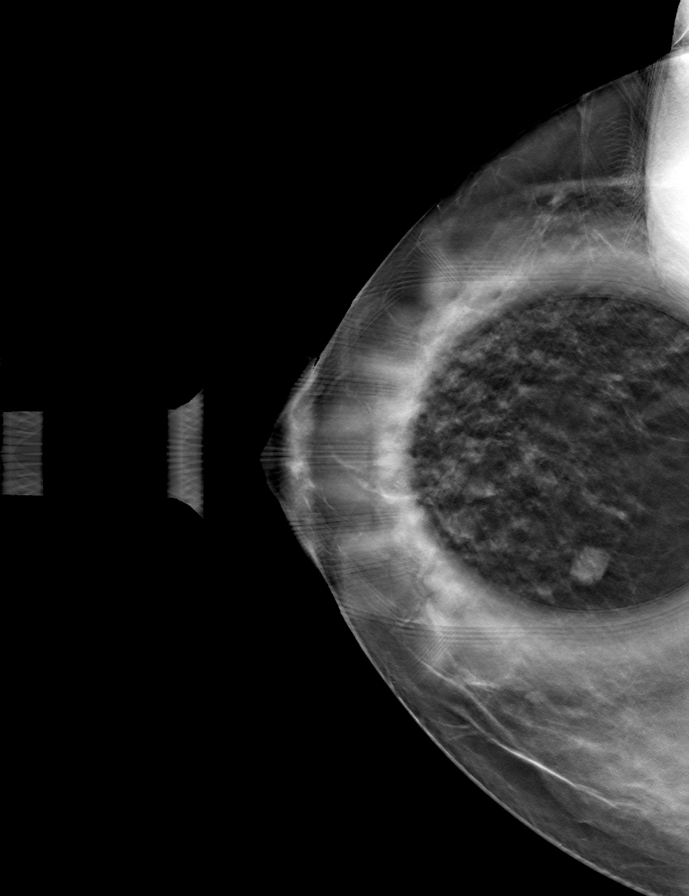

[8 of 24 positions shown; findings below may reference images not displayed]

ACR Breast Density Category c: The breast tissue is heterogeneously
dense, which may obscure small masses.
FINDINGS: Tomosynthesis and synthesized spot-compression CC and MLO views of
the areas of concern in both breasts were obtained.

RIGHT: Spot compression images confirm a mass with irregular margins
measuring approximately 1 cm in size in the UPPER INNER QUADRANT at
POSTERIOR depth. Subtle architectural distortion is associated with
the mass.

Targeted ultrasound is performed, showing a hypoechoic mass with
angular margins at the 12 o'clock position approximately 5 cm from
the nipple measuring approximately 0.9 x 0.7 x 0.9 cm, demonstrating
no posterior characteristics and demonstrating internal power
Doppler flow, corresponding to the screening mammographic finding.

Sonographic evaluation of the RIGHT axilla demonstrates no
pathologic lymphadenopathy.

LEFT: Spot compression images confirm an isodense mass measuring
just over 1 cm in size whose margins appear relatively circumscribed
in the UPPER breast at MIDDLE depth. Immediately ANTERIOR to this
mass is persistent architectural distortion, likely associated an
irregular mass.

Targeted ultrasound is performed, showing an irregular hypoechoic
mass at the 1 o'clock position approximately 2 cm from the nipple
which extends up to the skin surface at the level of the
nipple-areola complex, measuring approximately 2.5 x 1.9 x 1.8 cm,
demonstrating dense acoustic shadowing and demonstrating internal
power Doppler flow, accounting for the distortion on mammography.

POSTERIOR to the dominant mass is a benign cyst measuring
approximately 1.3 x 0.9 x 1.1 cm, demonstrating posterior acoustic
enhancement and no internal power Doppler flow, accounting for the
mass on mammography.

Sonographic evaluation of the LEFT axilla demonstrates no pathologic
lymphadenopathy.

On correlative physical exam, there is palpable thickening in the
UPPER OUTER periareolar LEFT breast with slight retraction of the
LEFT nipple. There is no palpable abnormality in the UPPER RIGHT
breast.
IMPRESSION: 1. Highly suspicious approximate 2.5 cm mass involving the UPPER
INNER periareolar LEFT breast at the 1 o'clock position
approximately 2 cm with, with likely involvement of the skin of the
nipple-areola complex.
2. Suspicious approximate 0.9 cm mass involving the UPPER RIGHT
breast at the 12 o'clock position approximately 5 cm nipple.
3. No pathologic lymphadenopathy involving either the RIGHT or LEFT
axilla.
4. Benign cyst immediately POSTERIOR to the highly suspicious LEFT
breast mass.

RECOMMENDATION:
Ultrasound-guided core needle biopsy of the highly suspicious LEFT
breast mass and the suspicious RIGHT breast mass.

The ultrasound core needle biopsy procedure was discussed with the
patient and her questions were answered. She wishes to proceed and
the biopsy has been scheduled at her convenience.

I have discussed the findings and recommendations with the patient.

BI-RADS CATEGORY  5: Highly suggestive of malignancy.

## 2022-04-03 ENCOUNTER — Other Ambulatory Visit: Payer: Self-pay | Admitting: Family Medicine

## 2022-04-03 DIAGNOSIS — F4323 Adjustment disorder with mixed anxiety and depressed mood: Secondary | ICD-10-CM

## 2022-04-05 ENCOUNTER — Encounter: Payer: BC Managed Care – PPO | Admitting: Family Medicine

## 2022-04-08 ENCOUNTER — Encounter: Payer: Self-pay | Admitting: *Deleted

## 2022-04-09 ENCOUNTER — Encounter: Payer: Self-pay | Admitting: Family Medicine

## 2022-04-09 ENCOUNTER — Ambulatory Visit (INDEPENDENT_AMBULATORY_CARE_PROVIDER_SITE_OTHER): Payer: BC Managed Care – PPO | Admitting: Family Medicine

## 2022-04-09 VITALS — BP 136/70 | HR 73 | Temp 97.7°F | Ht 65.0 in | Wt 208.4 lb

## 2022-04-09 DIAGNOSIS — Z0000 Encounter for general adult medical examination without abnormal findings: Secondary | ICD-10-CM

## 2022-04-09 DIAGNOSIS — J439 Emphysema, unspecified: Secondary | ICD-10-CM

## 2022-04-09 DIAGNOSIS — Z0001 Encounter for general adult medical examination with abnormal findings: Secondary | ICD-10-CM

## 2022-04-09 DIAGNOSIS — I700 Atherosclerosis of aorta: Secondary | ICD-10-CM

## 2022-04-09 DIAGNOSIS — Z79899 Other long term (current) drug therapy: Secondary | ICD-10-CM

## 2022-04-09 DIAGNOSIS — Z23 Encounter for immunization: Secondary | ICD-10-CM

## 2022-04-09 DIAGNOSIS — E785 Hyperlipidemia, unspecified: Secondary | ICD-10-CM | POA: Diagnosis not present

## 2022-04-09 DIAGNOSIS — M810 Age-related osteoporosis without current pathological fracture: Secondary | ICD-10-CM | POA: Diagnosis not present

## 2022-04-09 MED ORDER — UMECLIDINIUM-VILANTEROL 62.5-25 MCG/ACT IN AEPB: 1.0000 | INHALATION_SPRAY | Freq: Every day | RESPIRATORY_TRACT | 3 refills | Status: DC

## 2022-04-09 NOTE — Assessment & Plan Note (Signed)
#   COPD/emphysema S: gets cough and wheezing issues with no significant sputum most nights. Some shortness of breath with walking/climbing. Has not tried albuterol A/P: poor control of emphysema- will trial anoro to improve symptoms.  - can still use albuterol as needed- symptoms do not get severe- just ongoing mild to moderate annoying symptoms

## 2022-04-09 NOTE — Progress Notes (Signed)
Phone [phone] In person visit   Subjective:   [name] [name] is a 66 y.o. year old very pleasant female patient who presents for/with See problem oriented charting  Past Medical History-  Patient Active Problem List   Diagnosis Date Noted   Emphysema lung ([place]) 04/03/2021    Priority: High   Bilateral breast cancer ([place]) 03/24/2020    Priority: High   TOBACCO ABUSE 09/30/2008    Priority: High   Fatty liver 04/03/2021    Priority: Medium    Hypertension 10/20/2017    Priority: Medium    History of squamous cell carcinoma of skin 09/12/2016    Priority: Medium    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 05/23/2016    Priority: Medium    Adjustment disorder with mixed anxiety and depressed mood 08/17/2012    Priority: Medium    Hot flashes 03/23/2012    Priority: Medium    GERD 08/28/2009    Priority: Medium    Abnormal glucose 08/28/2009    Priority: Medium    PARESTHESIA 11/11/2008    Priority: Medium    Hyperlipemia 09/30/2008    Priority: Medium    Osteoporosis 09/30/2008    Priority: Medium    OSA (obstructive sleep apnea) 01/18/2013    Priority: Low   History of colonic polyps 11/11/2008    Priority: Low   Benign paroxysmal positional vertigo 10/25/2008    Priority: Low   Aortic atherosclerosis ([place]) 04/03/2021    Medications- reviewed and updated Current Outpatient Medications  Medication Sig Dispense Refill   albuterol (VENTOLIN HFA) 108 (90 Base) MCG/ACT inhaler INHALE 2 PUFFS BY MOUTH EVERY 6 HOURS AS NEEDED (Patient taking differently: Inhale 2 puffs into the lungs every 6 (six) hours as needed for wheezing or shortness of breath.) 8.5 g 2   amLODipine (NORVASC) 2.5 MG tablet TAKE 1 TABLET BY MOUTH EVERY DAY 90 tablet 3   anastrozole (ARIMIDEX) 1 MG tablet TAKE 1 TABLET BY MOUTH EVERY DAY 90 tablet 3   atorvastatin (LIPITOR) 40 MG tablet TAKE 1 TABLET BY MOUTH EVERY DAY 90 tablet 3   azelastine (ASTELIN) 0.1 % nasal spray Place 2 sprays into both  nostrils 2 (two) times daily. (Patient taking differently: Place 2 sprays into both nostrils 2 (two) times daily as needed (congestion/allergies.).) 30 mL 12   citalopram (CELEXA) 20 MG tablet TAKE 1 TABLET BY MOUTH EVERY DAY 90 tablet 3   NEXIUM 40 MG capsule TAKE 1 CAPSULE BY MOUTH TWICE A DAY BEFORE A MEAL 180 capsule 3   umeclidinium-vilanterol (ANORO ELLIPTA) 62.5-25 MCG/ACT AEPB Inhale 1 puff into the lungs daily. 90 each 3   No current facility-administered medications for this visit.     Objective:  BP 136/70 (BP Location: Left Wrist)   Pulse 73   Temp 97.7 F (36.5 C)   Ht '5\' 5"'$  (1.651 m)   Wt 208 lb 6.4 oz (94.5 kg)   SpO2 94%   BMI 34.68 kg/m  Gen: NAD, resting comfortably     Assessment and Plan   # COPD/emphysema  S: gets cough and wheezing issues with no significant sputum most nights. Some shortness of breath with walking/climbing. Has not tried albuterol A/P: poor control of emphysema- will trial anoro to improve symptoms.  - can still use albuterol as needed- symptoms do not get severe- just ongoing mild to moderate annoying symptoms   Recommended follow up: Return in about 6 months (around 10/08/2022) for followup or sooner if needed.Schedule b4 you leave.  Future Appointments  Date Time Provider [place]  08/26/2022  3:30 PM Nicholas Lose, MD [facility] None   Lab/Order associations:   ICD-10-CM   1. Pulmonary emphysema, unspecified emphysema type ([place])  J43.9     Meds ordered this encounter  Medications   umeclidinium-vilanterol (ANORO ELLIPTA) 62.5-25 MCG/ACT AEPB    Sig: Inhale 1 puff into the lungs daily.    Dispense:  90 each    Refill:  3    Can adjust quantity to meet dispensing packs- looking for 3 month supply    Return precautions advised.  [name], MD

## 2022-04-09 NOTE — Patient Instructions (Addendum)
Call back in a month to consider nurse visit for Shingrix  Lets slowly build up on treadmill a minimum 5 minutes 3 days a week with long term goal 150 minutes a week.   poor control of emphysema- will trial anoro to improve symptoms -once a day  As always- quit smoking! One of the best things you can do for your health  Recommended follow up: Return in about 6 months (around 10/08/2022) for followup or sooner if needed.Schedule b4 you leave. At latest 1 year physical

## 2022-04-09 NOTE — Progress Notes (Signed)
Phone [phone]   Subjective:  Patient presents today for their annual physical. Chief complaint-noted.   See problem oriented charting- ROS- full  review of systems was completed and negative except for: cough, seasonal allergies, sinus pressure- worse when laying down, some poor sleep- mind racing  The following were reviewed and entered/updated in epic: Past Medical History:  Diagnosis Date   Abnormal glucose    Allergy    Anxiety    05/05/20   Arthritis    Breast cancer ([place]) 03/2020   COPD (chronic obstructive pulmonary disease) ([place])    Esophageal stricture    Dr.Dora Olevia Perches   GERD (gastroesophageal reflux disease)    History of colonic polyps    Hyperlipidemia    Hypertension    MVP (mitral valve prolapse)    history of    Osteoporosis    Personal history of radiation therapy    completed 08-21-2020 done bilaterally  no chemo   Tobacco abuse    tried Chantix- caused agression   Patient Active Problem List   Diagnosis Date Noted   Emphysema lung ([place]) 04/03/2021    Priority: High   Bilateral breast cancer ([place]) 03/24/2020    Priority: High   TOBACCO ABUSE 09/30/2008    Priority: High   Fatty liver 04/03/2021    Priority: Medium    Hypertension 10/20/2017    Priority: Medium    History of squamous cell carcinoma of skin 09/12/2016    Priority: Medium    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 05/23/2016    Priority: Medium    Adjustment disorder with mixed anxiety and depressed mood 08/17/2012    Priority: Medium    Hot flashes 03/23/2012    Priority: Medium    GERD 08/28/2009    Priority: Medium    Abnormal glucose 08/28/2009    Priority: Medium    PARESTHESIA 11/11/2008    Priority: Medium    Hyperlipemia 09/30/2008    Priority: Medium    Osteoporosis 09/30/2008    Priority: Medium    OSA (obstructive sleep apnea) 01/18/2013    Priority: Low   History of colonic polyps 11/11/2008    Priority: Low   Benign paroxysmal positional vertigo  10/25/2008    Priority: Low   Aortic atherosclerosis ([place]) 04/03/2021   Past Surgical History:  Procedure Laterality Date   APPENDECTOMY  2008   AXILLARY SENTINEL NODE BIOPSY Right 05/09/2020   Procedure: RIGHT AXILLARY SENTINEL NODE BIOPSY;  Surgeon: Alphonsa Overall, MD;  Location: [facility];  Service: General;  Laterality: Right;   BREAST BIOPSY  2000   BREAST BIOPSY Bilateral 2021   BREAST LUMPECTOMY WITH RADIOACTIVE SEED LOCALIZATION Right 05/09/2020   Procedure: RIGHT BREAST LUMPECTOMY WITH RADIOACTIVE SEED;  Surgeon: Alphonsa Overall, MD;  Location: [facility];  Service: General;  Laterality: Right;   COLONOSCOPY     ESOPHAGOGASTRODUODENOSCOPY     MASTECTOMY, PARTIAL Left 05/09/2020   Procedure: LEFT MASECTOMY;  Surgeon: Alphonsa Overall, MD;  Location: [facility];  Service: General;  Laterality: Left;   MOHS SURGERY     nose   SENTINEL NODE BIOPSY Left 05/09/2020   Procedure: LEFT AXILLARY SENTINEL NODE BIOPSY;  Surgeon: Alphonsa Overall, MD;  Location: [facility];  Service: General;  Laterality: Left;   SKIN GRAFT Right    5h finger after amputation    vocal cord polyps removed     [facility], Dr Erik Obey    Family History  Problem Relation Age of Onset   Hypertension Mother    Cancer  Mother        apendix. right hemicolectomy preventative surgery   Other Mother        MGUS   Alcohol abuse Father    Cirrhosis Father        deceased form cirrhosis   Hypertension Father    Hypertension Sister    Hypertension Brother    Hyperlipidemia Other    Hiatal hernia Other        several family members   Other Other        Fh of abnormal glucose   Colon cancer Neg Hx    Esophageal cancer Neg Hx    Rectal cancer Neg Hx    Colon polyps Neg Hx    Stomach cancer Neg Hx     Medications- reviewed and updated Current Outpatient Medications  Medication Sig Dispense Refill   albuterol (VENTOLIN HFA) 108 (90 Base) MCG/ACT inhaler INHALE 2 PUFFS BY MOUTH EVERY 6 HOURS AS NEEDED (Patient taking differently:  Inhale 2 puffs into the lungs every 6 (six) hours as needed for wheezing or shortness of breath.) 8.5 g 2   amLODipine (NORVASC) 2.5 MG tablet TAKE 1 TABLET BY MOUTH EVERY DAY 90 tablet 3   anastrozole (ARIMIDEX) 1 MG tablet TAKE 1 TABLET BY MOUTH EVERY DAY 90 tablet 3   atorvastatin (LIPITOR) 40 MG tablet TAKE 1 TABLET BY MOUTH EVERY DAY 90 tablet 3   azelastine (ASTELIN) 0.1 % nasal spray Place 2 sprays into both nostrils 2 (two) times daily. (Patient taking differently: Place 2 sprays into both nostrils 2 (two) times daily as needed (congestion/allergies.).) 30 mL 12   citalopram (CELEXA) 20 MG tablet TAKE 1 TABLET BY MOUTH EVERY DAY 90 tablet 3   NEXIUM 40 MG capsule TAKE 1 CAPSULE BY MOUTH TWICE A DAY BEFORE A MEAL 180 capsule 3   umeclidinium-vilanterol (ANORO ELLIPTA) 62.5-25 MCG/ACT AEPB Inhale 1 puff into the lungs daily. 90 each 3   No current facility-administered medications for this visit.    Allergies-reviewed and updated Allergies  Allergen Reactions   Penicillins Other (See Comments)    whelps, ,skin hot to touch   Reclast [Zoledronic Acid]     Severe diffuse muscle aches- hard to move    Social History   Social History Narrative   Married over 30 years. Sons 29,35 in 2017. 2 grandkis- 1 grandaughter - [place] Haw . 1 grandson. Hudson lewis. All grandkids same son      Cared for mother before death in [id number], husband alcoholic- tremendous stress. Sister stays with mom- stressor      Occupation: office admin - [employer]    Objective  Objective:  BP 136/70 (BP Location: Left Wrist)   Pulse 73   Temp 97.7 F (36.5 C)   Ht '5\' 5"'$  (1.651 m)   Wt 208 lb 6.4 oz (94.5 kg)   SpO2 94%   BMI 34.68 kg/m  Gen: NAD, resting comfortably HEENT: Mucous membranes are moist. Oropharynx normal Neck: no thyromegaly CV: RRR no murmurs rubs or gallops Lungs: CTAB no crackles, wheeze, rhonchi Abdomen: soft/nontender/nondistended/normal bowel sounds. No rebound or guarding.   Ext: no edema Skin: warm, dry Neuro: grossly normal, moves all extremities, PERRLA   Assessment and Plan   66 y.o. female presenting for annual physical.  Health Maintenance counseling: 1. Anticipatory guidance: Patient counseled regarding regular dental exams -q6 months, eye exams - yearly,  avoiding smoking and second hand smoke , limiting alcohol to 1 beverage per day- none at  present , no illicit drugs .   2. Risk factor reduction:  Advised patient of need for regular exercise and diet rich and fruits and vegetables to reduce risk of heart attack and stroke.  Exercise- not regular- active but discussed focusing on intentional exercise goal 150 minutes a week long term- has treadmill and plans to get out.  Diet/weight management-weight up 8 pounds in the last year- .  Wt Readings from Last 3 Encounters:  04/09/22 208 lb 6.4 oz (94.5 kg)  01/22/22 200 lb (90.7 kg)  01/08/22 200 lb (90.7 kg)  3. Immunizations/screenings/ancillary studies-flu shot today, discussed Shingrix at pharmacy- wants to hold off for now- may come back in a month, discussed Prevnar 20-wants to wait until 66, discussed RSV- holding off for now, updated covid shot wants to hold off for now  Immunization History  Administered Date(s) Administered   Influenza,inj,Quad PF,6+ Mos 04/29/2013, 06/01/2015, 05/02/2016, 04/11/2019, 03/24/2020, 04/03/2021, 04/09/2022   PFIZER(Purple Top)SARS-COV-2 Vaccination 12/09/2019, 01/01/2020   Pneumococcal Polysaccharide-23 11/01/2012   Td 02/03/2007   Tdap 10/20/2017   4. Cervical cancer screening- 03/08/2020-not yet due for repeat with GYN-due next year 5. Breast cancer screening-  breast exam with oncology-mammogram 03/12/2021. (But also had diagnostic in September 2022 and then may 2023) History of bilateral breast cancer September 2021-with ongoing antiestrogen therapy with anastrozole for 7 years after adjuvant radiation after lumpectomy on the right and mastectomy on the left 6.  Colon cancer screening - January 22 2022 with 3-year repeat planned 7. Skin cancer screening- full body exam with derm  within last year. advised regular sunscreen use. Denies worrisome, changing, or new skin lesions.  8. Birth control/STD check- postmenopausal and monogamous 9. Osteoporosis screening at 57- 03/08/2020 osteoporosis based on prior treatment- we will plan on 2-3 -year repeat (she plans to [facility] with [facility]- that is the best choice so it can be on same machine) .  She did not tolerate Reclast in the past-had been recommended due to anastrozole.  She does not want to use Prolia.  She did previously have some dental work so also wanted to hold off which is understandable 10. Smoking associated screening - Current smoker (1 PPD) -not candidate for lung cancer screening despite ongoing smoking due to cancer  Status of chronic or acute concerns   #social update- husband will be retiring soon and will be going on medicare with supplement  #hypertension S: medication: amlodipine 2.5 mg BP Readings from Last 3 Encounters:  04/09/22 136/70  01/22/22 124/65  05/23/21 (!) 157/80  A/P: Controlled. Continue current medications.    #hyperlipidemia #Aortic atherosclerosis S: Medication:atorvastatin  40 mg Lab Results  Component Value Date   CHOL 157 04/03/2021   HDL 39.40 04/03/2021   LDLCALC 89 04/03/2021   LDLDIRECT 190.0 12/18/2017   TRIG 146.0 04/03/2021   CHOLHDL 4 04/03/2021   A/P: hopefully stable or improved-  update lipids. Aortic atherosclerosis (goal LDL 70 or less but still presume stable)- for now continue current meds  # GERD S:Medication: nexium twice a day - will have to switch B12 levels related to PPI use: Lab Results  Component Value Date   VITAMINB12 422 04/03/2021  A/P: stable- continue current meds. Check b12 with long term PPI use    # Depression S: Medication:Celexa 20 mg    04/09/2022    2:10 PM 04/03/2021    1:24 PM 03/24/2020    3:27 PM   Depression screen PHQ 2/9  Decreased Interest 0 0 2  Down, Depressed, Hopeless 0 0 2  PHQ - 2 Score 0 0 4  Altered sleeping 0  0  Tired, decreased energy 0  0  Change in appetite 0  0  Feeling bad or failure about yourself  0  0  Trouble concentrating 0  0  Moving slowly or fidgety/restless 0  0  Suicidal thoughts 0  0  PHQ-9 Score 0  4  Difficult doing work/chores Not difficult at all    A/P: full remission- continue current meds- does not want to change dose  #seasonal allergies- ongoing issues- on clindamycin through dentist and didn't really help. Some sinus pressure- rare sudafed (and BP has been ok)  -astelin helps some but not using consistently- advised her to try this consistnetly and follow up with [place] if not improving  #COPD- see problem oriented separate note   Recommended follow up: Return in about 6 months (around 10/08/2022) for followup or sooner if needed.Schedule b4 you leave. Future Appointments  Date Time Provider [place]  08/26/2022  3:30 PM Nicholas Lose, MD [facility] None   Lab/Order associations: fasting   ICD-10-CM   1. Encounter for general adult medical examination with abnormal findings  Z00.01     2. Need for immunization against influenza  Z23 Flu Vaccine QUAD 21moIM (Fluarix, Fluzone & Alfiuria Quad PF)    3. Hyperlipidemia, unspecified hyperlipidemia type  E78.5     4. Osteoporosis, unspecified osteoporosis type, unspecified pathological fracture presence  M81.0     5. High risk medication use  Z79.899        Return precautions advised.  SGarret Reddish MD

## 2022-04-17 ENCOUNTER — Other Ambulatory Visit: Payer: Self-pay

## 2022-04-17 ENCOUNTER — Telehealth: Payer: Self-pay | Admitting: Family Medicine

## 2022-04-17 DIAGNOSIS — Z79899 Other long term (current) drug therapy: Secondary | ICD-10-CM

## 2022-04-17 NOTE — Telephone Encounter (Signed)
FYI  Called and LVM to schedule lab visit for repeat B12 per PCP.

## 2022-05-21 ENCOUNTER — Telehealth: Payer: Self-pay

## 2022-05-21 NOTE — Patient Outreach (Signed)
  Care Coordination   05/21/2022 Name: [name] [name] MRN: [id number] DOB: [birth year]   Care Coordination Outreach Attempts:  An unsuccessful telephone outreach was attempted today to offer the patient information about available care coordination services as a benefit of their health plan.   Follow Up Plan:  Additional outreach attempts will be made to offer the patient care coordination information and services.   Encounter Outcome:  No Answer  Care Coordination Interventions Activated:  No   Care Coordination Interventions:  No, not indicated    Enzo Montgomery, RN,BSN,CCM [facility] Telephonic Care Management Coordinator Direct Phone: [phone] Toll Free: [phone] Fax: [phone]

## 2022-05-28 ENCOUNTER — Telehealth: Payer: Self-pay

## 2022-05-28 NOTE — Patient Outreach (Signed)
  Care Coordination   05/28/2022 Name: [name] [name] MRN: [id number] DOB: [birth year]   Care Coordination Outreach Attempts:  A second unsuccessful outreach was attempted today to offer the patient with information about available care coordination services as a benefit of their health plan.     Follow Up Plan:  Additional outreach attempts will be made to offer the patient care coordination information and services.   Encounter Outcome:  No Answer  Care Coordination Interventions Activated:  No   Care Coordination Interventions:  No, not indicated    Enzo Montgomery, RN,BSN,CCM [facility] Telephonic Care Management Coordinator Direct Phone: [phone] Toll Free: [phone] Fax: [phone]

## 2022-06-19 ENCOUNTER — Telehealth: Payer: Self-pay

## 2022-06-19 NOTE — Patient Outreach (Signed)
  Care Coordination   06/19/2022 Name: [name] [name] MRN: [id number] DOB: 05/12/1956   Care Coordination Outreach Attempts:  A third unsuccessful outreach was attempted today to offer the patient with information about available care coordination services as a benefit of their health plan.   Follow Up Plan:  No further outreach attempts will be made at this time. We have been unable to contact the patient to offer or enroll patient in care coordination services  Encounter Outcome:  No Answer   Care Coordination Interventions:  No, not indicated      Enzo Montgomery, RN,BSN,CCM [facility] Telephonic Care Management Coordinator Direct Phone: [phone] Toll Free: [phone] Fax: [phone]

## 2022-07-22 ENCOUNTER — Telehealth: Payer: Self-pay | Admitting: *Deleted

## 2022-07-22 NOTE — Patient Outreach (Signed)
  Care Coordination   07/22/2022 Name: ICEY TELLO MRN: [id number] DOB: Oct 22, 1955   Care Coordination Outreach Attempts:  An unsuccessful telephone outreach was attempted today to offer the patient information about available care coordination services as a benefit of their health plan.   Follow Up Plan:  Additional outreach attempts will be made to offer the patient care coordination information and services.   Encounter Outcome:  No Answer   Care Coordination Interventions:  No, not indicated    Raina Mina, RN Care Management Coordinator [place] Office [phone]

## 2022-08-23 NOTE — Progress Notes (Signed)
Patient Care Team: [name], MD as PCP - General (Family Medicine) Nicholas Lose, MD as Consulting Physician (Hematology and Oncology) Kyung Rudd, MD as Consulting Physician (Radiation Oncology) Alphonsa Overall, MD as Consulting Physician (General Surgery)  DIAGNOSIS: No diagnosis found.  SUMMARY OF ONCOLOGIC HISTORY: Oncology History  Bilateral breast cancer ([place])  03/15/2020 Initial Diagnosis   Right breast, 0.9cm right breast mass at the 12 o'clock position, and no axillary adenopathy. grade 1 invasive ductal carcinoma with high grade DCIS, HER-2 equivocal by IHC, negative by FISH, ER+ 100%, PR+ 100%, Ki67 2% T1BN0 stage Ia left breast: 2.5cm mass in the left breast at the 1 o'clock position with likely skin involvement in the nipple-areola complex, Invasive lobular carcinoma, grade 2, HER-2 negative (1+), ER+ 80%, PR+ 50%, Ki67 2%. T4N0 stage IIIb   05/09/2020 Surgery   Right lumpectomy and left mastectomy [name]) [phone]): Right breast: invasive and in situ ductal carcinoma, 1.0cm, clear margins, 2 right axillary lymph nodes negative for carcinoma Left breast: invasive and in situ lobular carcinoma, 6.0cm, clear margins, 1/2 left axillary lymph nodes with isolated tumor cells.   05/09/2020 Cancer Staging   Staging form: Breast, AJCC 8th Edition - Pathologic stage from 05/09/2020: Stage IA (pT1b, pN0, cM0, G1, ER+, PR+, HER2-)   05/18/2020 Cancer Staging   Staging form: Breast, AJCC 8th Edition - Pathologic stage from 05/18/2020: Stage IIIA (pT4, pN0(mol+), cM0, G2, ER+, PR+, HER2-)   06/15/2020 Oncotype testing   Oncotype score: 2, distant recurrence at 9 years: 3%   07/03/2020 - 08/21/2020 Radiation Therapy   The patient initially received a dose of 50.40 Gy in 28 fractions to the RIGHT breast, LEFT chest wall, and LEFT supraclavicular area. using whole-breast tangent fields. This was delivered using a 3-D conformal technique. The pt received a boost delivering an  additional 10 Gy in 5 fractions to the LEFT chest wall and RIGHT breast using a electron boost with 47mV electrons.   08/2020 - 08/2027 Anti-estrogen oral therapy   Anastrozole     CHIEF COMPLIANT: Follow up bilateral breast cancer   INTERVAL HISTORY: [name] PETERMANis a 67y.o. female with above-mentioned history of bilateral breast cancer who underwent a right lumpectomy and left mastectomy and radiation therapy, currently on antiestrogen therapy. She presents to the clinic for a follow-up.   ALLERGIES:  is allergic to penicillins and reclast [zoledronic acid].  MEDICATIONS:  Current Outpatient Medications  Medication Sig Dispense Refill   albuterol (VENTOLIN HFA) 108 (90 Base) MCG/ACT inhaler INHALE 2 PUFFS BY MOUTH EVERY 6 HOURS AS NEEDED (Patient taking differently: Inhale 2 puffs into the lungs every 6 (six) hours as needed for wheezing or shortness of breath.) 8.5 g 2   amLODipine (NORVASC) 2.5 MG tablet TAKE 1 TABLET BY MOUTH EVERY DAY 90 tablet 3   anastrozole (ARIMIDEX) 1 MG tablet TAKE 1 TABLET BY MOUTH EVERY DAY 90 tablet 3   atorvastatin (LIPITOR) 40 MG tablet TAKE 1 TABLET BY MOUTH EVERY DAY 90 tablet 3   azelastine (ASTELIN) 0.1 % nasal spray Place 2 sprays into both nostrils 2 (two) times daily. (Patient taking differently: Place 2 sprays into both nostrils 2 (two) times daily as needed (congestion/allergies.).) 30 mL 12   citalopram (CELEXA) 20 MG tablet TAKE 1 TABLET BY MOUTH EVERY DAY 90 tablet 3   NEXIUM 40 MG capsule TAKE 1 CAPSULE BY MOUTH TWICE A DAY BEFORE A MEAL 180 capsule 3   umeclidinium-vilanterol (ANORO ELLIPTA) 62.5-25 MCG/ACT AEPB Inhale  1 puff into the lungs daily. 90 each 3   No current facility-administered medications for this visit.    PHYSICAL EXAMINATION: ECOG PERFORMANCE STATUS: {CHL ONC ECOG [phone]}  There were no vitals filed for this visit. There were no vitals filed for this visit.  BREAST:*** No palpable masses or nodules in either  right or left breasts. No palpable axillary supraclavicular or infraclavicular adenopathy no breast tenderness or nipple discharge. (exam performed in the presence of a chaperone)  LABORATORY DATA:  I have reviewed the data as listed    Latest Ref Rng & Units 04/03/2021    2:18 PM 02/08/2021    1:14 PM 05/05/2020   10:50 AM  CMP  Glucose 70 - 99 mg/dL 99   112   BUN 6 - 23 mg/dL 7   7   Creatinine 0.40 - 1.20 mg/dL 0.57  0.60  0.59   Sodium 135 - 145 mEq/L 141   141   Potassium 3.5 - 5.1 mEq/L 4.0   3.7   Chloride 96 - 112 mEq/L 103   104   CO2 19 - 32 mEq/L 29   27   Calcium 8.4 - 10.5 mg/dL 9.4   9.5   Total Protein 6.0 - 8.3 g/dL 7.4     Total Bilirubin 0.2 - 1.2 mg/dL 0.7     Alkaline Phos 39 - 117 U/L 117     AST 0 - 37 U/L 12     ALT 0 - 35 U/L 10       Lab Results  Component Value Date   WBC 8.0 04/03/2021   HGB 13.8 04/03/2021   HCT 40.9 04/03/2021   MCV 89.6 04/03/2021   PLT 209.0 04/03/2021   NEUTROABS 6.3 04/03/2021    ASSESSMENT & PLAN:  No problem-specific Assessment & Plan notes found for this encounter.    No orders of the defined types were placed in this encounter.  The patient has a good understanding of the overall plan. she agrees with it. she will call with any problems that may develop before the next visit here. Total time spent: 30 mins including face to face time and time spent for planning, charting and co-ordination of care   Suzzette Righter, [place] 08/23/22    I Gardiner Coins am acting as a Education administrator for [employer]  ***

## 2022-08-26 ENCOUNTER — Inpatient Hospital Stay: Payer: Medicare Other | Attending: Hematology and Oncology | Admitting: Hematology and Oncology

## 2022-08-26 VITALS — BP 156/85 | HR 82 | Temp 97.5°F | Resp 16 | Wt 214.2 lb

## 2022-08-26 DIAGNOSIS — Z170 Estrogen receptor positive status [ER+]: Secondary | ICD-10-CM | POA: Insufficient documentation

## 2022-08-26 DIAGNOSIS — C50911 Malignant neoplasm of unspecified site of right female breast: Secondary | ICD-10-CM | POA: Diagnosis not present

## 2022-08-26 DIAGNOSIS — Z79811 Long term (current) use of aromatase inhibitors: Secondary | ICD-10-CM | POA: Diagnosis not present

## 2022-08-26 DIAGNOSIS — C50412 Malignant neoplasm of upper-outer quadrant of left female breast: Secondary | ICD-10-CM | POA: Insufficient documentation

## 2022-08-26 DIAGNOSIS — Z9011 Acquired absence of right breast and nipple: Secondary | ICD-10-CM | POA: Insufficient documentation

## 2022-08-26 DIAGNOSIS — C50811 Malignant neoplasm of overlapping sites of right female breast: Secondary | ICD-10-CM | POA: Diagnosis present

## 2022-08-26 DIAGNOSIS — C50912 Malignant neoplasm of unspecified site of left female breast: Secondary | ICD-10-CM

## 2022-08-26 DIAGNOSIS — C792 Secondary malignant neoplasm of skin: Secondary | ICD-10-CM | POA: Diagnosis not present

## 2022-08-26 NOTE — Assessment & Plan Note (Signed)
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 grade 2, HER-2 negative (1+), ER+ 80%, PR+ 50%, Ki67 2%. T4N0 stage IIIA   CT CAP 04/06/2020: Stomach wall thickening?  Gastritis, small pulmonary nodules 4 mm and 3 mm, hepatomegaly with suspicion for portal gastropathy however no frank signs of cirrhosis.  No metastatic disease Bone scan 04/06/2020: No bone metastatic disease.    Oncotype DX: 2, distant recurrence at 9 years: 3%   Treatment plan: 1. Adjuvant radiation therapy 07/03/2020-08/21/2020 2. Adjuvant antiestrogen therapy with anastrozole 1 mg daily x7 years,  started November 2022   Anastrozole toxicities: Denies hot flashes No joint stiffness Hair loss: encouraged her to use coconut oil Decreased sleep: encouraged to take it AM   Breast cancer surveillance:  Mammograms and ultrasound 11/23/2021: Clinical follow-up with the tender area of concern recommended otherwise density is category C Breast exam 08/26/2021: Benign   Return to clinic in 1 year for follow-up

## 2022-09-02 NOTE — Progress Notes (Signed)
HEMATOLOGY-ONCOLOGY TELEPHONE VISIT PROGRESS NOTE  I connected with our patient on 09/04/22 at 11:00 AM EST by telephone and verified that I am speaking with the correct person using two identifiers.  I discussed the limitations, risks, security and privacy concerns of performing an evaluation and management service by telephone and the availability of in person appointments.  I also discussed with the patient that there may be a patient responsible charge related to this service. The patient expressed understanding and agreed to proceed.   History of Present Illness: Follow-up to discuss results of the CT scan done for lung nodules.  Oncology History  Bilateral breast cancer ([place])  03/15/2020 Initial Diagnosis   Right breast, 0.9cm right breast mass at the 12 o'clock position, and no axillary adenopathy. grade 1 invasive ductal carcinoma with high grade DCIS, HER-2 equivocal by IHC, negative by FISH, ER+ 100%, PR+ 100%, Ki67 2% T1BN0 stage Ia left breast: 2.5cm mass in the left breast at the 1 o'clock position with likely skin involvement in the nipple-areola complex, Invasive lobular carcinoma, grade 2, HER-2 negative (1+), ER+ 80%, PR+ 50%, Ki67 2%. T4N0 stage IIIb   05/09/2020 Surgery   Right lumpectomy and left mastectomy [name]) [phone]): Right breast: invasive and in situ ductal carcinoma, 1.0cm, clear margins, 2 right axillary lymph nodes negative for carcinoma Left breast: invasive and in situ lobular carcinoma, 6.0cm, clear margins, 1/2 left axillary lymph nodes with isolated tumor cells.   05/09/2020 Cancer Staging   Staging form: Breast, AJCC 8th Edition - Pathologic stage from 05/09/2020: Stage IA (pT1b, pN0, cM0, G1, ER+, PR+, HER2-)   05/18/2020 Cancer Staging   Staging form: Breast, AJCC 8th Edition - Pathologic stage from 05/18/2020: Stage IIIA (pT4, pN0(mol+), cM0, G2, ER+, PR+, HER2-)   06/15/2020 Oncotype testing   Oncotype score: 2, distant recurrence at 9 years:  3%   07/03/2020 - 08/21/2020 Radiation Therapy   The patient initially received a dose of 50.40 Gy in 28 fractions to the RIGHT breast, LEFT chest wall, and LEFT supraclavicular area. using whole-breast tangent fields. This was delivered using a 3-D conformal technique. The pt received a boost delivering an additional 10 Gy in 5 fractions to the LEFT chest wall and RIGHT breast using a electron boost with 49mV electrons.   08/2020 - 08/2027 Anti-estrogen oral therapy   Anastrozole     REVIEW OF SYSTEMS:   Constitutional: Denies fevers, chills or abnormal weight loss All other systems were reviewed with the patient and are negative. Observations/Objective:     Assessment Plan:  Bilateral breast cancer ([facility] Right lumpectomy and left mastectomy ([name]: 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 grade 2, HER-2 negative (1+), ER+ 80%, PR+ 50%, Ki67 2%. T4N0 stage IIIA   CT CAP 04/06/2020: Stomach wall thickening?  Gastritis, small pulmonary nodules 4 mm and 3 mm, hepatomegaly with suspicion for portal gastropathy however no frank signs of cirrhosis.  No metastatic disease Bone scan 04/06/2020: No bone metastatic disease.    Oncotype DX: 2, distant recurrence at 9 years: 3%   Treatment plan: 1. Adjuvant radiation therapy 07/03/2020-08/21/2020 2. Adjuvant antiestrogen therapy with anastrozole 1 mg daily x7 years,  started November 2022   Anastrozole toxicities: Denies hot flashes No joint stiffness  Breast cancer surveillance:  Mammograms and ultrasound 11/23/2021: Clinical follow-up with the tender area of concern recommended otherwise density is category C Breast exam 09/04/2022: Benign   Lung nodules: CT  chest 09/03/2022: Emphysema with tiny nodules stable going back to 2021, some of the nodules were not clearly seen on the current scan fatty liver  Based on stable CT findings we can conclude that these lung nodules are benign. Return to clinic in 1 year for follow-up    I discussed the assessment and treatment plan with the patient. The patient was provided an opportunity to ask questions and all were answered. The patient agreed with the plan and demonstrated an understanding of the instructions. The patient was advised to call back or seek an in-person evaluation if the symptoms worsen or if the condition fails to improve as anticipated.   I provided 12 minutes of non-face-to-face time during this encounter.  This includes time for charting and coordination of care   Harriette Ohara, MD  I Gardiner Coins am acting as a scribe for Dr.Vinay Gudena  I have reviewed the above documentation for accuracy and completeness, and I agree with the above.

## 2022-09-03 ENCOUNTER — Ambulatory Visit (HOSPITAL_COMMUNITY)
Admission: RE | Admit: 2022-09-03 | Discharge: 2022-09-03 | Disposition: A | Discharge status: Home / Self Care | Payer: Medicare Other | Source: Ambulatory Visit | Attending: Hematology and Oncology | Admitting: Hematology and Oncology

## 2022-09-03 DIAGNOSIS — C50911 Malignant neoplasm of unspecified site of right female breast: Secondary | ICD-10-CM | POA: Diagnosis not present

## 2022-09-03 DIAGNOSIS — C50912 Malignant neoplasm of unspecified site of left female breast: Secondary | ICD-10-CM | POA: Insufficient documentation

## 2022-09-03 MED ORDER — SODIUM CHLORIDE (PF) 0.9 % IJ SOLN
INTRAMUSCULAR | Status: AC
  Filled 2022-09-03: qty 50

## 2022-09-03 MED ORDER — IOHEXOL 300 MG/ML  SOLN
75.0000 mL | Freq: Once | INTRAMUSCULAR | Status: AC | PRN
  Administered 2022-09-03: 75 mL via INTRAVENOUS

## 2022-09-03 NOTE — Assessment & Plan Note (Signed)
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 grade 2, HER-2 negative (1+), ER+ 80%, PR+ 50%, Ki67 2%. T4N0 stage IIIA   CT CAP 04/06/2020: Stomach wall thickening?  Gastritis, small pulmonary nodules 4 mm and 3 mm, hepatomegaly with suspicion for portal gastropathy however no frank signs of cirrhosis.  No metastatic disease Bone scan 04/06/2020: No bone metastatic disease.    Oncotype DX: 2, distant recurrence at 9 years: 3%   Treatment plan: 1. Adjuvant radiation therapy 07/03/2020-08/21/2020 2. Adjuvant antiestrogen therapy with anastrozole 1 mg daily x7 years,  started November 2022   Anastrozole toxicities: Denies hot flashes No joint stiffness   Breast cancer surveillance:  Mammograms and ultrasound 11/23/2021: Clinical follow-up with the tender area of concern recommended otherwise density is category C Breast exam 09/04/2022: Benign   Lung nodules: Will obtain CT chest for further evaluation. Return to clinic in 1 year for follow-up

## 2022-09-04 ENCOUNTER — Inpatient Hospital Stay (HOSPITAL_BASED_OUTPATIENT_CLINIC_OR_DEPARTMENT_OTHER): Payer: Medicare Other | Admitting: Hematology and Oncology

## 2022-09-04 DIAGNOSIS — C50912 Malignant neoplasm of unspecified site of left female breast: Secondary | ICD-10-CM

## 2022-09-04 DIAGNOSIS — C50911 Malignant neoplasm of unspecified site of right female breast: Secondary | ICD-10-CM

## 2022-09-05 ENCOUNTER — Telehealth: Payer: Self-pay | Admitting: Hematology and Oncology

## 2022-09-05 NOTE — Telephone Encounter (Signed)
Scheduled appointment per 2/21 los. Patient is aware of the made appointment.

## 2022-12-27 ENCOUNTER — Other Ambulatory Visit: Payer: Self-pay | Admitting: Hematology and Oncology

## 2022-12-27 DIAGNOSIS — Z853 Personal history of malignant neoplasm of breast: Secondary | ICD-10-CM

## 2023-01-14 ENCOUNTER — Other Ambulatory Visit: Payer: Self-pay | Admitting: Family Medicine

## 2023-01-22 ENCOUNTER — Ambulatory Visit
Admission: RE | Admit: 2023-01-22 | Discharge: 2023-01-22 | Disposition: A | Discharge status: Home / Self Care | Payer: Medicare Other | Source: Ambulatory Visit | Attending: Hematology and Oncology | Admitting: Hematology and Oncology

## 2023-01-22 DIAGNOSIS — Z853 Personal history of malignant neoplasm of breast: Secondary | ICD-10-CM

## 2023-01-22 LAB — HM MAMMOGRAPHY

## 2023-02-01 ENCOUNTER — Other Ambulatory Visit (HOSPITAL_COMMUNITY): Payer: Self-pay

## 2023-02-01 ENCOUNTER — Telehealth: Payer: Self-pay | Admitting: Pharmacy Technician

## 2023-02-01 NOTE — Telephone Encounter (Signed)
Pharmacy Patient Advocate Encounter   Received notification from CoverMyMeds that prior authorization for NEXIUM 40MG  is required/requested.   Insurance verification completed.   The patient is insured through [facility] .   Per test claim: PA submitted to [facility] via CoverMyMeds Key/confirmation #/EOC [id number] Status is pending

## 2023-02-04 ENCOUNTER — Other Ambulatory Visit (HOSPITAL_COMMUNITY): Payer: Self-pay

## 2023-02-04 NOTE — Telephone Encounter (Signed)
Pharmacy Patient Advocate Encounter  Received notification from [facility] [employer] that Prior Authorization for NEXIUM 40 MG delayed-release capsules has been APPROVED from 07-15-2022 to 02-01-2024. Ran test claim, Copay is $912.56 per 90 day supply. Quantity approved is 180 capsules per 90 days. Must be processed as BRAND.  PA #/Case ID/Reference #: [id number]

## 2023-02-04 NOTE — Telephone Encounter (Signed)
[name] [name], Noted  Thanks

## 2023-04-01 ENCOUNTER — Telehealth: Payer: Self-pay | Admitting: Gastroenterology

## 2023-04-01 NOTE — Telephone Encounter (Signed)
PT is calling to have generic Nexium sent to [facility] for insurance purposes. Please advise.

## 2023-04-02 ENCOUNTER — Other Ambulatory Visit: Payer: Self-pay | Admitting: Gastroenterology

## 2023-04-02 MED ORDER — ESOMEPRAZOLE MAGNESIUM 40 MG PO CPDR: 40.0000 mg | DELAYED_RELEASE_CAPSULE | Freq: Two times a day (BID) | ORAL | 3 refills | Status: DC

## 2023-04-02 NOTE — Telephone Encounter (Signed)
Left message for patient that she needs to call the office for an appointment, she has not been seen in the office since 2019 but has had a procedure in 2023.   Sent in generic Nexium to patients pharmacy for temporary supply

## 2023-04-03 ENCOUNTER — Encounter: Payer: Self-pay | Admitting: Gastroenterology

## 2023-04-03 NOTE — Telephone Encounter (Signed)
PT has scheduled an appointment for 07/30/2023

## 2023-04-07 ENCOUNTER — Other Ambulatory Visit (HOSPITAL_COMMUNITY): Payer: Self-pay

## 2023-04-08 ENCOUNTER — Ambulatory Visit: Payer: Medicare Other

## 2023-04-09 NOTE — Telephone Encounter (Signed)
Inbound call from patient requesting a call back regarding an update for omeprazole medication. Please advise, thank you.

## 2023-04-10 ENCOUNTER — Ambulatory Visit: Payer: Medicare Other

## 2023-04-10 ENCOUNTER — Other Ambulatory Visit: Payer: Self-pay | Admitting: Family Medicine

## 2023-04-10 DIAGNOSIS — F4323 Adjustment disorder with mixed anxiety and depressed mood: Secondary | ICD-10-CM

## 2023-04-10 MED ORDER — ESOMEPRAZOLE MAGNESIUM 40 MG PO CPDR: 40.0000 mg | DELAYED_RELEASE_CAPSULE | Freq: Two times a day (BID) | ORAL | 6 refills | Status: DC

## 2023-04-10 NOTE — Telephone Encounter (Signed)
Called patient and she needed generic Nexium sent for twice a day not omeprazole. I sent the rx in for her she is saying it will need a prior auth because its for twice a day. I told her Id look out for the rejection letter fax from the pharmacy   [facility] [employer]

## 2023-04-10 NOTE — Addendum Note (Signed)
Addended by: Marlowe Kays on: 04/10/2023 01:53 PM   Modules accepted: Orders

## 2023-04-10 NOTE — Telephone Encounter (Addendum)
Rx team have you guys seen a prior auth request for this medication  esomeprazole 40 mg twice a day ?   Previous PPI in her Med history are Pantoprazole and Omeprazole  diagnosis GERD  Esophagitis   Thanks   She does not need NAME BRAND NEXIUM Anymore, the prior auth request is because its two a day

## 2023-04-11 NOTE — Telephone Encounter (Signed)
PA request has been received and faxed back to the insurance with updated information

## 2023-04-15 ENCOUNTER — Other Ambulatory Visit (HOSPITAL_COMMUNITY): Payer: Self-pay

## 2023-04-15 NOTE — Telephone Encounter (Signed)
Pharmacy Patient Advocate Encounter  Received notification from [employer] that Prior Authorization for ESOMEPRAZOLE 40MG  has been APPROVED from 1.1.24 to 12.31.24   PA #/Case ID/Reference #:

## 2023-04-17 ENCOUNTER — Ambulatory Visit (INDEPENDENT_AMBULATORY_CARE_PROVIDER_SITE_OTHER): Payer: Medicare Other

## 2023-04-17 DIAGNOSIS — Z23 Encounter for immunization: Secondary | ICD-10-CM

## 2023-05-20 ENCOUNTER — Ambulatory Visit (INDEPENDENT_AMBULATORY_CARE_PROVIDER_SITE_OTHER): Payer: Medicare Other | Admitting: Family Medicine

## 2023-05-20 ENCOUNTER — Encounter: Payer: Self-pay | Admitting: Family Medicine

## 2023-05-20 VITALS — BP 132/82 | HR 82 | Temp 97.0°F | Ht 65.0 in | Wt 218.8 lb

## 2023-05-20 DIAGNOSIS — Z131 Encounter for screening for diabetes mellitus: Secondary | ICD-10-CM | POA: Diagnosis not present

## 2023-05-20 DIAGNOSIS — J439 Emphysema, unspecified: Secondary | ICD-10-CM

## 2023-05-20 DIAGNOSIS — E669 Obesity, unspecified: Secondary | ICD-10-CM

## 2023-05-20 DIAGNOSIS — F4323 Adjustment disorder with mixed anxiety and depressed mood: Secondary | ICD-10-CM

## 2023-05-20 DIAGNOSIS — I10 Essential (primary) hypertension: Secondary | ICD-10-CM

## 2023-05-20 DIAGNOSIS — E785 Hyperlipidemia, unspecified: Secondary | ICD-10-CM | POA: Diagnosis not present

## 2023-05-20 DIAGNOSIS — M810 Age-related osteoporosis without current pathological fracture: Secondary | ICD-10-CM

## 2023-05-20 DIAGNOSIS — C50911 Malignant neoplasm of unspecified site of right female breast: Secondary | ICD-10-CM

## 2023-05-20 DIAGNOSIS — F17200 Nicotine dependence, unspecified, uncomplicated: Secondary | ICD-10-CM | POA: Diagnosis not present

## 2023-05-20 DIAGNOSIS — Z79899 Other long term (current) drug therapy: Secondary | ICD-10-CM | POA: Diagnosis not present

## 2023-05-20 DIAGNOSIS — Z23 Encounter for immunization: Secondary | ICD-10-CM | POA: Diagnosis not present

## 2023-05-20 DIAGNOSIS — I700 Atherosclerosis of aorta: Secondary | ICD-10-CM

## 2023-05-20 NOTE — Patient Instructions (Addendum)
Prevnar 20 today In addition to flu shot  Let us know when you get DEXA with gynecology and have them send [place] a copy  Please stop by lab before you go If you have mychart- we will send your results within 3 business days of [place] receiving them.  If you do not have mychart- we will call you about results within 5 business days of [place] receiving them.  *please also note that you will see labs on mychart as soon as they post. I will later go in and write notes on them- will say "notes from Dr. Durene Cal"   Recommended follow up: Return in about 6 months (around 11/17/2023) for followup or sooner if needed.Schedule b4 you leave.

## 2023-05-20 NOTE — Addendum Note (Signed)
Addended by: Gwenette Greet on: 05/20/2023 03:47 PM   Modules accepted: Orders

## 2023-05-20 NOTE — Progress Notes (Signed)
Phone [phone]   Subjective:  Patient presents today for their annual physical. Chief complaint-noted.   See problem oriented charting- ROS- full  review of systems was completed and negative Per full ROS sheet completed by patient  The following were reviewed and entered/updated in epic: Past Medical History:  Diagnosis Date   Abnormal glucose    Allergy    Anxiety    05/05/20   Arthritis    Breast cancer (HCC) 03/2020   COPD (chronic obstructive pulmonary disease) (HCC)    Esophageal stricture    Dr.Dora Juanda Chance   GERD (gastroesophageal reflux disease)    History of colonic polyps    Hyperlipidemia    Hypertension    MVP (mitral valve prolapse)    history of    Osteoporosis    Personal history of radiation therapy    completed 08-21-2020 done bilaterally  no chemo   Tobacco abuse    tried Chantix- caused agression   Patient Active Problem List   Diagnosis Date Noted   Emphysema lung (HCC) 04/03/2021    Priority: High   Bilateral breast cancer (HCC) 03/24/2020    Priority: High   TOBACCO ABUSE 09/30/2008    Priority: High   Fatty liver 04/03/2021    Priority: Medium    Aortic atherosclerosis (HCC) 04/03/2021    Priority: Medium    Hypertension 10/20/2017    Priority: Medium    History of squamous cell carcinoma of skin 09/12/2016    Priority: Medium    Leukemoid reaction- per Dr. Myna Hidalgo 05/23/2016    Priority: Medium    Adjustment disorder with mixed anxiety and depressed mood 08/17/2012    Priority: Medium    Hot flashes 03/23/2012    Priority: Medium    GERD 08/28/2009    Priority: Medium    Abnormal glucose 08/28/2009    Priority: Medium    PARESTHESIA 11/11/2008    Priority: Medium    Hyperlipemia 09/30/2008    Priority: Medium    Osteoporosis 09/30/2008    Priority: Medium    OSA (obstructive sleep apnea) 01/18/2013    Priority: Low   History of colonic polyps 11/11/2008    Priority: Low   Benign paroxysmal positional vertigo 10/25/2008     Priority: Low   Past Surgical History:  Procedure Laterality Date   APPENDECTOMY  2008   AXILLARY SENTINEL NODE BIOPSY Right 05/09/2020   Procedure: RIGHT AXILLARY SENTINEL NODE BIOPSY;  Surgeon: Ovidio Kin, MD;  Location[facility];  Service: General;  Laterality: Right;   BREAST BIOPSY  2000   BREAST BIOPSY Bilateral 2021   BREAST LUMPECTOMY     BREAST LUMPECTOMY WITH RADIOACTIVE SEED LOCALIZATION Right 05/09/2020   Procedure: RIGHT BREAST LUMPECTOMY WITH RADIOACTIVE SEED;  Surgeon: Ovidio Kin, MD;  Location: [facility] OR;  Service: General;  Laterality: Right;   COLONOSCOPY     ESOPHAGOGASTRODUODENOSCOPY     MASTECTOMY, PARTIAL Left 05/09/2020   Procedure: LEFT MASECTOMY;  Surgeon: Ovidio Kin, MD;  Location: [facility] OR;  Service: General;  Laterality: Left;   MOHS SURGERY     nose   SENTINEL NODE BIOPSY Left 05/09/2020   Procedure: LEFT AXILLARY SENTINEL NODE BIOPSY;  Surgeon: Ovidio Kin, MD;  Location: [facility] OR;  Service: General;  Laterality: Left;   SKIN GRAFT Right    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 right hemicolectomy preventative surgery   Other Mother        MGUS   Alcohol abuse Father    Cirrhosis Father        deceased form cirrhosis   Hypertension Father    Hypertension Sister    Hypertension Brother    Hyperlipidemia Other    Hiatal hernia Other        several family members   Other Other        Fh of abnormal glucose   Colon cancer Neg Hx    Esophageal cancer Neg Hx    Rectal cancer Neg Hx    Colon polyps Neg Hx    Stomach cancer Neg Hx     Medications- reviewed and updated Current Outpatient Medications  Medication Sig Dispense Refill   amLODipine (NORVASC) 2.5 MG tablet TAKE 1 TABLET BY MOUTH EVERY DAY 90 tablet 2   anastrozole (ARIMIDEX) 1 MG tablet TAKE 1 TABLET BY MOUTH EVERY DAY 90 tablet 3    atorvastatin (LIPITOR) 40 MG tablet TAKE 1 TABLET BY MOUTH EVERY DAY 60 tablet 0   azelastine (ASTELIN) 0.1 % nasal spray Place 2 sprays into both nostrils 2 (two) times daily. (Patient taking differently: Place 2 sprays into both nostrils 2 (two) times daily as needed (congestion/allergies.).) 30 mL 12   citalopram (CELEXA) 20 MG tablet TAKE 1 TABLET BY MOUTH EVERY DAY 60 tablet 0   esomeprazole (NEXIUM) 40 MG capsule Take 1 capsule (40 mg total) by mouth 2 (two) times daily before a meal. 60 capsule 6   umeclidinium-vilanterol (ANORO ELLIPTA) 62.5-25 MCG/ACT AEPB Inhale 1 puff into the lungs daily.     Na Sulfate-K Sulfate-Mg Sulf 17.5-3.13-1.6 GM/177ML SOLN TAKE 1 KIT BY MOUTH ONCE FOR 1 DOSE AS DIRECTED     umeclidinium-vilanterol (ANORO ELLIPTA) 62.5-25 MCG/ACT AEPB Inhale 1 puff into the lungs daily. 90 each 3   No current facility-administered medications for this visit.    Allergies-reviewed and updated Allergies  Allergen Reactions   Penicillins Other (See Comments)    whelps, ,skin hot to touch   Reclast [Zoledronic Acid]     Severe diffuse muscle aches- hard to move    Social History   Social History Narrative   Married over 30 years. Sons 29,35 in Jun 14, 2016. 2 grandkis- 1 grandaughter - Leandrew Koyanagi . 1 grandson. Hudson lewis. All grandkids same son      Cared for mother before death in June 15, 2019, husband alcoholic- tremendous stress. Sister stays with mom- stressor      Occupation: office admin - [employer]    Objective  Objective:  BP 132/82   Pulse 82   Temp (!) 97 F (36.1 C)   Ht 5\' 5"  (1.651 m)   Wt 218 lb 12.8 oz (99.2 kg)   SpO2 97%   BMI 36.41 kg/m  Gen: NAD, resting comfortably HEENT: Mucous membranes are moist. Oropharynx normal Neck: no thyromegaly CV: RRR no murmurs rubs or gallops Lungs: CTAB no crackles, wheeze, rhonchi Abdomen: soft/nontender/nondistended/normal bowel sounds. No rebound or guarding.  Ext: no edema Skin: warm, dry Neuro: grossly  normal, moves all extremities, PERRLA   Assessment and Plan   67 y.o. female presenting for annual visit/counseling 1. Diet/weight management-weight up 10 pounds in the last year and was up 8 pounds last year at physical. We discussed making some tweaks- she wants to hold her cards close and think about this at home- perhaps  cutting down on ice cream 2. Exercise- doing some walking . Discussed maybe doing some mild strength training such as unassisted chair sits with arms 3. Immunizations/screenings/ancillary studies-consider Shingrix at pharmacy- opts out for now , has had flu shot, declines COVID shot, declines RSV- brother developed autoimmune condition of lungs within 30 days and later had pancreatic cancer discovered  -Prevnar 20  Immunization History  Administered Date(s) Administered   Fluad Trivalent(High Dose 65+) 04/17/2023   Influenza,inj,Quad PF,6+ Mos 04/29/2013, 06/01/2015, 05/02/2016, 04/11/2019, 03/24/2020, 04/03/2021, 04/09/2022   PFIZER(Purple Top)SARS-COV-2 Vaccination 12/09/2019, 01/01/2020   Pneumococcal Polysaccharide-23 11/01/2012   Td 02/03/2007   Tdap 10/20/2017  4. Cervical cancer screening- 03/08/2020 on file which she reports is scheduled for January with GYN  5.  Bilateral breast cancer follow-up-getting mammograms regularly and on Arimidex/anastrozole for 7 years.  Status post radiation after lumpectomy on the right and mastectomy on the left. . Sees Dr. Pamelia Hoit early next year 6. Colon cancer screening - January 22 2022 with 3-year repeat planned  7. Skin cancer screening- full body exam with dermatology typically every 1-2 years- plans to schedule 8. Birth control/STD check- postmenopausal and monogamous 9. Osteoporosis screening at 8- 03/08/2020 osteoporosis based on prior treatment- we will plan on 2-3 -year repeat (she [facility] jan[facility]- that is the best choice so it can be on same machine) .  She did not tolerate Reclast in the  past-had been recommended due to anastrozole.  She does not want to use Prolia.  She did previously have some dental work so also wanted to hold off which is understandable. Not currently on treatment. Takes vitamin D on limited basis but not on calcium.  10. Smoking associated screening - Current smoker (1 PPD) -not candidate for lung cancer screening despite ongoing smoking due to cancer- but she does get CT chest with Dr. Pamelia Hoit most recently 09/03/2022  - 1 ppd- not ready to quit at this time  -check urinalysis- some increased urination but mainly looking for hematuria  Status of chronic or acute concerns   #hypertension S: medication: Amlodipine 2.5 mg Home readings #s: not checking BP Readings from Last 3 Encounters:  05/20/23 132/82  08/26/22 (!) 156/85  04/09/22 136/70  A/P: stable- continue current medicines   #hyperlipidemia # Aortic atherosclerosis S: Medication:Atorvastatin 40 mg Lab Results  Component Value Date   CHOL 157 04/03/2021   HDL 39.40 04/03/2021   LDLCALC 89 04/03/2021   LDLDIRECT 190.0 12/18/2017   TRIG 146.0 04/03/2021   CHOLHDL 4 04/03/2021  A/P: Hyperlipidemia-mild elevation last visit at 89 for LDL- hopefully improve-D update today Aortic atherosclerosis (presumed stable)- LDL goal ideally <70 - update lipids   # Anxiety S:Medication: Citalopram 20 mg. No suicidal ideation     05/20/2023    2:37 PM  GAD 7 : Generalized Anxiety Score  Nervous, Anxious, on Edge 0  Control/stop worrying 0  Worry too much - different things 0  Trouble relaxing 0  Restless 0  Easily annoyed or irritable 0  Anxiety Difficulty Not difficult at all  A/P: full remission- continue current medications    # Emphysema-on Anoro with albuterol available if needed. Well controlled with daily use but -has been helpful but worried about medicare D changes- and is expensive lately at $400 and spacing to every other day or so.   # GERD S:Medication: Nexium 40 mg twice  daily A/P: really neds this- works well- she is hoping new plan will cover well   #  Fatty liver-incidental finding on prior chest CT- discussed healthy eating and regular exercise   Recommended follow up: Return in about 6 months (around 11/17/2023) for followup or sooner if needed.Schedule b4 you leave. Future Appointments  Date Time Provider Department Center  06/10/2023  2:30 PM [facility] ANNUAL WELLNESS VISIT 1 [facility]  07/30/2023  8:50 AM Napoleon Form, MD [facility]  09/08/2023 11:45 AM Serena Croissant, MD [facility] None   Lab/Order associations: fasting   ICD-10-CM   1. Pulmonary emphysema, unspecified emphysema type (HCC)  J43.9     2. Hypertension, unspecified type  I10     3. Hyperlipidemia, unspecified hyperlipidemia type  E78.5     4. Aortic atherosclerosis (HCC)  I70.0     5. Bilateral malignant neoplasm of breast in female, unspecified estrogen receptor status, unspecified site of breast (HCC)  C50.911    C50.912     6. TOBACCO ABUSE  F17.200     7. Adjustment disorder with mixed anxiety and depressed mood  F43.23       No orders of the defined types were placed in this encounter.   Return precautions advised.  Tana Conch, MD

## 2023-05-21 ENCOUNTER — Other Ambulatory Visit: Payer: Self-pay | Admitting: Family Medicine

## 2023-05-21 ENCOUNTER — Other Ambulatory Visit: Payer: Self-pay | Admitting: Hematology and Oncology

## 2023-05-21 LAB — URINALYSIS, ROUTINE W REFLEX MICROSCOPIC
Bilirubin Urine: NEGATIVE
Hgb urine dipstick: NEGATIVE
Ketones, ur: NEGATIVE
Nitrite: NEGATIVE
RBC / HPF: NONE SEEN (ref 0–?)
Specific Gravity, Urine: 1.015 (ref 1.000–1.030)
Total Protein, Urine: NEGATIVE
Urine Glucose: NEGATIVE
Urobilinogen, UA: 0.2 (ref 0.0–1.0)
pH: 8.5 — AB (ref 5.0–8.0)

## 2023-05-21 LAB — COMPREHENSIVE METABOLIC PANEL
ALT: 12 U/L (ref 0–35)
AST: 11 U/L (ref 0–37)
Albumin: 4.3 g/dL (ref 3.5–5.2)
Alkaline Phosphatase: 147 U/L — ABNORMAL HIGH (ref 39–117)
BUN: 10 mg/dL (ref 6–23)
CO2: 26 meq/L (ref 19–32)
Calcium: 9.9 mg/dL (ref 8.4–10.5)
Chloride: 103 meq/L (ref 96–112)
Creatinine, Ser: 0.66 mg/dL (ref 0.40–1.20)
GFR: 91.63 mL/min (ref >60.00)
Glucose, Bld: 101 mg/dL — ABNORMAL HIGH (ref 70–99)
Potassium: 4.2 meq/L (ref 3.5–5.1)
Sodium: 138 meq/L (ref 135–145)
Total Bilirubin: 0.5 mg/dL (ref 0.2–1.2)
Total Protein: 7.7 g/dL (ref 6.0–8.3)

## 2023-05-21 LAB — CBC WITH DIFFERENTIAL/PLATELET
Basophils Absolute: 0.1 10*3/uL (ref 0.0–0.1)
Basophils Relative: 1.2 % (ref 0.0–3.0)
Eosinophils Absolute: 0.1 10*3/uL (ref 0.0–0.7)
Eosinophils Relative: 1.3 % (ref 0.0–5.0)
HCT: 43.9 % (ref 36.0–46.0)
Hemoglobin: 14.3 g/dL (ref 12.0–15.0)
Lymphocytes Relative: 16.7 % (ref 12.0–46.0)
Lymphs Abs: 1.8 10*3/uL (ref 0.7–4.0)
MCHC: 32.6 g/dL (ref 30.0–36.0)
MCV: 90.6 fL (ref 78.0–100.0)
Monocytes Absolute: 0.7 10*3/uL (ref 0.1–1.0)
Monocytes Relative: 6 % (ref 3.0–12.0)
Neutro Abs: 8.3 10*3/uL — ABNORMAL HIGH (ref 1.4–7.7)
Neutrophils Relative %: 74.8 % (ref 43.0–77.0)
Platelets: 250 10*3/uL (ref 150.0–400.0)
RBC: 4.84 Mil/uL (ref 3.87–5.11)
RDW: 14.6 % (ref 11.5–15.5)
WBC: 11 10*3/uL — ABNORMAL HIGH (ref 4.0–10.5)

## 2023-05-21 LAB — LIPID PANEL
Cholesterol: 169 mg/dL (ref 0–200)
HDL: 43.7 mg/dL (ref >39.00)
LDL Cholesterol: 91 mg/dL (ref 0–99)
NonHDL: 125.36
Total CHOL/HDL Ratio: 4
Triglycerides: 170 mg/dL — ABNORMAL HIGH (ref 0.0–149.0)
VLDL: 34 mg/dL (ref 0.0–40.0)

## 2023-05-21 LAB — VITAMIN B12: Vitamin B-12: 317 pg/mL (ref 211–911)

## 2023-05-21 LAB — VITAMIN D 25 HYDROXY (VIT D DEFICIENCY, FRACTURES): VITD: 17.74 ng/mL — ABNORMAL LOW (ref 30.00–100.00)

## 2023-05-21 LAB — HEMOGLOBIN A1C: Hgb A1c MFr Bld: 6.1 % (ref 4.6–6.5)

## 2023-05-21 MED ORDER — VITAMIN D (ERGOCALCIFEROL) 1.25 MG (50000 UNIT) PO CAPS: 50000.0000 [IU] | ORAL_CAPSULE | ORAL | 1 refills | Status: DC

## 2023-06-03 ENCOUNTER — Other Ambulatory Visit: Payer: Self-pay | Admitting: Family Medicine

## 2023-06-03 DIAGNOSIS — F4323 Adjustment disorder with mixed anxiety and depressed mood: Secondary | ICD-10-CM

## 2023-06-10 ENCOUNTER — Ambulatory Visit (INDEPENDENT_AMBULATORY_CARE_PROVIDER_SITE_OTHER): Payer: Medicare Other

## 2023-06-10 VITALS — Wt 218.0 lb

## 2023-06-10 DIAGNOSIS — Z0000 Encounter for general adult medical examination without abnormal findings: Secondary | ICD-10-CM

## 2023-06-10 NOTE — Progress Notes (Signed)
Subjective:   [name] [name] is a 67 y.o. female who presents for an Initial Medicare Annual Wellness Visit.  Visit Complete: Virtual I connected with  [facility] on 06/10/23 by a audio enabled telemedicine application and verified that I am speaking with the correct person using two identifiers.  Patient Location: Home  Provider Location: Office/Clinic  I discussed the limitations of evaluation and management by telemedicine. The patient expressed understanding and agreed to proceed.  Vital Signs: Because this visit was a virtual/telehealth visit, some criteria may be missing or patient reported. Any vitals not documented were not able to be obtained and vitals that have been documented are patient reported.  Cardiac Risk Factors include: advanced age (>35men, >64 women);dyslipidemia;hypertension;obesity (BMI >30kg/m2)     Objective:    Today's Vitals   06/10/23 1435  Weight: 218 lb (98.9 kg)   Body mass index is 36.28 kg/m.     06/10/2023    2:41 PM 06/21/2020    1:52 PM 05/05/2020   10:24 AM 04/04/2020    9:54 AM 04/03/2020   10:10 AM 07/10/2016    3:42 PM 10/31/2012    3:43 PM  Advanced Directives  Does Patient Have a Medical Advance Directive? No No No No No No Patient does not have advance directive  Would patient like information on creating a medical advance directive? No - Patient declined  No - Patient declined No - Patient declined No - Patient declined    Pre-existing out of facility DNR order (yellow form or pink MOST form)       No    Current Medications (verified) Outpatient Encounter Medications as of 06/10/2023  Medication Sig   amLODipine (NORVASC) 2.5 MG tablet TAKE 1 TABLET BY MOUTH EVERY DAY   anastrozole (ARIMIDEX) 1 MG tablet TAKE 1 TABLET BY MOUTH EVERY DAY   ANORO ELLIPTA 62.5-25 MCG/ACT AEPB TAKE 1 PUFF BY MOUTH EVERY DAY   atorvastatin (LIPITOR) 40 MG tablet TAKE 1 TABLET BY MOUTH EVERY DAY   citalopram (CELEXA) 20 MG tablet TAKE 1 TABLET BY  MOUTH EVERY DAY   esomeprazole (NEXIUM) 40 MG capsule Take 1 capsule (40 mg total) by mouth 2 (two) times daily before a meal.   Vitamin D, Ergocalciferol, (DRISDOL) 1.25 MG (50000 UNIT) CAPS capsule Take 1 capsule (50,000 Units total) by mouth every 7 (seven) days.   [DISCONTINUED] promethazine (PHENERGAN) 25 MG tablet Take 1 tablet (25 mg total) by mouth every 6 (six) hours as needed for nausea or vomiting.   No facility-administered encounter medications on file as of 06/10/2023.    Allergies (verified) Penicillins and Reclast [zoledronic acid]   History: Past Medical History:  Diagnosis Date   Abnormal glucose    Allergy    Anxiety    05/05/20   Arthritis    Breast cancer (HCC) 03/2020   COPD (chronic obstructive pulmonary disease) (HCC)    Esophageal stricture    Dr.Dora Juanda Chance   GERD (gastroesophageal reflux disease)    History of colonic polyps    Hyperlipidemia    Hypertension    MVP (mitral valve prolapse)    history of    Osteoporosis    Personal history of radiation therapy    completed 08-21-2020 done bilaterally  no chemo   Tobacco abuse    tried Chantix- caused agression   Past Surgical History:  Procedure Laterality Date   APPENDECTOMY  2008   AXILLARY SENTINEL NODE BIOPSY Right 05/09/2020   Procedure: RIGHT AXILLARY SENTINEL  NODE BIOPSY;  Surgeon: Ovidio Kin, MD;  Location: [facility] OR;  Service: General;  Laterality: Right;   BREAST BIOPSY  2000   BREAST BIOPSY Bilateral 06-19-20   BREAST LUMPECTOMY     BREAST LUMPECTOMY WITH RADIOACTIVE SEED LOCALIZATION Right 05/09/2020   Procedure: RIGHT BREAST LUMPECTOMY WITH RADIOACTIVE SEED;  Surgeon: Ovidio Kin, MD;  Location: [facility] OR;  Service: General;  Laterality: Right;   COLONOSCOPY     ESOPHAGOGASTRODUODENOSCOPY     MASTECTOMY, PARTIAL Left 05/09/2020   Procedure: LEFT MASECTOMY;  Surgeon: Ovidio Kin, MD;  Location: [facility] OR;  Service: General;  Laterality: Left;   MOHS SURGERY     nose   SENTINEL NODE BIOPSY  Left 05/09/2020   Procedure: LEFT AXILLARY SENTINEL NODE BIOPSY;  Surgeon: Ovidio Kin, MD;  Location: [facility] OR;  Service: General;  Laterality: Left;   SKIN GRAFT Right    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 right hemicolectomy preventative surgery   Other Mother        MGUS   Alcohol abuse Father    Cirrhosis Father        deceased form cirrhosis   Hypertension Father    Hypertension Sister    Hypertension Brother    Hyperlipidemia Other    Hiatal hernia Other        several family members   Other Other        Fh of abnormal glucose   Colon cancer Neg Hx    Esophageal cancer Neg Hx    Rectal cancer Neg Hx    Colon polyps Neg Hx    Stomach cancer Neg Hx    Social History   Socioeconomic History   Marital status: Married    Spouse name: Not on file   Number of children: 2   Years of education: Not on file   Highest education level: Not on file  Occupational History   Occupation: administrative assitant   Tobacco Use   Smoking status: Every Day    Current packs/day: 1.00    Average packs/day: 1 pack/day for 44.9 years (44.9 ttl pk-yrs)    Types: Cigarettes    Start date: 07/15/1978   Smokeless tobacco: Never  Vaping Use   Vaping status: Never Used  Substance and Sexual Activity   Alcohol use: No    Alcohol/week: 0.0 standard drinks of alcohol   Drug use: No   Sexual activity: Not Currently  Other Topics Concern   Not on file  Social History Narrative   Married over 30 years. Sons 29,35 in 06-19-2016. 2 grandkis- 1 grandaughter - Leandrew Koyanagi . 1 grandson. Hudson lewis. All grandkids same son      Cared for mother before death in 2019/06/20, husband alcoholic- tremendous stress. Sister stays with mom- stressor      Occupation: office admin - [employer]    Social Determinants of Health   Financial Resource Strain: Low  Risk  (06/10/2023)   Overall Financial Resource Strain (CARDIA)    Difficulty of Paying Living Expenses: Not hard at all  Food Insecurity: No Food Insecurity (06/10/2023)   Hunger Vital Sign    Worried About Running Out of Food in the Last Year: Never true    Ran Out of Food in the Last Year: Never  true  Transportation Needs: No Transportation Needs (06/10/2023)   PRAPARE - Administrator, Civil Service (Medical): No    Lack of Transportation (Non-Medical): No  Physical Activity: Insufficiently Active (06/10/2023)   Exercise Vital Sign    Days of Exercise per Week: 5 days    Minutes of Exercise per Session: 10 min  Stress: No Stress Concern Present (06/10/2023)   [employer] of Occupational Health - Occupational Stress Questionnaire    Feeling of Stress : Not at all  Social Connections: Moderately Isolated (06/10/2023)   Social Connection and Isolation Panel [NHANES]    Frequency of Communication with Friends and Family: More than three times a week    Frequency of Social Gatherings with Friends and Family: Twice a week    Attends Religious Services: Never    Database administrator or Organizations: No    Attends Engineer, structural: Never    Marital Status: Married    Tobacco Counseling Ready to quit: Not Answered Counseling given: Not Answered   Clinical Intake:  Pre-visit preparation completed: Yes  Pain : No/denies pain     BMI - recorded: 36.28 Nutritional Status: BMI > 30  Obese Nutritional Risks: None Diabetes: No  How often do you need to have someone help you when you read instructions, pamphlets, or other written materials from your doctor or pharmacy?: 1 - Never  Interpreter Needed?: No  Information entered by :: Lanier Ensign, LPN   Activities of Daily Living    06/10/2023    2:37 PM  In your present state of health, do you have any difficulty performing the following activities:  Hearing? 0  Vision? 0  Difficulty  concentrating or making decisions? 0  Walking or climbing stairs? 0  Dressing or bathing? 0  Doing errands, shopping? 0  Preparing Food and eating ? N  Comment at times  Using the Toilet? N  In the past six months, have you accidently leaked urine? Y  Do you have problems with loss of bowel control? Y  Managing your Medications? N  Managing your Finances? N  Housekeeping or managing your Housekeeping? N    Patient Care Team: Shelva Majestic, MD as PCP - General (Family Medicine) Serena Croissant, MD as Consulting Physician (Hematology and Oncology) Dorothy Puffer, MD as Consulting Physician (Radiation Oncology) Ovidio Kin, MD as Consulting Physician (General Surgery)  Indicate any recent Medical Services you may have received from other than [facility] providers in the past year (date may be approximate).     Assessment:   This is a routine wellness examination for [facility].  Hearing/Vision screen Hearing Screening - Comments:: Pt denies any hearing issues  Vision Screening - Comments:: Pt follows up with [facility] for annual eye exam    Goals Addressed             This Visit's Progress    Patient Stated       Lose weight        Depression Screen    06/10/2023    2:40 PM 05/20/2023    2:37 PM 04/09/2022    2:10 PM 04/03/2021    1:24 PM 03/24/2020    3:27 PM 10/13/2018   12:24 PM 12/18/2017    3:13 PM  PHQ 2/9 Scores  PHQ - 2 Score 0 0 0 0 4 0 0  PHQ- 9 Score 0 3 0  4 0 3    Fall Risk    06/10/2023  2:42 PM 05/20/2023    2:37 PM 11/14/2015    3:47 PM 11/08/2015    4:01 PM  Fall Risk   Falls in the past year? 0 0  Yes  Number falls in past yr: 0 0  1  Injury with Fall? 0 0 No Yes  Risk for fall due to : No Fall Risks No Fall Risks    Follow up Falls prevention discussed Falls evaluation completed      MEDICARE RISK AT HOME: Medicare Risk at Home Any stairs in or around the home?: Yes If so, are there any without handrails?: No Home free of loose throw rugs in  walkways, pet beds, electrical cords, etc?: Yes Adequate lighting in your home to reduce risk of falls?: Yes Life alert?: No Use of a cane, walker or w/c?: No Grab bars in the bathroom?: No Shower chair or bench in shower?: No Elevated toilet seat or a handicapped toilet?: No  TIMED UP AND GO:  Was the test performed? No    Cognitive Function:        06/10/2023    2:42 PM  6CIT Screen  What Year? 0 points  What month? 0 points  What time? 0 points  Count back from 20 0 points  Months in reverse 0 points  Repeat phrase 0 points  Total Score 0 points    Immunizations Immunization History  Administered Date(s) Administered   Fluad Trivalent(High Dose 65+) 04/17/2023   Influenza,inj,Quad PF,6+ Mos 04/29/2013, 06/01/2015, 05/02/2016, 04/11/2019, 03/24/2020, 04/03/2021, 04/09/2022   Influenza-Unspecified 08/17/2019, 04/28/2022   PFIZER(Purple Top)SARS-COV-2 Vaccination 12/09/2019, 01/01/2020   PNEUMOCOCCAL CONJUGATE-20 05/20/2023   Pneumococcal Polysaccharide-23 11/01/2012   Td 02/03/2007   Tdap 10/20/2017   Unspecified SARS-COV-2 Vaccination 01/13/2020    TDAP status: Up to date  Flu Vaccine status: Up to date  Pneumococcal vaccine status: Up to date  Covid-19 vaccine status: Completed vaccines  Qualifies for Shingles Vaccine? Yes   Zostavax completed No   Shingrix Completed?: No.    Education has been provided regarding the importance of this vaccine. Patient has been advised to call insurance company to determine out of pocket expense if they have not yet received this vaccine. Advised may also receive vaccine at local pharmacy or Health Dept. Verbalized acceptance and understanding.  Screening Tests Health Maintenance  Topic Date Due   DEXA SCAN  02/16/2022   Zoster Vaccines- Shingrix (1 of 2) 08/20/2023 (Originally 04/22/1975)   Hepatitis C Screening  07/18/2098 (Originally [birth year]1975)   Lung Cancer Screening  09/04/2023   Medicare Annual Wellness (AWV)   06/09/2024   MAMMOGRAM  01/21/2025   Colonoscopy  01/22/2025   DTaP/Tdap/Td (3 - Td or Tdap) 10/21/2027   Pneumonia Vaccine 77+ Years old  Completed   INFLUENZA VACCINE  Completed   COVID-19 Vaccine  Completed   HPV VACCINES  Aged Out    Health Maintenance  Health Maintenance Due  Topic Date Due   DEXA SCAN  02/16/2022    Colorectal cancer screening: Type of screening: Colonoscopy. Completed 01/22/22. Repeat every 3 years  Mammogram status: Completed 01/22/23. Repeat every year  Pt stated bone density will be completed next year with mammogram   Lung Cancer Screening: (Low Dose CT Chest recommended if Age 19-80 years, 20 pack-year currently smoking OR have quit w/in 15years.) does qualify.   Lung Cancer Screening Referral: completed 09/03/22  Additional Screening:  Hepatitis C Screening: does qualify;  Vision Screening: Recommended annual ophthalmology exams for early detection of glaucoma and  other disorders of the eye. Is the patient up to date with their annual eye exam?  Yes  Who is the provider or what is the name of the office in which the patient attends annual eye exams? [facility]  If pt is not established with a provider, would they like to be referred to a provider to establish care? No .   Dental Screening: Recommended annual dental exams for proper oral hygiene  Community Resource Referral / Chronic Care Management: CRR required this visit?  No   CCM required this visit?  No     Plan:     I have personally reviewed and noted the following in the patient's chart:   Medical and social history Use of alcohol, tobacco or illicit drugs  Current medications and supplements including opioid prescriptions. Patient is not currently taking opioid prescriptions. Functional ability and status Nutritional status Physical activity Advanced directives List of other physicians Hospitalizations, surgeries, and ER visits in previous 12 months Vitals Screenings to  include cognitive, depression, and falls Referrals and appointments  In addition, I have reviewed and discussed with patient certain preventive protocols, quality metrics, and best practice recommendations. A written personalized care plan for preventive services as well as general preventive health recommendations were provided to patient.     Marzella Schlein, LPN   16/04/[id number]   After Visit Summary: (MyChart) Due to this being a telephonic visit, the after visit summary with patients personalized plan was offered to patient via MyChart   Nurse Notes: none

## 2023-06-10 NOTE — Patient Instructions (Signed)
[name] [name] , Thank you for taking time to come for your Medicare Wellness Visit. I appreciate your ongoing commitment to your health goals. Please review the following plan we discussed and let me know if I can assist you in the future.   Referrals/Orders/Follow-Ups/Clinician Recommendations: Aim for 30 minutes of exercise or brisk walking, 6-8 glasses of water, and 5 servings of fruits and vegetables each day. Continue working on losing weight   This is a list of the screening recommended for you and due dates:  Health Maintenance  Topic Date Due   DEXA scan (bone density measurement)  02/16/2022   Zoster (Shingles) Vaccine (1 of 2) 08/20/2023*   Hepatitis C Screening  07/18/2098*   Screening for Lung Cancer  09/04/2023   Medicare Annual Wellness Visit  06/09/2024   Mammogram  01/21/2025   Colon Cancer Screening  01/22/2025   DTaP/Tdap/Td vaccine (3 - Td or Tdap) 10/21/2027   Pneumonia Vaccine  Completed   Flu Shot  Completed   COVID-19 Vaccine  Completed   HPV Vaccine  Aged Out  *Topic was postponed. The date shown is not the original due date.    Advanced directives: (Declined) Advance directive discussed with you today. Even though you declined this today, please call our office should you change your mind, and we can give you the proper paperwork for you to fill out.  Next Medicare Annual Wellness Visit scheduled for next year: Yes

## 2023-06-23 LAB — HM DEXA SCAN

## 2023-07-14 ENCOUNTER — Ambulatory Visit: Payer: Medicare Other | Admitting: Family Medicine

## 2023-07-30 ENCOUNTER — Other Ambulatory Visit: Payer: Self-pay | Admitting: Family Medicine

## 2023-07-30 ENCOUNTER — Encounter: Payer: Self-pay | Admitting: Gastroenterology

## 2023-07-30 ENCOUNTER — Ambulatory Visit: Payer: Medicare Other | Admitting: Gastroenterology

## 2023-07-30 ENCOUNTER — Ambulatory Visit (INDEPENDENT_AMBULATORY_CARE_PROVIDER_SITE_OTHER): Payer: Medicare Other | Admitting: Gastroenterology

## 2023-07-30 VITALS — BP 130/78 | HR 82 | Ht 65.0 in | Wt 219.0 lb

## 2023-07-30 DIAGNOSIS — Z853 Personal history of malignant neoplasm of breast: Secondary | ICD-10-CM | POA: Diagnosis not present

## 2023-07-30 DIAGNOSIS — J449 Chronic obstructive pulmonary disease, unspecified: Secondary | ICD-10-CM | POA: Diagnosis not present

## 2023-07-30 DIAGNOSIS — F17210 Nicotine dependence, cigarettes, uncomplicated: Secondary | ICD-10-CM | POA: Diagnosis not present

## 2023-07-30 DIAGNOSIS — K2100 Gastro-esophageal reflux disease with esophagitis, without bleeding: Secondary | ICD-10-CM

## 2023-07-30 MED ORDER — FAMOTIDINE 20 MG PO TABS: 20.0000 mg | ORAL_TABLET | Freq: Every day | ORAL | 3 refills | Status: DC

## 2023-07-30 NOTE — Patient Instructions (Addendum)
 VISIT SUMMARY:  [name] visited today with concerns about choking and phlegm production, which have improved since starting Nexium . She has switched to the generic version, esomeprazole , due to financial constraints. She also discussed her history of smoking, COPD, breast cancer, and a painful sensation in her chest area. We reviewed her current medications and discussed plans for further evaluation and management of her conditions.  YOUR PLAN:  -GASTROESOPHAGEAL REFLUX DISEASE (GERD): GERD is a condition where stomach acid frequently flows back into the esophagus, causing irritation. Continue taking Nexium  as needed and add Pepcid  in the evening. We will schedule an upper endoscopy to check for esophagitis or Barrett's esophagus.  -COPD AND SMOKING: COPD is a chronic lung disease often caused by smoking, leading to breathing difficulties. It is important to quit smoking to manage COPD. We discussed potential strategies, including using lower dose nicotine patches.  -BREAST PAIN: The pain in your chest area may be related to scar tissue or effects from previous radiation treatment. Try using Salonpas (lidocaine  patches) for pain relief. We will discuss with your oncologist the possibility of further imaging to evaluate the cause of the pain.  -GENERAL HEALTH MAINTENANCE: We recommend scheduling an endoscopy for February 19th, 2025, and getting the shingles vaccine to maintain your overall health.  INSTRUCTIONS:  Please continue taking Nexium  as needed and add Pepcid  in the evening. Schedule your endoscopy for February 19th, 2025.  Consider using lower dose nicotine patches to help quit smoking. Try Salonpas (lidocaine  patches) for chest pain relief and discuss further imaging with your oncologist. Also, get the shingles vaccine as recommended.  Due to recent changes in healthcare laws, you may see the results of your imaging and laboratory studies on MyChart before your provider has had a chance  to review them.  We understand that in some cases there may be results that are confusing or concerning to you. Not all laboratory results come back in the same time frame and the provider may be waiting for multiple results in order to interpret others.  Please give us  48 hours in order for your provider to thoroughly review all the results before contacting the office for clarification of your results.    You have been scheduled for an endoscopy. Please follow written instructions given to you at your visit today.  If you use inhalers (even only as needed), please bring them with you on the day of your procedure.  If you take any of the following medications, they will need to be adjusted prior to your procedure:   DO NOT TAKE 7 DAYS PRIOR TO TEST- Trulicity (dulaglutide) Ozempic, Wegovy (semaglutide) Mounjaro (tirzepatide) Bydureon Bcise (exanatide extended release)  DO NOT TAKE 1 DAY PRIOR TO YOUR TEST Rybelsus (semaglutide) Adlyxin (lixisenatide) Victoza (liraglutide) Byetta (exanatide) ___________________________________________________________________________     I appreciate the  opportunity to care for you  Thank You   Kavitha Nandigam , MD

## 2023-07-30 NOTE — Progress Notes (Addendum)
[facility]    [id number]    19-Sep-1955  Primary Care Physician:Hunter, Aldine Contes, MD  Referring Physician: Shelva Majestic, MD [address] [place],  [place] [address]   Chief complaint:  GERD, globus  Discussed the use of AI scribe software for clinical note transcription with the patient, who gave verbal consent to proceed.  History of Present Illness   68 yr old very pleasant patient with a long-standing history of acid reflux, presents with a primary concern of choking and phlegm production. The patient reports that these symptoms have significantly improved since starting Nexium, a proton pump inhibitor. However, the patient has recently switched to the generic version, esomeprazole, due to financial constraints related to retirement and changes in insurance coverage. The patient reports adjusting well to the generic medication, taking two doses when possible, but sometimes only taking one.  The patient has a history of smoking, currently consuming about a pack a day for many years, and has been diagnosed with COPD. The patient reports nightly coughing, which she attributes to post-nasal drainage and lying down, suggesting possible nocturnal acid reflux. The patient also reports a history of choking episodes, which have not occurred in years.  The patient has a history of breast cancer, having undergone a mastectomy on the left side and a lumpectomy on the right side, followed by radiation therapy. The patient reports a painful sensation in the chest area, which is exacerbated by certain movements. The pain is described as a catching sensation, suggesting possible musculoskeletal or neuropathic pain, possibly related to scar tissue from radiation therapy or the presence of surgical markers. The patient reports no recent imaging or diagnostic tests to investigate this pain.  The patient also reports a history of a narrowing in the upper part of the esophagus,  discovered during an endoscopy in 2008. Given the patient's long history of acid reflux and the potential for Barrett's esophagus, a precancerous condition, the patient is considering another endoscopy. The patient expresses anxiety about the possibility of a cancer diagnosis, having previously been diagnosed with bilateral cancer.          Outpatient Encounter Medications as of 07/30/2023  Medication Sig   amLODipine (NORVASC) 2.5 MG tablet TAKE 1 TABLET BY MOUTH EVERY DAY   anastrozole (ARIMIDEX) 1 MG tablet TAKE 1 TABLET BY MOUTH EVERY DAY   ANORO ELLIPTA 62.5-25 MCG/ACT AEPB TAKE 1 PUFF BY MOUTH EVERY DAY   atorvastatin (LIPITOR) 40 MG tablet TAKE 1 TABLET BY MOUTH EVERY DAY   citalopram (CELEXA) 20 MG tablet TAKE 1 TABLET BY MOUTH EVERY DAY   esomeprazole (NEXIUM) 40 MG capsule Take 1 capsule (40 mg total) by mouth 2 (two) times daily before a meal.   famotidine (PEPCID) 20 MG tablet Take 1 tablet (20 mg total) by mouth at bedtime.   Vitamin D, Ergocalciferol, (DRISDOL) 1.25 MG (50000 UNIT) CAPS capsule Take 1 capsule (50,000 Units total) by mouth every 7 (seven) days.   [DISCONTINUED] promethazine (PHENERGAN) 25 MG tablet Take 1 tablet (25 mg total) by mouth every 6 (six) hours as needed for nausea or vomiting.   No facility-administered encounter medications on file as of 07/30/2023.    Allergies as of 07/30/2023 - Review Complete 07/30/2023  Allergen Reaction Noted   Penicillins Other (See Comments) 08/01/2008   Reclast [zoledronic acid]  04/09/2022    Past Medical History:  Diagnosis Date   Abnormal glucose    Allergy  Anxiety    05/05/20   Arthritis    Breast cancer (HCC) 03/2020   COPD (chronic obstructive pulmonary disease) (HCC)    Esophageal stricture    Dr.Dora Juanda Chance   GERD (gastroesophageal reflux disease)    History of colonic polyps    Hyperlipidemia    Hypertension    MVP (mitral valve prolapse)    history of    Osteoporosis    Personal history of  radiation therapy    completed 08-21-2020 done bilaterally  no chemo   Tobacco abuse    tried Chantix- caused agression    Past Surgical History:  Procedure Laterality Date   APPENDECTOMY  2008   AXILLARY SENTINEL NODE BIOPSY Right 05/09/2020   Procedure: RIGHT AXILLARY SENTINEL NODE BIOPSY;  Surgeon: Ovidio Kin, MD;  Location: [facility] OR;  Service: General;  Laterality: Right;   BREAST BIOPSY  2000   BREAST BIOPSY Bilateral 2021   BREAST LUMPECTOMY     BREAST LUMPECTOMY WITH RADIOACTIVE SEED LOCALIZATION Right 05/09/2020   Procedure: RIGHT BREAST LUMPECTOMY WITH RADIOACTIVE SEED;  Surgeon: Ovidio Kin, MD;  Location: [facility] OR;  Service: General;  Laterality: Right;   COLONOSCOPY     ESOPHAGOGASTRODUODENOSCOPY     MASTECTOMY, PARTIAL Left 05/09/2020   Procedure: LEFT MASECTOMY;  Surgeon: Ovidio Kin, MD;  Location: [facility] OR;  Service: General;  Laterality: Left;   MOHS SURGERY     nose   SENTINEL NODE BIOPSY Left 05/09/2020   Procedure: LEFT AXILLARY SENTINEL NODE BIOPSY;  Surgeon: Ovidio Kin, MD;  Location: [facility] OR;  Service: General;  Laterality: Left;   SKIN GRAFT Right    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 right hemicolectomy preventative surgery   Other Mother        MGUS   Alcohol abuse Father    Cirrhosis Father        deceased form cirrhosis   Hypertension Father    Hypertension Sister    Hypertension Brother    Hyperlipidemia Other    Hiatal hernia Other        several family members   Other Other        Fh of abnormal glucose   Colon cancer Neg Hx    Esophageal cancer Neg Hx    Rectal cancer Neg Hx    Colon polyps Neg Hx    Stomach cancer Neg Hx     Social History   Socioeconomic History   Marital status: Married    Spouse name: Not on file   Number of children: 2   Years of education: Not on file    Highest education level: Not on file  Occupational History   Occupation: administrative assitant   Tobacco Use   Smoking status: Every Day    Current packs/day: 1.00    Average packs/day: 1 pack/day for 45.0 years (45.0 ttl pk-yrs)    Types: Cigarettes    Start date: 07/15/1978   Smokeless tobacco: Never  Vaping Use   Vaping status: Never Used  Substance and Sexual Activity   Alcohol use: No    Alcohol/week: 0.0 standard drinks of alcohol   Drug use: No   Sexual activity: Not Currently  Other Topics Concern   Not on file  Social History Narrative  Married over 30 years. Sons 29,35 in 08-08-2015. 2 grandkis- 1 grandaughter - Leandrew Koyanagi . 1 grandson. Hudson lewis. All grandkids same son      Cared for mother before death in Aug 07, 2018, husband alcoholic- tremendous stress. Sister stays with mom- stressor      Occupation: office admin - [employer]    Social Drivers of Health   Financial Resource Strain: Low Risk  (06/10/2023)   Overall Financial Resource Strain (CARDIA)    Difficulty of Paying Living Expenses: Not hard at all  Food Insecurity: No Food Insecurity (06/10/2023)   Hunger Vital Sign    Worried About Running Out of Food in the Last Year: Never true    Ran Out of Food in the Last Year: Never true  Transportation Needs: No Transportation Needs (06/10/2023)   PRAPARE - Administrator, Civil Service (Medical): No    Lack of Transportation (Non-Medical): No  Physical Activity: Insufficiently Active (06/10/2023)   Exercise Vital Sign    Days of Exercise per Week: 5 days    Minutes of Exercise per Session: 10 min  Stress: No Stress Concern Present (06/10/2023)   [employer] of Occupational Health - Occupational Stress Questionnaire    Feeling of Stress : Not at all  Social Connections: Moderately Isolated (06/10/2023)   Social Connection and Isolation Panel [NHANES]    Frequency of Communication with Friends and Family: More than three times a week     Frequency of Social Gatherings with Friends and Family: Twice a week    Attends Religious Services: Never    Database administrator or Organizations: No    Attends Banker Meetings: Never    Marital Status: Married  Catering manager Violence: Not At Risk (06/10/2023)   Humiliation, Afraid, Rape, and Kick questionnaire    Fear of Current or Ex-Partner: No    Emotionally Abused: No    Physically Abused: No    Sexually Abused: No      Review of systems: All other review of systems negative except as mentioned in the HPI.   Physical Exam: Vitals:   07/30/23 0950  BP: 130/78  Pulse: 82  SpO2: 96%   Body mass index is 36.44 kg/m. Gen:      No acute distress HEENT:  sclera anicteric CV: s1s2 rrr, no murmur Lungs: B/l clear. Abd:      soft, non-tender; no palpable masses, no distension Ext:    No edema Neuro: alert and oriented x 3 Psych: normal mood and affect  Data Reviewed:  Reviewed labs, radiology imaging, old records and pertinent past GI work up     Assessment and Plan    Gastroesophageal Reflux Disease (GERD) Improved symptoms with Nexium, but patient has difficulty affording the medication. Patient has a history of choking episodes, but none in recent years. Last endoscopy was in August 07, 2006, and patient has had long-term acid reflux and has greater than 40-pack-year smoking history. -Continue Nexium 40 mg daily, add Pepcid 20 20 mg in the evening. -Plan for upper endoscopy to check for esophagitis or Barrett's esophagus screening given longstanding history of GERD and > 40-pack-year smoking history.  COPD and Smoking Patient reports smoking a pack a day for many years and has a diagnosis of COPD. Patient experiences coughing at night, which may be exacerbated by GERD. -Encouraged patient to quit smoking, discussed potential strategies including lower dose nicotine patches.  Breast Pain Patient reports pain in the area of a previous mastectomy and  radiation treatment. Pain is worse with certain movements and may be related to scar tissue or radiation effects. -Recommend trying Salonpas (lidocaine patches) for pain relief. -Plan to discuss with oncologist for potential further imaging (CT scan or MRI) to evaluate cause of pain.  General Health Maintenance -Schedule endoscopy for February 19th, 2025. -Recommended patient to get shingles vaccine.     The patient was provided an opportunity to ask questions and all were answered. The patient agreed with the plan and demonstrated an understanding of the instructions.  Iona Beard , MD    CC: Shelva Majestic, MD

## 2023-07-31 ENCOUNTER — Encounter: Payer: Self-pay | Admitting: Gastroenterology

## 2023-07-31 NOTE — Addendum Note (Signed)
Addended by: Napoleon Form on: 07/31/2023 05:22 PM   Modules accepted: Level of Service

## 2023-08-11 ENCOUNTER — Other Ambulatory Visit: Payer: Self-pay | Admitting: Family Medicine

## 2023-08-11 NOTE — Telephone Encounter (Signed)
Copied from CRM [id number]. Topic: Clinical - Medication Refill >> Aug 11, 2023  5:09 PM Denese Killings wrote: Most Recent Primary Care Visit:  Provider: Marzella Schlein  Department: [facility]  Visit Type: MEDICARE AWV, INITIAL  Date: 06/10/2023  Medication: atorvastatin (LIPITOR) 40 MG tablet *All future refills for medications need to go to [facility] on [address]  Has the patient contacted their pharmacy? Yes (Agent: If no, request that the patient contact the pharmacy for the refill. If patient does not wish to contact the pharmacy document the reason why and proceed with request.) (Agent: If yes, when and what did the pharmacy advise?) Advised to call pharmacy   Is this the correct pharmacy for this prescription? Yes If no, delete pharmacy and type the correct one.  This is the patient's preferred pharmacy:   [id number] - [place], [place] - [address] AT [facility] OF [address] [place] [address] Phone: [phone] Fax: [phone]   Has the prescription been filled recently? No  Is the patient out of the medication? No  Has the patient been seen for an appointment in the last year OR does the patient have an upcoming appointment? Yes  Can we respond through MyChart? Yes  Agent: Please be advised that Rx refills may take up to 3 business days. We ask that you follow-up with your pharmacy.

## 2023-08-11 NOTE — Telephone Encounter (Signed)
Last Fill: 07/30/23  Last OV: 05/20/23 Next OV: 11/17/23  Routing to provider for review/authorization.

## 2023-09-03 ENCOUNTER — Encounter: Payer: Self-pay | Admitting: Gastroenterology

## 2023-09-03 ENCOUNTER — Ambulatory Visit: Payer: Medicare Other | Admitting: Gastroenterology

## 2023-09-03 VITALS — BP 130/52 | HR 75 | Temp 97.3°F | Resp 16 | Ht 65.0 in | Wt 219.0 lb

## 2023-09-03 DIAGNOSIS — K3189 Other diseases of stomach and duodenum: Secondary | ICD-10-CM | POA: Diagnosis not present

## 2023-09-03 DIAGNOSIS — K31A19 Gastric intestinal metaplasia without dysplasia, unspecified site: Secondary | ICD-10-CM

## 2023-09-03 DIAGNOSIS — K2970 Gastritis, unspecified, without bleeding: Secondary | ICD-10-CM

## 2023-09-03 DIAGNOSIS — K2100 Gastro-esophageal reflux disease with esophagitis, without bleeding: Secondary | ICD-10-CM

## 2023-09-03 MED ORDER — SODIUM CHLORIDE 0.9 % IV SOLN: 500.0000 mL | Freq: Once | INTRAVENOUS | Status: DC

## 2023-09-03 NOTE — Progress Notes (Unsigned)
 Pt's states no medical or surgical changes since previsit or office visit.

## 2023-09-03 NOTE — Progress Notes (Unsigned)
 Vss nad trans to pacu

## 2023-09-03 NOTE — Op Note (Signed)
 [facility] Patient Name: [name] [name] Procedure Date: 09/03/2023 9:48 AM MRN: [id number] Endoscopist: Napoleon Form , MD, [id number] Age: 68 Referring MD:  Date of Birth: 06-11-1956 Gender: Female Account #: [id number] Procedure:                Upper GI endoscopy Indications:              Esophageal reflux symptoms that persist despite                            appropriate therapy Medicines:                Monitored Anesthesia Care Procedure:                Pre-Anesthesia Assessment:                           - Prior to the procedure, a History and Physical                            was performed, and patient medications and                            allergies were reviewed. [name] tolerance of                            previous anesthesia was also reviewed. The risks                            and benefits of the procedure and the sedation                            options and risks were discussed with the patient.                            All questions were answered, and informed consent                            was obtained. Prior Anticoagulants: The patient has                            taken no anticoagulant or antiplatelet agents. ASA                            Grade Assessment: II - A patient with mild systemic                            disease. After reviewing the risks and benefits,                            the patient was deemed in satisfactory condition to                            undergo the procedure.  After obtaining informed consent, the endoscope was                            passed under direct vision. Throughout the                            procedure, the patient's blood pressure, pulse, and                            oxygen saturations were monitored continuously. The                            Olympus scope [id number] was introduced through the                            mouth, and advanced to the second  part of duodenum.                            The upper GI endoscopy was accomplished without                            difficulty. The patient tolerated the procedure                            well. Scope In: Scope Out: Findings:                 The Z-line was regular and was found 36 cm from the                            incisors.                           No gross lesions were noted in the entire esophagus.                           The stomach was normal.                           The cardia and gastric fundus were normal on                            retroflexion.                           Diffuse nodular erythematous mucosa was found in                            the first portion of the duodenum and in the second                            portion of the duodenum. Biopsies were taken with a                            cold forceps for histology. Complications:  No immediate complications. Estimated Blood Loss:     Estimated blood loss was minimal. Impression:               - Z-line regular, 36 cm from the incisors.                           - No gross lesions in the entire esophagus.                           - Normal stomach.                           - Nodular mucosa in the second portion of the                            duodenum. Biopsied. Recommendation:           - Resume previous diet.                           - Continue present medications.                           - Await pathology results.                           - Follow an antireflux regimen. Napoleon Form, MD 09/03/2023 10:20:38 AM This report has been signed electronically.

## 2023-09-03 NOTE — Progress Notes (Unsigned)
 [facility] History and Physical   Primary Care Physician:  Shelva Majestic, MD   Reason for Procedure:  GERD persistent despite treatment  Plan:    EGD and colonoscopy with possible interventions as needed     HPI: [name] [name] is a very pleasant 68 y.o. female here for EGD for evaluation of persistent GERD despite treatment.   The risks and benefits as well as alternatives of endoscopic procedure(s) have been discussed and reviewed. All questions answered. The patient agrees to proceed.    Past Medical History:  Diagnosis Date   Abnormal glucose    Allergy    Anxiety    05/05/20   Arthritis    Breast cancer (HCC) 03/2020   COPD (chronic obstructive pulmonary disease) (HCC)    Esophageal stricture    Dr.Dora Juanda Chance   GERD (gastroesophageal reflux disease)    History of colonic polyps    Hyperlipidemia    Hypertension    MVP (mitral valve prolapse)    history of    Osteoporosis    Personal history of radiation therapy    completed 08-21-2020 done bilaterally  no chemo   Tobacco abuse    tried Chantix- caused agression    Past Surgical History:  Procedure Laterality Date   APPENDECTOMY  2008   AXILLARY SENTINEL NODE BIOPSY Right 05/09/2020   Procedure: RIGHT AXILLARY SENTINEL NODE BIOPSY;  Surgeon: Ovidio Kin, MD;  Location: [facility] OR;  Service: General;  Laterality: Right;   BREAST BIOPSY  2000   BREAST BIOPSY Bilateral 2021   BREAST LUMPECTOMY     BREAST LUMPECTOMY WITH RADIOACTIVE SEED LOCALIZATION Right 05/09/2020   Procedure: RIGHT BREAST LUMPECTOMY WITH RADIOACTIVE SEED;  Surgeon: Ovidio Kin, MD;  Location: [facility] OR;  Service: General;  Laterality: Right;   COLONOSCOPY     ESOPHAGOGASTRODUODENOSCOPY     MASTECTOMY, PARTIAL Left 05/09/2020   Procedure: LEFT MASECTOMY;  Surgeon: Ovidio Kin, MD;  Location: [facility] OR;  Service: General;  Laterality: Left;   MOHS SURGERY     nose   SENTINEL NODE BIOPSY Left 05/09/2020   Procedure: LEFT AXILLARY  SENTINEL NODE BIOPSY;  Surgeon: Ovidio Kin, MD;  Location: [facility] OR;  Service: General;  Laterality: Left;   SKIN GRAFT Right    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 Authorizing Provider  amLODipine (NORVASC) 2.5 MG tablet TAKE 1 TABLET BY MOUTH EVERY DAY 01/14/23  Yes Shelva Majestic, MD  anastrozole (ARIMIDEX) 1 MG tablet TAKE 1 TABLET BY MOUTH EVERY DAY 05/21/23  Yes Serena Croissant, MD  atorvastatin (LIPITOR) 40 MG tablet TAKE 1 TABLET BY MOUTH EVERY DAY 07/30/23  Yes Shelva Majestic, MD  citalopram (CELEXA) 20 MG tablet TAKE 1 TABLET BY MOUTH EVERY DAY 06/03/23  Yes Shelva Majestic, MD  esomeprazole (NEXIUM) 40 MG capsule Take 1 capsule (40 mg total) by mouth 2 (two) times daily before a meal. 04/10/23  Yes Dilan Fullenwider, Eleonore Chiquito, MD  famotidine (PEPCID) 20 MG tablet Take 1 tablet (20 mg total) by mouth at bedtime. 07/30/23  Yes Lavern Crimi, Eleonore Chiquito, MD  Vitamin D, Ergocalciferol, (DRISDOL) 1.25 MG (50000 UNIT) CAPS capsule Take 1 capsule (50,000 Units total) by mouth every 7 (seven) days. 05/21/23  Yes Shelva Majestic, MD  ANORO ELLIPTA 62.5-25 MCG/ACT AEPB TAKE 1 PUFF BY MOUTH EVERY DAY 05/21/23  Shelva Majestic, MD  promethazine (PHENERGAN) 25 MG tablet Take 1 tablet (25 mg total) by mouth every 6 (six) hours as needed for nausea or vomiting. 09/28/13 09/28/13  Lorre Nick, MD    Current Outpatient Medications  Medication Sig Dispense Refill   amLODipine (NORVASC) 2.5 MG tablet TAKE 1 TABLET BY MOUTH EVERY DAY 90 tablet 2   anastrozole (ARIMIDEX) 1 MG tablet TAKE 1 TABLET BY MOUTH EVERY DAY 90 tablet 3   atorvastatin (LIPITOR) 40 MG tablet TAKE 1 TABLET BY MOUTH EVERY DAY 90 tablet 1   citalopram (CELEXA) 20 MG tablet TAKE 1 TABLET BY MOUTH EVERY DAY 90 tablet 1   esomeprazole (NEXIUM) 40 MG capsule Take 1 capsule (40 mg total) by mouth 2 (two) times daily before a  meal. 60 capsule 6   famotidine (PEPCID) 20 MG tablet Take 1 tablet (20 mg total) by mouth at bedtime. 30 tablet 3   Vitamin D, Ergocalciferol, (DRISDOL) 1.25 MG (50000 UNIT) CAPS capsule Take 1 capsule (50,000 Units total) by mouth every 7 (seven) days. 13 capsule 1   ANORO ELLIPTA 62.5-25 MCG/ACT AEPB TAKE 1 PUFF BY MOUTH EVERY DAY 180 each 3   Current Facility-Administered Medications  Medication Dose Route Frequency Provider Last Rate Last Admin   0.9 %  sodium chloride infusion  500 mL Intravenous Once Napoleon Form, MD        Allergies as of 09/03/2023 - Review Complete 09/03/2023  Allergen Reaction Noted   Penicillins Other (See Comments) 08/01/2008   Reclast [zoledronic acid] Other (See Comments) 04/09/2022    Family History  Problem Relation Age of Onset   Hypertension Mother    Cancer Mother        apendix. right hemicolectomy preventative surgery   Other Mother        MGUS   Alcohol abuse Father    Cirrhosis Father        deceased form cirrhosis   Hypertension Father    Hypertension Sister    Hypertension Brother    Hyperlipidemia Other    Hiatal hernia Other        several family members   Other Other        Fh of abnormal glucose   Colon cancer Neg Hx    Esophageal cancer Neg Hx    Rectal cancer Neg Hx    Colon polyps Neg Hx    Stomach cancer Neg Hx     Social History   Socioeconomic History   Marital status: Married    Spouse name: Not on file   Number of children: 2   Years of education: Not on file   Highest education level: Not on file  Occupational History   Occupation: Materials engineer   Tobacco Use   Smoking status: Every Day    Current packs/day: 1.00    Average packs/day: 1 pack/day for 45.1 years (45.1 ttl pk-yrs)    Types: Cigarettes    Start date: 07/15/1978   Smokeless tobacco: Never  Vaping Use   Vaping status: Never Used  Substance and Sexual Activity   Alcohol use: No    Alcohol/week: 0.0 standard drinks of alcohol    Drug use: No   Sexual activity: Not Currently  Other Topics Concern   Not on file  Social History Narrative   Married over 30 years. Sons 29,35 in 2017. 2 grandkis- 1 grandaughter - Leandrew Koyanagi . 1 grandson. Hudson lewis. All grandkids same son  Cared for mother before death in 2018/10/02, husband alcoholic- tremendous stress. Sister stays with mom- stressor      Occupation: office admin - [employer]    Social Drivers of Health   Financial Resource Strain: Low Risk  (06/10/2023)   Overall Financial Resource Strain (CARDIA)    Difficulty of Paying Living Expenses: Not hard at all  Food Insecurity: No Food Insecurity (06/10/2023)   Hunger Vital Sign    Worried About Running Out of Food in the Last Year: Never true    Ran Out of Food in the Last Year: Never true  Transportation Needs: No Transportation Needs (06/10/2023)   PRAPARE - Administrator, Civil Service (Medical): No    Lack of Transportation (Non-Medical): No  Physical Activity: Insufficiently Active (06/10/2023)   Exercise Vital Sign    Days of Exercise per Week: 5 days    Minutes of Exercise per Session: 10 min  Stress: No Stress Concern Present (06/10/2023)   [employer] of Occupational Health - Occupational Stress Questionnaire    Feeling of Stress : Not at all  Social Connections: Moderately Isolated (06/10/2023)   Social Connection and Isolation Panel [NHANES]    Frequency of Communication with Friends and Family: More than three times a week    Frequency of Social Gatherings with Friends and Family: Twice a week    Attends Religious Services: Never    Database administrator or Organizations: No    Attends Banker Meetings: Never    Marital Status: Married  Catering manager Violence: Not At Risk (06/10/2023)   Humiliation, Afraid, Rape, and Kick questionnaire    Fear of Current or Ex-Partner: No    Emotionally Abused: No    Physically Abused: No    Sexually Abused: No     Review of Systems:  All other review of systems negative except as mentioned in the HPI.  Physical Exam: Vital signs in last 24 hours: BP (!) 175/85   Pulse 74   Temp (!) 97.3 F (36.3 C) (Temporal)   Resp 12   Ht 5\' 5"  (1.651 m)   Wt 219 lb (99.3 kg)   SpO2 95%   BMI 36.44 kg/m  General:   Alert, NAD Lungs:  Clear .   Heart:  Regular rate and rhythm Abdomen:  Soft, nontender and nondistended. Neuro/Psych:  Alert and cooperative. Normal mood and affect. A and O x 3  Reviewed labs, radiology imaging, old records and pertinent past GI work up  Patient is appropriate for planned procedure(s) and anesthesia in an ambulatory setting   K. Scherry Ran , MD [phone]

## 2023-09-03 NOTE — Progress Notes (Signed)
 Called to room to assist during endoscopic procedure.  Patient ID and intended procedure confirmed with present staff. Received instructions for my participation in the procedure from the performing physician.

## 2023-09-03 NOTE — Patient Instructions (Signed)
-  Antireflux  handout provided. -await pathology results -Continue present medications   YOU HAD AN ENDOSCOPIC PROCEDURE TODAY AT THE [facility]:   Refer to the procedure report that was given to you for any specific questions about what was found during the examination.  If the procedure report does not answer your questions, please call your gastroenterologist to clarify.  If you requested that your care partner not be given the details of your procedure findings, then the procedure report has been included in a sealed envelope for you to review at your convenience later.  YOU SHOULD EXPECT: Some feelings of bloating in the abdomen. Passage of more gas than usual.  Walking can help get rid of the air that was put into your GI tract during the procedure and reduce the bloating. If you had a lower endoscopy (such as a colonoscopy or flexible sigmoidoscopy) you may notice spotting of blood in your stool or on the toilet paper. If you underwent a bowel prep for your procedure, you may not have a normal bowel movement for a few days.  Please Note:  You might notice some irritation and congestion in your nose or some drainage.  This is from the oxygen used during your procedure.  There is no need for concern and it should clear up in a day or so.  SYMPTOMS TO REPORT IMMEDIATELY:  Following upper endoscopy (EGD)  Vomiting of blood or coffee ground material  New chest pain or pain under the shoulder blades  Painful or persistently difficult swallowing  New shortness of breath  Fever of 100F or higher  Black, tarry-looking stools  For urgent or emergent issues, a gastroenterologist can be reached at any hour by calling [phone]. Do not use MyChart messaging for urgent concerns.    DIET:  We do recommend a small meal at first, but then you may proceed to your regular diet.  Drink plenty of fluids but you should avoid alcoholic beverages for 24 hours.  ACTIVITY:  You should plan  to take it easy for the rest of today and you should NOT DRIVE or use heavy machinery until tomorrow (because of the sedation medicines used during the test).    FOLLOW UP: Our staff will call the number listed on your records the next business day following your procedure.  We will call around 7:15- 8:00 am to check on you and address any questions or concerns that you may have regarding the information given to you following your procedure. If we do not reach you, we will leave a message.     If any biopsies were taken you will be contacted by phone or by letter within the next 1-3 weeks.  Please call us at [phone] if you have not heard about the biopsies in 3 weeks.    SIGNATURES/CONFIDENTIALITY: You and/or your care partner have signed paperwork which will be entered into your electronic medical record.  These signatures attest to the fact that that the information above on your After Visit Summary has been reviewed and is understood.  Full responsibility of the confidentiality of this discharge information lies with you and/or your care-partner.

## 2023-09-04 ENCOUNTER — Telehealth: Payer: Self-pay

## 2023-09-04 NOTE — Telephone Encounter (Signed)
  Follow up Call-     09/03/2023    9:11 AM 01/22/2022    7:29 AM  Call back number  Post procedure Call Back phone  # [phone]  Permission to leave phone message Yes Yes     Patient questions:  Do you have a fever, pain , or abdominal swelling? No. Pain Score  0 *  Have you tolerated food without any problems? Yes.    Have you been able to return to your normal activities? Yes.    Do you have any questions about your discharge instructions: Diet   No. Medications  No. Follow up visit  No.  Do you have questions or concerns about your Care? No.  Actions: * If pain score is 4 or above: No action needed, pain <4.

## 2023-09-05 ENCOUNTER — Encounter: Payer: Self-pay | Admitting: Gastroenterology

## 2023-09-05 LAB — SURGICAL PATHOLOGY

## 2023-09-08 ENCOUNTER — Ambulatory Visit: Payer: PRIVATE HEALTH INSURANCE | Admitting: Hematology and Oncology

## 2023-09-08 ENCOUNTER — Inpatient Hospital Stay: Payer: Medicare Other | Attending: Hematology and Oncology | Admitting: Hematology and Oncology

## 2023-09-08 DIAGNOSIS — Z170 Estrogen receptor positive status [ER+]: Secondary | ICD-10-CM | POA: Insufficient documentation

## 2023-09-08 DIAGNOSIS — C50912 Malignant neoplasm of unspecified site of left female breast: Secondary | ICD-10-CM | POA: Diagnosis not present

## 2023-09-08 DIAGNOSIS — Z9011 Acquired absence of right breast and nipple: Secondary | ICD-10-CM | POA: Diagnosis not present

## 2023-09-08 DIAGNOSIS — C50911 Malignant neoplasm of unspecified site of right female breast: Secondary | ICD-10-CM

## 2023-09-08 DIAGNOSIS — Z79811 Long term (current) use of aromatase inhibitors: Secondary | ICD-10-CM | POA: Insufficient documentation

## 2023-09-08 DIAGNOSIS — C50412 Malignant neoplasm of upper-outer quadrant of left female breast: Secondary | ICD-10-CM | POA: Insufficient documentation

## 2023-09-08 DIAGNOSIS — C50811 Malignant neoplasm of overlapping sites of right female breast: Secondary | ICD-10-CM | POA: Insufficient documentation

## 2023-09-08 DIAGNOSIS — C792 Secondary malignant neoplasm of skin: Secondary | ICD-10-CM | POA: Diagnosis present

## 2023-09-08 NOTE — Assessment & Plan Note (Signed)
 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 grade 2, HER-2 negative (1+), ER+ 80%, PR+ 50%, Ki67 2%. T4N0 stage IIIA   CT CAP 04/06/2020: Stomach wall thickening?  Gastritis, small pulmonary nodules 4 mm and 3 mm, hepatomegaly with suspicion for portal gastropathy however no frank signs of cirrhosis.  No metastatic disease Bone scan 04/06/2020: No bone metastatic disease.    Oncotype DX: 2, distant recurrence at 9 years: 3%   Treatment plan: 1. Adjuvant radiation therapy 07/03/2020-08/21/2020 2. Adjuvant antiestrogen therapy with anastrozole 1 mg daily x7 years,  started November 2022   Anastrozole toxicities: Denies hot flashes No joint stiffness   Breast cancer surveillance:  Mammograms 01/22/2023: Clinical follow-up with the tender area of concern recommended otherwise density is category C Breast exam 09/08/2023: Benign   Lung nodules: CT chest 09/03/2022: Emphysema with tiny nodules stable going back to 2021, some of the nodules were not clearly seen on the current scan fatty liver   Based on stable CT findings we can conclude that these lung nodules are benign. Return to clinic in 1 year for follow-up

## 2023-09-08 NOTE — Progress Notes (Signed)
 Patient Care Team: Shelva Majestic, MD as PCP - General (Family Medicine) Serena Croissant, MD as Consulting Physician (Hematology and Oncology) Dorothy Puffer, MD as Consulting Physician (Radiation Oncology) Ovidio Kin, MD as Consulting Physician (General Surgery)  DIAGNOSIS:  Encounter Diagnosis  Name Primary?   Bilateral malignant neoplasm of breast in female, unspecified estrogen receptor status, unspecified site of breast (HCC) Yes    SUMMARY OF ONCOLOGIC HISTORY: Oncology History  Bilateral breast cancer (HCC)  03/15/2020 Initial Diagnosis   Right breast, 0.9cm right breast mass at the 12 o'clock position, and no axillary adenopathy. grade 1 invasive ductal carcinoma with high grade DCIS, HER-2 equivocal by IHC, negative by FISH, ER+ 100%, PR+ 100%, Ki67 2% T1BN0 stage Ia left breast: 2.5cm mass in the left breast at the 1 o'clock position with likely skin involvement in the nipple-areola complex, Invasive lobular carcinoma, grade 2, HER-2 negative (1+), ER+ 80%, PR+ 50%, Ki67 2%. 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   05/09/2020 Cancer Staging   Staging form: Breast, AJCC 8th Edition - Pathologic stage from 05/09/2020: Stage IA (pT1b, pN0, cM0, G1, ER+, PR+, HER2-)   05/18/2020 Cancer Staging   Staging form: Breast, AJCC 8th Edition - Pathologic stage from 05/18/2020: Stage IIIA (pT4, pN0(mol+), cM0, G2, ER+, PR+, HER2-)   06/15/2020 Oncotype testing   Oncotype score: 2, distant recurrence at 9 years: 3%   07/03/2020 - 08/21/2020 Radiation Therapy   The patient initially received a dose of 50.40 Gy in 28 fractions to the RIGHT breast, LEFT chest wall, and LEFT  supraclavicular area. using whole-breast tangent fields. This was delivered using a 3-D conformal technique. The pt received a boost delivering an additional 10 Gy in 5 fractions to the LEFT chest wall and RIGHT breast using a electron boost with electrons.   08/2020 - 08/2027 Anti-estrogen oral therapy   Anastrozole     CHIEF COMPLIANT: Intermittent bilateral breast pains spasms especially on the left  HISTORY OF PRESENT ILLNESS:  History of Present Illness The patient, with a history of bilateral mastectomy and lumpectomy for breast cancer, presents with intermittent, intense, stabbing breast pain. The pain, which is sensitive to touch and certain movements, can 'take my breath away.' The patient describes the pain as a muscle spasm that slowly eases off. The patient has been managing the pain by learning to breathe through it and relax her body. The patient is currently on anastrozole for breast cancer treatment.     ALLERGIES:  is allergic to penicillins and reclast [zoledronic acid].  MEDICATIONS:  Current Outpatient Medications  Medication Sig Dispense Refill   amLODipine (NORVASC) 2.5 MG tablet TAKE 1 TABLET BY MOUTH EVERY DAY 90 tablet 2   anastrozole (ARIMIDEX) 1 MG tablet TAKE 1 TABLET BY MOUTH EVERY DAY 90 tablet 3   ANORO ELLIPTA 62.5-25 MCG/ACT AEPB TAKE 1 PUFF BY MOUTH EVERY DAY 180 each 3   atorvastatin (LIPITOR) 40 MG tablet TAKE 1 TABLET BY MOUTH EVERY DAY 90 tablet 1   citalopram (CELEXA) 20 MG tablet TAKE 1 TABLET BY MOUTH EVERY DAY 90 tablet 1   esomeprazole (NEXIUM) 40 MG capsule Take 1 capsule (40 mg total) by mouth 2 (two) times daily before  a meal. 60 capsule 6   famotidine (PEPCID) 20 MG tablet Take 1 tablet (20 mg total) by mouth at bedtime. 30 tablet 3   Vitamin D, Ergocalciferol, (DRISDOL) 1.25 MG (50000 UNIT) CAPS capsule Take 1 capsule (50,000 Units total) by mouth every 7 (seven) days. 13 capsule 1   No current facility-administered medications for  this visit.    PHYSICAL EXAMINATION: ECOG PERFORMANCE STATUS: 1 - Symptomatic but completely ambulatory  There were no vitals filed for this visit. There were no vitals filed for this visit.  Physical Exam   (exam performed in the presence of a chaperone)  LABORATORY DATA:  I have reviewed the data as listed    Latest Ref Rng & Units 05/20/2023    3:43 PM 04/03/2021    2:18 PM 02/08/2021    1:14 PM  CMP  Glucose 70 - 99 mg/dL 409  99    BUN 6 - 23 mg/dL 10  7    Creatinine 8.11 - 1.20 mg/dL 9.14  7.82  9.56   Sodium 135 - 145 mEq/L 138  141    Potassium 3.5 - 5.1 mEq/L 4.2  4.0    Chloride 96 - 112 mEq/L 103  103    CO2 19 - 32 mEq/L 26  29    Calcium 8.4 - 10.5 mg/dL 9.9  9.4    Total Protein 6.0 - 8.3 g/dL 7.7  7.4    Total Bilirubin 0.2 - 1.2 mg/dL 0.5  0.7    Alkaline Phos 39 - 117 U/L 147  117    AST 0 - 37 U/L 11  12    ALT 0 - 35 U/L 12  10      Lab Results  Component Value Date   WBC 11.0 (H) 05/20/2023   HGB 14.3 05/20/2023   HCT 43.9 05/20/2023   MCV 90.6 05/20/2023   PLT 250.0 05/20/2023   NEUTROABS 8.3 (H) 05/20/2023    ASSESSMENT & PLAN:  Bilateral breast cancer (HCC) Right lumpectomy and left mastectomy Ezzard Standing): 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 grade 2, HER-2 negative (1+), ER+ 80%, PR+ 50%, Ki67 2%. T4N0 stage IIIA   CT CAP 04/06/2020: Stomach wall thickening?  Gastritis, small pulmonary nodules 4 mm and 3 mm, hepatomegaly with suspicion for portal gastropathy however no frank signs of cirrhosis.  No metastatic disease Bone scan 04/06/2020: No bone metastatic disease.    Oncotype DX: 2, distant recurrence at 9 years: 3%   Treatment plan: 1. Adjuvant radiation  therapy 07/03/2020-08/21/2020 2. Adjuvant antiestrogen therapy with anastrozole 1 mg daily x7 years,  started November 2022   Anastrozole toxicities: Denies hot flashes No joint stiffness   Breast cancer surveillance:  Mammograms 01/22/2023: Clinical follow-up with the tender area of concern recommended otherwise density is category C Breast exam 09/08/2023: Benign   Lung nodules: CT chest 09/03/2022: Emphysema with tiny nodules stable going back to 2021, some of the nodules were not clearly seen on the current scan fatty liver   Assessment & Plan Post-Mastectomy Pain Syndrome Reports of intermittent, severe, stabbing pain in the breast area, likely due to muscle spasms. Pain is not constant, but can be intense and take the patient's breath away. -Consider over-the-counter 200mg   Magnesium supplement daily if spasms become more frequent. -Consider use of CBD oil if pain is associated with surgical scar. -Continue current pain management strategy of deep breathing and relaxation during spasms.  Breast Cancer Three and a half years post-surgery and three years on Anastrozole with no reported issues. -Continue Anastrozole as prescribed. -No need for additional refills at this time.  Follow-up Recheck patient's condition and assess need for any changes in management.      No orders of the defined types were placed in this encounter.  The patient has a good understanding of the overall plan. she agrees with it. she will call with any problems that may develop before the next visit here. Total time spent: 30 mins including face to face time and time spent for planning, charting and co-ordination of care   Tamsen Meek, MD 09/08/23

## 2023-09-23 ENCOUNTER — Other Ambulatory Visit: Payer: Self-pay | Admitting: Family Medicine

## 2023-10-10 ENCOUNTER — Encounter: Payer: PRIVATE HEALTH INSURANCE | Admitting: Family Medicine

## 2023-10-20 ENCOUNTER — Encounter: Payer: Self-pay | Admitting: Gastroenterology

## 2023-11-17 ENCOUNTER — Ambulatory Visit: Payer: Medicare Other | Admitting: Family Medicine

## 2023-12-04 ENCOUNTER — Ambulatory Visit (INDEPENDENT_AMBULATORY_CARE_PROVIDER_SITE_OTHER): Admitting: Family Medicine

## 2023-12-04 ENCOUNTER — Encounter: Payer: Self-pay | Admitting: Family Medicine

## 2023-12-04 VITALS — BP 136/78 | HR 81 | Temp 97.5°F | Resp 18 | Ht 65.0 in | Wt 216.5 lb

## 2023-12-04 DIAGNOSIS — E785 Hyperlipidemia, unspecified: Secondary | ICD-10-CM

## 2023-12-04 DIAGNOSIS — Z131 Encounter for screening for diabetes mellitus: Secondary | ICD-10-CM

## 2023-12-04 DIAGNOSIS — I10 Essential (primary) hypertension: Secondary | ICD-10-CM

## 2023-12-04 DIAGNOSIS — I700 Atherosclerosis of aorta: Secondary | ICD-10-CM

## 2023-12-04 DIAGNOSIS — R739 Hyperglycemia, unspecified: Secondary | ICD-10-CM | POA: Diagnosis not present

## 2023-12-04 DIAGNOSIS — M810 Age-related osteoporosis without current pathological fracture: Secondary | ICD-10-CM

## 2023-12-04 NOTE — Patient Instructions (Addendum)
 Please stop by lab before you go If you have mychart- we will send your results within 3 business days of us  receiving them.  If you do not have mychart- we will call you about results within 5 business days of us  receiving them.  *please also note that you will see labs on mychart as soon as they post. I will later go in and write notes on them- will say "notes from Dr. Arlene Ben"   blood pressure just slightly above goal 135/85-she does have a cuff and I asked after 2-3 weeks with at least 5-10 readings from home and hold steady on dose for now  Would love for you to quit smoking  Recommended follow up: Return in about 6 months (around 06/05/2024) for followup or sooner if needed.Schedule b4 you leave.

## 2023-12-04 NOTE — Progress Notes (Signed)
 Phone [phone] In person visit   Subjective:   [name] [name] is a 68 y.o. year old very pleasant female patient who presents for/with See problem oriented charting Chief Complaint  Patient presents with   Medical Management of Chronic Issues    6 month follow-up  Numbness in buttocks when standing a lot    Past Medical History-  Patient Active Problem List   Diagnosis Date Noted   Emphysema lung (HCC) 04/03/2021    Priority: High   Bilateral breast cancer (HCC) 03/24/2020    Priority: High   TOBACCO ABUSE 09/30/2008    Priority: High   Fatty liver 04/03/2021    Priority: Medium    Aortic atherosclerosis (HCC) 04/03/2021    Priority: Medium    Hypertension 10/20/2017    Priority: Medium    History of squamous cell carcinoma of skin 09/12/2016    Priority: Medium    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 05/23/2016    Priority: Medium    Adjustment disorder with mixed anxiety and depressed mood 08/17/2012    Priority: Medium    Hot flashes 03/23/2012    Priority: Medium    GERD 08/28/2009    Priority: Medium    Abnormal glucose 08/28/2009    Priority: Medium    PARESTHESIA 11/11/2008    Priority: Medium    Hyperlipemia 09/30/2008    Priority: Medium    Osteoporosis 09/30/2008    Priority: Medium    OSA (obstructive sleep apnea) 01/18/2013    Priority: Low   History of colonic polyps 11/11/2008    Priority: Low   Benign paroxysmal positional vertigo 10/25/2008    Priority: Low    Medications- reviewed and updated Current Outpatient Medications  Medication Sig Dispense Refill   amLODipine  (NORVASC ) 2.5 MG tablet TAKE 1 TABLET BY MOUTH EVERY DAY 90 tablet 2   anastrozole  (ARIMIDEX ) 1 MG tablet TAKE 1 TABLET BY MOUTH EVERY DAY 90 tablet 3   ANORO ELLIPTA  62.5-25 MCG/ACT AEPB TAKE 1 PUFF BY MOUTH EVERY DAY 180 each 3   atorvastatin  (LIPITOR) 40 MG tablet TAKE 1 TABLET BY MOUTH EVERY DAY 90 tablet 1   citalopram  (CELEXA ) 20 MG tablet TAKE 1 TABLET BY MOUTH EVERY  DAY 90 tablet 1   esomeprazole  (NEXIUM ) 40 MG capsule TAKE ONE CAPSULE BY MOUTH TWO TIMES DAILY BEFORE A MEAL 60 capsule 6   famotidine  (PEPCID ) 20 MG tablet Take 1 tablet (20 mg total) by mouth at bedtime. 30 tablet 3   Vitamin D , Ergocalciferol , (DRISDOL ) 1.25 MG (50000 UNIT) CAPS capsule Take 1 capsule (50,000 Units total) by mouth every 7 (seven) days. 13 capsule 1   No current facility-administered medications for this visit.     Objective:  BP 136/78   Pulse 81   Temp (!) 97.5 F (36.4 C) (Temporal)   Resp 18   Ht 5\' 5"  (1.651 m)   Wt 216 lb 8 oz (98.2 kg)   SpO2 94%   BMI 36.03 kg/m  Gen: NAD, resting comfortably CV: RRR no murmurs rubs or gallops Lungs: CTAB no crackles, wheeze, rhonchi Ext: no edema Skin: warm, dry     Assessment and Plan   # Paresthesias in hip/thigh S: Patient reports paresthesias in her hip/thigh when standing a lot (happens sparingly over last 3-4 years)-no pain . Can be either side. No back pain with it - hip bursitis injection in the past  A/P: no back pain and not active right now and very intermittent-  gets better  with rest- if increases in frequency may refer to sports medicine or [facility]   #Smoking- 1 PPD still- encouraged cessation- not ready to quit.    #breast cancer 03/15/2020 -remains on Arimidex - regular follow up with Dr. Lee Public - some lingering pain after radiation and surgery  #Osteoporosis- Bone density with GYN- improved from prior- she is trying to get us  a copy [facility]. Could be improving due to repletion of vitamin D .  -shed prefer for me or Dr. Lee Public to manage. Fosamax not ideal with reflux. Reclast severe muscle and joint pain for 4 days.  - possibly Prolia - need records  #hypertension S: medication: Amlodipine  2.5 mg BP Readings from Last 3 Encounters:  12/04/23 136/78  09/03/23 (!) 130/52  07/30/23 130/78  A/P: blood pressure just slightly above goal 135/85-she does have a cuff  and I asked after 2-3 weeks with at least 5-10 readings from home and hold steady on dose for now -drive here was stressful and could contribute  #hyperlipidemia # Aortic atherosclerosis S: Medication:Atorvastatin  40 mg Lab Results  Component Value Date   CHOL 169 05/20/2023   HDL 43.70 05/20/2023   LDLCALC 91 05/20/2023   LDLDIRECT 190.0 12/18/2017   TRIG 170.0 (H) 05/20/2023   CHOLHDL 4 05/20/2023  A/P: Hyperlipidemia-reasonable control Aortic atherosclerosis (presumed stable)- LDL goal ideally <70 - slightly above goal- continue current medications and focus on healthy eating and regular exercise   # Hyperglycemia/insulin resistance/prediabetes S:  Medication: none Exercise and diet- not exercising regularly. Down 2 lbs  Lab Results  Component Value Date   HGBA1C 6.1 05/20/2023   HGBA1C 5.6 05/02/2016   HGBA1C 5.9 (H) 01/13/2015   A/P:  hopefully stable- update a1c  today. Continue without meds for now - encouraged exercise  # Anxiety S:Medication: Citalopram  20 mg. Mild poor sleep -no suicidal ideation  A/P:  reports reasonable control- continue current medications    # Emphysema-on Anoro  (but spaces due to cost) with albuterol  available if needed- not needing lately- reasonable control   # GERD-sees Dr. Nandigam S:Medication: Nexium  40 mg daily with Pepcid  available as needed A/P: reasonable control- continue current medications    #Vitamin D  deficiency S: Medication: high dose vitamin D - hasn't started 2nd prescription- did finish first and plans to start A/P: hopefully improving- update vitamin D   # Fatty liver-incidental finding on prior chest CT- alk phos slightly high but could be due to nonfasting Lab Results  Component Value Date   ALT 12 05/20/2023   AST 11 05/20/2023   ALKPHOS 147 (H) 05/20/2023   BILITOT 0.5 05/20/2023   Recommended follow up: Return in about 6 months (around 06/05/2024) for followup or sooner if needed.Schedule b4 you leave. Future  Appointments  Date Time Provider Department Center  09/07/2024 11:45 AM Cameron Cea, MD [facility] None   Lab/Order associations:   ICD-10-CM   1. Hypertension, unspecified type  I10 Comprehensive metabolic panel with GFR    CBC with Differential/Platelet    2. Hyperlipidemia, unspecified hyperlipidemia type  E78.5 Comprehensive metabolic panel with GFR    CBC with Differential/Platelet    3. Osteoporosis, unspecified osteoporosis type, unspecified pathological fracture presence  M81.0 VITAMIN D  25 Hydroxy (Vit-D Deficiency, Fractures)    4. Aortic atherosclerosis (HCC)  I70.0     5. Screening for diabetes mellitus  Z13.1 Hemoglobin A1c    6. Hyperglycemia  R73.9 Hemoglobin A1c     No orders of the defined types were placed in  this encounter.  Return precautions advised.  Clarisa Crooked, MD

## 2023-12-05 ENCOUNTER — Ambulatory Visit: Payer: Self-pay | Admitting: Family Medicine

## 2023-12-05 LAB — COMPREHENSIVE METABOLIC PANEL WITH GFR
ALT: 14 U/L (ref 0–35)
AST: 13 U/L (ref 0–37)
Albumin: 4.4 g/dL (ref 3.5–5.2)
Alkaline Phosphatase: 139 U/L — ABNORMAL HIGH (ref 39–117)
BUN: 9 mg/dL (ref 6–23)
CO2: 25 meq/L (ref 19–32)
Calcium: 9.7 mg/dL (ref 8.4–10.5)
Chloride: 104 meq/L (ref 96–112)
Creatinine, Ser: 0.65 mg/dL (ref 0.40–1.20)
GFR: 91.62 mL/min (ref >60.00)
Glucose, Bld: 116 mg/dL — ABNORMAL HIGH (ref 70–99)
Potassium: 4.1 meq/L (ref 3.5–5.1)
Sodium: 140 meq/L (ref 135–145)
Total Bilirubin: 0.5 mg/dL (ref 0.2–1.2)
Total Protein: 7.5 g/dL (ref 6.0–8.3)

## 2023-12-05 LAB — CBC WITH DIFFERENTIAL/PLATELET
Basophils Absolute: 0.1 10*3/uL (ref 0.0–0.1)
Basophils Relative: 1.1 % (ref 0.0–3.0)
Eosinophils Absolute: 0.2 10*3/uL (ref 0.0–0.7)
Eosinophils Relative: 1.8 % (ref 0.0–5.0)
HCT: 42.4 % (ref 36.0–46.0)
Hemoglobin: 14.2 g/dL (ref 12.0–15.0)
Lymphocytes Relative: 15.1 % (ref 12.0–46.0)
Lymphs Abs: 1.3 10*3/uL (ref 0.7–4.0)
MCHC: 33.5 g/dL (ref 30.0–36.0)
MCV: 89 fl (ref 78.0–100.0)
Monocytes Absolute: 0.5 10*3/uL (ref 0.1–1.0)
Monocytes Relative: 5.3 % (ref 3.0–12.0)
Neutro Abs: 6.6 10*3/uL (ref 1.4–7.7)
Neutrophils Relative %: 76.7 % (ref 43.0–77.0)
Platelets: 232 10*3/uL (ref 150.0–400.0)
RBC: 4.77 Mil/uL (ref 3.87–5.11)
RDW: 14.6 % (ref 11.5–15.5)
WBC: 8.6 10*3/uL (ref 4.0–10.5)

## 2023-12-05 LAB — VITAMIN D 25 HYDROXY (VIT D DEFICIENCY, FRACTURES): VITD: 19.15 ng/mL — ABNORMAL LOW (ref 30.00–100.00)

## 2023-12-05 LAB — HEMOGLOBIN A1C: Hgb A1c MFr Bld: 6 % (ref 4.6–6.5)

## 2023-12-12 ENCOUNTER — Other Ambulatory Visit: Payer: Self-pay | Admitting: Hematology and Oncology

## 2023-12-12 DIAGNOSIS — Z853 Personal history of malignant neoplasm of breast: Secondary | ICD-10-CM

## 2024-01-23 ENCOUNTER — Ambulatory Visit
Admission: RE | Admit: 2024-01-23 | Discharge: 2024-01-23 | Disposition: A | Discharge status: Home / Self Care | Source: Ambulatory Visit | Attending: Hematology and Oncology

## 2024-01-23 DIAGNOSIS — Z853 Personal history of malignant neoplasm of breast: Secondary | ICD-10-CM

## 2024-01-24 ENCOUNTER — Other Ambulatory Visit: Payer: Self-pay | Admitting: Family Medicine

## 2024-01-24 DIAGNOSIS — F4323 Adjustment disorder with mixed anxiety and depressed mood: Secondary | ICD-10-CM

## 2024-02-03 ENCOUNTER — Other Ambulatory Visit (HOSPITAL_COMMUNITY): Payer: Self-pay

## 2024-02-03 ENCOUNTER — Telehealth: Payer: Self-pay

## 2024-02-03 NOTE — Telephone Encounter (Signed)
 Pharmacy Patient Advocate Encounter   Received notification from CoverMyMeds that prior authorization for Esomeprazole  Magnesium  40MG  dr capsules is required/requested.   Insurance verification completed.   The patient is insured through [employer] .   Per test claim: PA required; PA submitted to above mentioned insurance via CoverMyMeds Key/confirmation #/EOC [id number] Status is pending

## 2024-02-04 NOTE — Telephone Encounter (Signed)
 Pt notified via my chart. She does check her messages.

## 2024-02-04 NOTE — Telephone Encounter (Signed)
 Pharmacy Patient Advocate Encounter  Received notification from [employer] [facility] Medicare that Prior Authorization for Esomeprazole  Magnesium  40MG  dr capsules has been APPROVED from 01-04-2024 to 02-02-2025   PA #/Case ID/Reference #: [id number]

## 2024-03-21 ENCOUNTER — Other Ambulatory Visit: Payer: Self-pay | Admitting: Gastroenterology

## 2024-06-07 ENCOUNTER — Ambulatory Visit (INDEPENDENT_AMBULATORY_CARE_PROVIDER_SITE_OTHER): Admitting: Family Medicine

## 2024-06-07 ENCOUNTER — Encounter: Payer: Self-pay | Admitting: Family Medicine

## 2024-06-07 VITALS — BP 130/72 | HR 80 | Temp 98.2°F | Ht 65.0 in | Wt 210.8 lb

## 2024-06-07 DIAGNOSIS — R32 Unspecified urinary incontinence: Secondary | ICD-10-CM

## 2024-06-07 DIAGNOSIS — E785 Hyperlipidemia, unspecified: Secondary | ICD-10-CM

## 2024-06-07 DIAGNOSIS — F17200 Nicotine dependence, unspecified, uncomplicated: Secondary | ICD-10-CM

## 2024-06-07 DIAGNOSIS — Z131 Encounter for screening for diabetes mellitus: Secondary | ICD-10-CM

## 2024-06-07 DIAGNOSIS — M810 Age-related osteoporosis without current pathological fracture: Secondary | ICD-10-CM | POA: Diagnosis not present

## 2024-06-07 DIAGNOSIS — R739 Hyperglycemia, unspecified: Secondary | ICD-10-CM | POA: Diagnosis not present

## 2024-06-07 DIAGNOSIS — I10 Essential (primary) hypertension: Secondary | ICD-10-CM

## 2024-06-07 DIAGNOSIS — R350 Frequency of micturition: Secondary | ICD-10-CM

## 2024-06-07 NOTE — Patient Instructions (Addendum)
 Please stop by lab before you go If you have mychart- we will send your results within 3 business days of us  receiving them.  If you do not have mychart- we will call you about results within 5 business days of us  receiving them.  *please also note that you will see labs on mychart as soon as they post. I will later go in and write notes on them- will say notes from Dr. Katrinka   As always would love to see you quit smoking  Congrats on weight loss! Keep it up  No changes today unless labs lead us  to make changes  Recommended follow up: Return in about 6 months (around 12/05/2024) for followup or sooner if needed.Schedule b4 you leave.

## 2024-06-07 NOTE — Progress Notes (Signed)
 Phone [phone] In person visit   Subjective:   [name] [name] is a 68 y.o. year old very pleasant female patient who presents for/with See problem oriented charting Chief Complaint  Patient presents with   Medical Management of Chronic Issues    6 month follow up;    Hypertension    Past Medical History-  Patient Active Problem List   Diagnosis Date Noted   Emphysema lung (HCC) 04/03/2021    Priority: High   Bilateral breast cancer (HCC) 03/24/2020    Priority: High   TOBACCO ABUSE 09/30/2008    Priority: High   Fatty liver 04/03/2021    Priority: Medium    Aortic atherosclerosis 04/03/2021    Priority: Medium    Hypertension 10/20/2017    Priority: Medium    History of squamous cell carcinoma of skin 09/12/2016    Priority: Medium    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 05/23/2016    Priority: Medium    Adjustment disorder with mixed anxiety and depressed mood 08/17/2012    Priority: Medium    Hot flashes 03/23/2012    Priority: Medium    GERD 08/28/2009    Priority: Medium    Abnormal glucose 08/28/2009    Priority: Medium    PARESTHESIA 11/11/2008    Priority: Medium    Hyperlipemia 09/30/2008    Priority: Medium    Osteoporosis 09/30/2008    Priority: Medium    OSA (obstructive sleep apnea) 01/18/2013    Priority: Low   History of colonic polyps 11/11/2008    Priority: Low   Benign paroxysmal positional vertigo 10/25/2008    Priority: Low   Obesity, morbid (HCC) 06/07/2024    Medications- reviewed and updated Current Outpatient Medications  Medication Sig Dispense Refill   amLODipine  (NORVASC ) 2.5 MG tablet TAKE 1 TABLET BY MOUTH EVERY DAY 90 tablet 2   anastrozole  (ARIMIDEX ) 1 MG tablet TAKE 1 TABLET BY MOUTH EVERY DAY 90 tablet 3   ANORO ELLIPTA  62.5-25 MCG/ACT AEPB TAKE 1 PUFF BY MOUTH EVERY DAY 180 each 3   atorvastatin  (LIPITOR) 40 MG tablet TAKE 1 TABLET BY MOUTH EVERY DAY 90 tablet 1   citalopram  (CELEXA ) 20 MG tablet TAKE 1 TABLET BY MOUTH  EVERY DAY 90 tablet 1   esomeprazole  (NEXIUM ) 40 MG capsule TAKE ONE CAPSULE BY MOUTH TWO TIMES DAILY BEFORE A MEAL 60 capsule 6   famotidine  (PEPCID ) 20 MG tablet TAKE 1 TABLET(20 MG) BY MOUTH AT BEDTIME 90 tablet 3   Vitamin D , Ergocalciferol , (DRISDOL ) 1.25 MG (50000 UNIT) CAPS capsule Take 1 capsule (50,000 Units total) by mouth every 7 (seven) days. (Patient not taking: Reported on 06/07/2024) 13 capsule 1   No current facility-administered medications for this visit.     Objective:  BP 130/72 (BP Location: Left Arm, Patient Position: Sitting, Cuff Size: Normal)   Pulse 80   Temp 98.2 F (36.8 C) (Temporal)   Ht 5' 5 (1.651 m)   Wt 210 lb 12.8 oz (95.6 kg)   SpO2 93%   BMI 35.08 kg/m  Gen: NAD, resting comfortably CV: RRR no murmurs rubs or gallops Lungs: CTAB no crackles, wheeze, rhonchi Ext: no edema Skin: warm, dry     Assessment and Plan    #breast cancer 03/15/2020 -remains on Arimidex  and doing well -doesn't qualify for lung cancer screening with breast cancer history  #hypertension S: medication: Amlodipine  2.5 mg BP Readings from Last 3 Encounters:  06/07/24 130/72  12/04/23 136/78  09/03/23 (!) 130/52  A/P: stable- continue current medicines   #hyperlipidemia S: Medication:Atorvastatin  40 mg Lab Results  Component Value Date   CHOL 169 05/20/2023   HDL 43.70 05/20/2023   LDLCALC 91 05/20/2023   LDLDIRECT 190.0 12/18/2017   TRIG 170.0 (H) 05/20/2023   CHOLHDL 4 05/20/2023  A/P: Hyperlipidemia-pretty close to ideal goal- continue current medications   # Anxiety S:Medication: Citalopram  20 mg    06/07/2024    2:07 PM 06/10/2023    2:40 PM 05/20/2023    2:37 PM  Depression screen PHQ 2/9  Decreased Interest 0 0 0  Down, Depressed, Hopeless 0 0 0  PHQ - 2 Score 0 0 0  Altered sleeping 0 0 1  Tired, decreased energy 0 0 2  Change in appetite 0 0 0  Feeling bad or failure about yourself  0 0 0  Trouble concentrating 0 0 0  Moving slowly or  fidgety/restless 0 0 0  Suicidal thoughts 0 0 0  PHQ-9 Score 0 0  3   Difficult doing work/chores Not difficult at all Not difficult at all Somewhat difficult     Data saved with a previous flowsheet row definition       06/07/2024    2:07 PM 05/20/2023    2:37 PM  GAD 7 : Generalized Anxiety Score  Nervous, Anxious, on Edge 0 0  Control/stop worrying 0 0  Worry too much - different things 0 0  Trouble relaxing 0 0  Restless 0 0  Easily annoyed or irritable 0 0  Afraid - awful might happen 0   Total GAD 7 Score 0   Anxiety Difficulty Not difficult at all Not difficult at all  A/P: reasonable control- continue current medications    # Emphysema-on Anoro (when affordable but very costly) with albuterol  available if needed   # GERD-sees Dr. Nandigam S:Medication: Nexium  40 mg daily with Pepcid  available as needed A/P: reasonable control continue current medications    #Vitamin D  deficiency S: Medication: off right now -Has required high-dose vitamin D  in the past        A/P: she finished high dose last year but has been off- this could be low again- update labs and likely need of the refill the vitamin D    # B12 deficiency S: Current treatment/medication (oral vs. IM): none  Lab Results  Component Value Date   VITAMINB12 317 05/20/2023  A/P: B12 low normal- last year we mentioned could do 1000 mcg B12 daily for amonth then weekly- we may need to push for this again   # Fatty liver-incidental finding on prior chest CT- losing weight  # Osteoporosis-[facility].  Fosamax not ideal with reflux.  Reclast severe muscle and joint pain for 4 days in the past.  Considering Prolia but we have still yet to get records- she reports they have not started anything- was referred to osteoporosis clinic - she's also discussed with Dr. Odean- shed prefer we manage it.    # Smoking-1 pack/day-encouraged cessation but she is not ready to quit   # Morbid obesity-BMI  over 35 with hypertension, hyperlipidemia, fatty liver-discussed need for healthy eating/show exercise/weight loss.  Congratulated her on 6 pounds down already! What's she liked over time has changed and made this somewhat easier. For instance no taste for hot dogs and used to really enjoy.  Wt Readings from Last 3 Encounters:  06/07/24 210 lb 12.8 oz (95.6 kg)  12/04/23 216 lb 8 oz (98.2 kg)  09/03/23  219 lb (99.3 kg)   #urinary incontinence- issues with coughing, sneezing or getting out of bed in morning. . Can have slight fecal issues as well - does have some frequency and odor and wants to make sure no infection  Recommended follow up: Return in about 6 months (around 12/05/2024) for followup or sooner if needed.Schedule b4 you leave. Future Appointments  Date Time Provider Department Center  09/07/2024 11:45 AM Odean Potts, MD [facility] None    Lab/Order associations: fasting other than lemonade   ICD-10-CM   1. Hypertension, unspecified type  I10 Comprehensive metabolic panel with GFR    CBC with Differential/Platelet    2. Hyperlipidemia, unspecified hyperlipidemia type  E78.5 Comprehensive metabolic panel with GFR    CBC with Differential/Platelet    Lipid panel    3. Obesity, morbid (HCC)  E66.01     4. Screening for diabetes mellitus  Z13.1 Hemoglobin A1c    5. Hyperglycemia  R73.9 Hemoglobin A1c    6. Osteoporosis, unspecified osteoporosis type, unspecified pathological fracture presence  M81.0 VITAMIN D  25 Hydroxy (Vit-D Deficiency, Fractures)    7. TOBACCO ABUSE  F17.200 Urinalysis, Routine w reflex microscopic      No orders of the defined types were placed in this encounter.   Return precautions advised.  Garnette Lukes, MD

## 2024-06-08 ENCOUNTER — Ambulatory Visit: Payer: Self-pay | Admitting: Family Medicine

## 2024-06-08 LAB — COMPREHENSIVE METABOLIC PANEL WITH GFR
ALT: 13 U/L (ref 0–35)
AST: 14 U/L (ref 0–37)
Albumin: 4.4 g/dL (ref 3.5–5.2)
Alkaline Phosphatase: 137 U/L — ABNORMAL HIGH (ref 39–117)
BUN: 8 mg/dL (ref 6–23)
CO2: 31 meq/L (ref 19–32)
Calcium: 10 mg/dL (ref 8.4–10.5)
Chloride: 102 meq/L (ref 96–112)
Creatinine, Ser: 0.72 mg/dL (ref 0.40–1.20)
GFR: 86.69 mL/min (ref >60.00)
Glucose, Bld: 102 mg/dL — ABNORMAL HIGH (ref 70–99)
Potassium: 4.9 meq/L (ref 3.5–5.1)
Sodium: 141 meq/L (ref 135–145)
Total Bilirubin: 0.5 mg/dL (ref 0.2–1.2)
Total Protein: 7.6 g/dL (ref 6.0–8.3)

## 2024-06-08 LAB — URINALYSIS, ROUTINE W REFLEX MICROSCOPIC
Bilirubin Urine: NEGATIVE
Hgb urine dipstick: NEGATIVE
Ketones, ur: NEGATIVE
Leukocytes,Ua: NEGATIVE
Nitrite: NEGATIVE
RBC / HPF: NONE SEEN (ref 0–?)
Specific Gravity, Urine: 1.015 (ref 1.000–1.030)
Total Protein, Urine: NEGATIVE
Urine Glucose: NEGATIVE
Urobilinogen, UA: 0.2 (ref 0.0–1.0)
pH: 8 (ref 5.0–8.0)

## 2024-06-08 LAB — CBC WITH DIFFERENTIAL/PLATELET
Basophils Absolute: 0.1 K/uL (ref 0.0–0.1)
Basophils Relative: 1.3 % (ref 0.0–3.0)
Eosinophils Absolute: 0.2 K/uL (ref 0.0–0.7)
Eosinophils Relative: 1.7 % (ref 0.0–5.0)
HCT: 43.3 % (ref 36.0–46.0)
Hemoglobin: 14.4 g/dL (ref 12.0–15.0)
Lymphocytes Relative: 14.8 % (ref 12.0–46.0)
Lymphs Abs: 1.3 K/uL (ref 0.7–4.0)
MCHC: 33.3 g/dL (ref 30.0–36.0)
MCV: 90.4 fl (ref 78.0–100.0)
Monocytes Absolute: 0.5 K/uL (ref 0.1–1.0)
Monocytes Relative: 5.5 % (ref 3.0–12.0)
Neutro Abs: 6.8 K/uL (ref 1.4–7.7)
Neutrophils Relative %: 76.7 % (ref 43.0–77.0)
Platelets: 232 K/uL (ref 150.0–400.0)
RBC: 4.79 Mil/uL (ref 3.87–5.11)
RDW: 14.7 % (ref 11.5–15.5)
WBC: 8.9 K/uL (ref 4.0–10.5)

## 2024-06-08 LAB — LIPID PANEL
Cholesterol: 149 mg/dL (ref 0–200)
HDL: 42.1 mg/dL (ref >39.00)
LDL Cholesterol: 75 mg/dL (ref 0–99)
NonHDL: 106.57
Total CHOL/HDL Ratio: 4
Triglycerides: 157 mg/dL — ABNORMAL HIGH (ref 0.0–149.0)
VLDL: 31.4 mg/dL (ref 0.0–40.0)

## 2024-06-08 LAB — HEMOGLOBIN A1C: Hgb A1c MFr Bld: 5.8 % (ref 4.6–6.5)

## 2024-06-08 LAB — VITAMIN D 25 HYDROXY (VIT D DEFICIENCY, FRACTURES): VITD: 19.09 ng/mL — ABNORMAL LOW (ref 30.00–100.00)

## 2024-06-08 MED ORDER — VITAMIN D (ERGOCALCIFEROL) 1.25 MG (50000 UNIT) PO CAPS: 50000.0000 [IU] | ORAL_CAPSULE | ORAL | 1 refills | Status: DC

## 2024-06-09 LAB — URINE CULTURE
MICRO NUMBER:: 17275228
Result:: NO GROWTH
SPECIMEN QUALITY:: ADEQUATE

## 2024-07-01 NOTE — Progress Notes (Signed)
 Received bone density report from [facility], Dr. Odean will review w/ pt at next appt in February. Scanned results HMI.

## 2024-07-19 ENCOUNTER — Telehealth: Payer: Self-pay | Admitting: Family Medicine

## 2024-07-19 ENCOUNTER — Other Ambulatory Visit: Payer: Self-pay

## 2024-07-19 DIAGNOSIS — F4323 Adjustment disorder with mixed anxiety and depressed mood: Secondary | ICD-10-CM

## 2024-07-19 MED ORDER — ATORVASTATIN CALCIUM 40 MG PO TABS: 40.0000 mg | ORAL_TABLET | Freq: Every day | ORAL | 1 refills | Status: AC

## 2024-07-19 MED ORDER — ANASTROZOLE 1 MG PO TABS: 1.0000 mg | ORAL_TABLET | Freq: Every day | ORAL | 3 refills | Status: AC

## 2024-07-19 MED ORDER — CITALOPRAM HYDROBROMIDE 20 MG PO TABS: 20.0000 mg | ORAL_TABLET | Freq: Every day | ORAL | 1 refills | Status: AC

## 2024-07-19 MED ORDER — AMLODIPINE BESYLATE 2.5 MG PO TABS: 2.5000 mg | ORAL_TABLET | Freq: Every day | ORAL | 2 refills | Status: DC

## 2024-07-19 MED ORDER — AMLODIPINE BESYLATE 2.5 MG PO TABS: 2.5000 mg | ORAL_TABLET | Freq: Every day | ORAL | 2 refills | Status: AC

## 2024-07-19 MED ORDER — ESOMEPRAZOLE MAGNESIUM 40 MG PO CPDR: 40.0000 mg | DELAYED_RELEASE_CAPSULE | Freq: Two times a day (BID) | ORAL | 6 refills | Status: DC

## 2024-07-19 MED ORDER — VITAMIN D (ERGOCALCIFEROL) 1.25 MG (50000 UNIT) PO CAPS: 50000.0000 [IU] | ORAL_CAPSULE | ORAL | 1 refills | Status: AC

## 2024-07-19 MED ORDER — CITALOPRAM HYDROBROMIDE 20 MG PO TABS: 20.0000 mg | ORAL_TABLET | Freq: Every day | ORAL | 1 refills | Status: DC

## 2024-07-19 MED ORDER — ATORVASTATIN CALCIUM 40 MG PO TABS: 40.0000 mg | ORAL_TABLET | Freq: Every day | ORAL | 1 refills | Status: DC

## 2024-07-19 NOTE — Telephone Encounter (Signed)
 Refill sent to [facility].   Copied from CRM [id number]. Topic: Clinical - Prescription Issue >> Jul 19, 2024  1:30 PM China J wrote: Reason for CRM: [name] medication benefit called us  in order to transfer all of her medications (amlodipine , atorvastatin , citalopram , esomepazole, anastrozole , and vitamin d ) to a new pharmacy but the medications were sent to the wrong pharmacy. She also said that her anastrozole  (ARIMIDEX ) 1 MG tablet and Vitamin D , Ergocalciferol , (DRISDOL ) 1.25 MG (50000 UNIT) CAPS capsule was left out of the transfer.   They are needing the medications sent to [facility].  Please call the patient once this transfer if complete as she would like to be in the loop of what's going on. >> Jul 19, 2024  3:49 PM Rea ORN wrote: Pt called back to advise esomeprazole  (NEXIUM ) 40 MG capsule was not sent to [facility]. It was sent to [facility]. The pt is asking for PCP to sent esomeprazole  (NEXIUM ) 40 MG capsule to [facility] and call her back when it is switched. Please call back to advise, [phone]

## 2024-07-19 NOTE — Telephone Encounter (Signed)
 Patient aware refills sent to [facility]  Copied from CRM [id number]. Topic: Clinical - Prescription Issue >> Jul 19, 2024  1:30 PM [name] [name] wrote: Reason for CRM: [name] medication benefit called us  in order to transfer all of her medications (amlodipine , atorvastatin , citalopram , esomepazole, anastrozole , and vitamin d ) to a new pharmacy but the medications were sent to the wrong pharmacy. She also said that her anastrozole  (ARIMIDEX ) 1 MG tablet and Vitamin D , Ergocalciferol , (DRISDOL ) 1.25 MG (50000 UNIT) CAPS capsule was left out of the transfer.   They are needing the medications sent to [facility].  Please call the patient once this transfer if complete as she would like to be in the loop of what's going on.

## 2024-07-29 ENCOUNTER — Other Ambulatory Visit: Payer: Self-pay | Admitting: Family Medicine

## 2024-07-29 DIAGNOSIS — F4323 Adjustment disorder with mixed anxiety and depressed mood: Secondary | ICD-10-CM

## 2024-08-14 ENCOUNTER — Other Ambulatory Visit: Payer: Self-pay | Admitting: Family Medicine

## 2024-08-17 ENCOUNTER — Telehealth: Payer: Self-pay | Admitting: Hematology and Oncology

## 2024-08-17 NOTE — Telephone Encounter (Signed)
 I spoke with patient and she is aware of rescheduled MD appointment to 09/16/2024.

## 2024-09-07 ENCOUNTER — Inpatient Hospital Stay: Payer: Medicare Other | Admitting: Hematology and Oncology

## 2024-09-16 ENCOUNTER — Inpatient Hospital Stay: Admitting: Hematology and Oncology

## 2024-12-07 ENCOUNTER — Ambulatory Visit: Admitting: Family Medicine
